# Patient Record
Sex: Male | Born: 1940
Health system: Southern US, Community
[De-identification: ages and names within clinical notes are randomized; demographics above are authoritative.]

## PROBLEM LIST (undated history)

## (undated) DIAGNOSIS — N529 Male erectile dysfunction, unspecified: Secondary | ICD-10-CM

## (undated) DIAGNOSIS — E119 Type 2 diabetes mellitus without complications: Secondary | ICD-10-CM

## (undated) DIAGNOSIS — E785 Hyperlipidemia, unspecified: Secondary | ICD-10-CM

## (undated) DIAGNOSIS — I10 Essential (primary) hypertension: Secondary | ICD-10-CM

## (undated) DIAGNOSIS — H409 Unspecified glaucoma: Secondary | ICD-10-CM

## (undated) HISTORY — DX: Male erectile dysfunction, unspecified: N52.9

## (undated) HISTORY — DX: Unspecified glaucoma: H40.9

## (undated) HISTORY — DX: Hyperlipidemia, unspecified: E78.5

## (undated) HISTORY — DX: Type 2 diabetes mellitus without complications: E11.9

## (undated) HISTORY — PX: LEG AMPUTATION ABOVE KNEE: SHX117

## (undated) HISTORY — DX: Essential (primary) hypertension: I10

---

## 1999-10-04 ENCOUNTER — Encounter (INDEPENDENT_AMBULATORY_CARE_PROVIDER_SITE_OTHER): Payer: Self-pay

## 1999-10-04 ENCOUNTER — Inpatient Hospital Stay (HOSPITAL_COMMUNITY): Admission: EM | Admit: 1999-10-04 | Discharge: 1999-10-22 | Payer: Self-pay | Admitting: Internal Medicine

## 1999-10-04 ENCOUNTER — Encounter: Payer: Self-pay | Admitting: Internal Medicine

## 1999-10-13 ENCOUNTER — Encounter: Payer: Self-pay | Admitting: Internal Medicine

## 1999-11-29 ENCOUNTER — Encounter
Admission: RE | Admit: 1999-11-29 | Discharge: 2000-02-22 | Payer: Self-pay | Admitting: Physical Medicine and Rehabilitation

## 2001-09-05 ENCOUNTER — Encounter (HOSPITAL_BASED_OUTPATIENT_CLINIC_OR_DEPARTMENT_OTHER): Admission: RE | Admit: 2001-09-05 | Discharge: 2001-12-04 | Payer: Self-pay | Admitting: Internal Medicine

## 2001-09-18 ENCOUNTER — Encounter: Payer: Self-pay | Admitting: *Deleted

## 2001-09-19 ENCOUNTER — Ambulatory Visit (HOSPITAL_COMMUNITY): Admission: RE | Admit: 2001-09-19 | Discharge: 2001-09-19 | Payer: Self-pay | Admitting: *Deleted

## 2001-09-27 ENCOUNTER — Encounter (INDEPENDENT_AMBULATORY_CARE_PROVIDER_SITE_OTHER): Payer: Self-pay | Admitting: Specialist

## 2001-09-27 ENCOUNTER — Encounter: Payer: Self-pay | Admitting: Vascular Surgery

## 2001-09-27 ENCOUNTER — Inpatient Hospital Stay (HOSPITAL_COMMUNITY): Admission: RE | Admit: 2001-09-27 | Discharge: 2001-10-06 | Payer: Self-pay | Admitting: Vascular Surgery

## 2001-10-02 ENCOUNTER — Encounter: Payer: Self-pay | Admitting: Vascular Surgery

## 2001-10-10 ENCOUNTER — Inpatient Hospital Stay (HOSPITAL_COMMUNITY): Admission: EM | Admit: 2001-10-10 | Discharge: 2001-10-18 | Payer: Self-pay | Admitting: Emergency Medicine

## 2001-10-10 ENCOUNTER — Encounter (INDEPENDENT_AMBULATORY_CARE_PROVIDER_SITE_OTHER): Payer: Self-pay | Admitting: *Deleted

## 2001-10-13 ENCOUNTER — Encounter: Payer: Self-pay | Admitting: Vascular Surgery

## 2002-04-08 ENCOUNTER — Encounter: Admission: RE | Admit: 2002-04-08 | Discharge: 2002-07-07 | Payer: Self-pay | Admitting: Vascular Surgery

## 2002-07-08 ENCOUNTER — Encounter: Admission: RE | Admit: 2002-07-08 | Discharge: 2002-09-03 | Payer: Self-pay | Admitting: Vascular Surgery

## 2009-01-19 ENCOUNTER — Ambulatory Visit: Payer: Self-pay | Admitting: Vascular Surgery

## 2010-10-04 NOTE — Consult Note (Signed)
NEW PATIENT CONSULTATION   Scott Reynolds, Scott Reynolds  DOB:  [birth year]                                       01/19/2009  ZOXWR#:[id number]   The patient is a 70 year old male patient well known to me having  previously undergone bilateral below knee amputations most recently on  the right side in 2003.  He was fit with bilateral prostheses which he  has worn over the years but now they are not fitted properly because of  a decreased size in his stumps and he needs adjustments made.  He denies  any neurologic symptoms as hemiparesis, aphasia, amaurosis fugax,  diplopia, blurred vision or syncope.  His diabetes mellitus has been  under good control by Dr. Lucianne Muss.  He also denies any cardiac symptoms  such as chest pain or dyspnea on exertion.   PHYSICAL EXAMINATION:  Blood pressure 166/74, heart rate 72,  respirations 14.  Carotid pulses are 3+.  No audible bruits.  Chest  clear to auscultation.  Cardiovascular exam regular rhythm.  No murmurs.  Lower extremity exam reveals 3+ femoral pulses but no popliteal or  distal pulses.  Both below knee amputation stumps are well-healed with  no evidence of flexion contracture and no skin breakdown.   He does need new below knee prostheses or adjustments and we will refer  him back to [facility] where he was fitted previously.  He will return to  see [place] on a p.r.n. basis.   Quita Skye Hart Rochester, M.D.  Electronically Signed   JDL/[id number]  D:  01/19/2009  T:  01/20/2009  Job:  [id number]

## 2010-10-07 NOTE — H&P (Signed)
[place]  Patient:    Scott Reynolds, Scott Reynolds Visit Number: [id number] MRN: [id number]          Service Type: MED Location: [phone] Attending Physician:  Caralee Ates Dictated by:   Tollie Pizza Collins, P.A.-C. Admit Date:  10/10/2001                           History and Physical  CHIEF COMPLAINT: Infected foot.  HISTORY OF PRESENT ILLNESS: The patient is a 70 year old white male, who is status post a right femoropopliteal bypass graft with a first toe amputation on Sep 27, 2001.  He did well postoperatively from the standpoint of his revascularization; however, his toe amputation site failed to heal and ultimately he required transmetatarsal amputation of the great toe, which was performed on Oct 02, 2001.  Following this procedure he recovered nicely and was discharged home on Oct 06, 2001.  He had done well since that time until yesterday, at which time he noted significant swelling and odor at the amputation site during his dressing change.  He states that his foot was quite painful and there was a moderate amount of foul smelling drainage coming from the site.  He denied any fever.  He was seen by the home health nurse and it was determined that he should be seen by a physician.  By the time this determination was made the CV/TS office was closed and therefore the patient presented to the ED for further evaluation and treatment.  PAST MEDICAL HISTORY:  1. Type 2 non-insulin dependent diabetes mellitus.  2. Hypertension.  3. Glaucoma.  4. Peripheral vascular disease.  5. Hyperlipidemia.  PAST SURGICAL HISTORY:  1. Right femoropopliteal blood pressure using nonreverse saphenous vein     on Sep 27, 2001 by Dr. Hart Rochester.  2. Right great toe amputation on Sep 27, 2001 by Dr. Hart Rochester.  3. Right transmetatarsal amputation of the great toe on Oct 02, 2001 by     Dr. Hart Rochester.  4. Left below-the-knee amputation in May 2001.  5. History of left eye  surgery of unknown type in the 1990s.  CURRENT MEDICATIONS:  1. Hydrochlorothiazide 25 mg q.d.  2. Amaryl 2 mg q.d.  3. Actos 45 mg q.d.  4. Lipitor 10 mg q.d.  5. Xalatan eye drops one drop in each eye q.h.s.  6. Cosopt one drop in each eye q.h.s.  7. Tylox one to two q.4h p.r.n. pain.  ALLERGIES: No known drug allergies.  FAMILY HISTORY: His mother died of coronary artery disease.  His father is also deceased and had Alzheimers disease.  He has one living brother who has diabetes mellitus.  SOCIAL HISTORY: He is married and has one son, who accompanies him to the ER today.  He denies any past or current history of alcohol or tobacco use.  REVIEW OF SYSTEMS: See History of Present Illness for pertinent positives. Also, he is diabetic and sugars stay fairly well controlled with oral hypoglycemic medications.  He is also status post a previous left below-the-knee amputation and ambulates with a prosthesis.  He denies weight loss, fever, chills, chest pain, shortness of breath, palpitations, dyspnea on exertion, cough, abdominal pain, nausea, vomiting, diarrhea, constipation, lower extremity edema other than of the foot which was noted yesterday, TIA symptoms, weakness, anorexia, anxiety, or depression.  PHYSICAL EXAMINATION:  VITAL SIGNS: Blood pressure 138/71, heart rate 100 (sinus tachycardia), temperature 98.1 degrees, respirations 18 and  unlabored.  GENERAL: This is a well-developed, well-nourished white male in no acute distress.  HEENT: Normocephalic, atraumatic.  PERRLA.  Slight irregularity of the left eye secondary to his prior surgery.  Examination of the external ears and nose revealed no abnormalities.  Oropharynx is clear with moist mucous membranes.  NECK: Supple without lymphadenopathy, thyromegaly, or carotid bruits.  HEART: Regular rate and rhythm without murmurs, rubs, or gallops.  LUNGS: Clear to auscultation.  ABDOMEN: Soft, nontender, nondistended,  with active bowel sounds in all quadrants.  No hepatosplenomegaly.  EXTREMITIES: He has a well-healed left below-the-knee amputation stump.  His right leg incisions are intact and dry, with staples present.  No erythema or drainage.  His right forefoot is warm to touch with mild erythema.  He has 1-2+ pedal edema.  His right transmetatarsal amputation site of the great toe is boggy to touch with soupy drainage.  There is a foul odor present.  The skin edges appear necrotic and are healing poorly.  The area is not tender and he does have intact sensation and motor function.  There is a strong dorsalis pedis Doppler signal as well as palpable femoral pulse.  LABORATORY DATA: CBC and basic metabolic panel are pending, although his WBC has returned and is 11,000.  ASSESSMENT/PLAN: This is a 70 year old white male, with a history of peripheral vascular disease and diabetes mellitus.  He is status post a recent right transmetatarsal amputation.  He now presents with a nonhealing infected amputation site.  He will be admitted for intravenous antibiotics and local wound care. Dictated by:   Tollie Pizza Collins, P.A.-C. Attending Physician:  Caralee Ates. DD:  10/10/01 TD:  10/11/01 Job: [id number]

## 2010-10-07 NOTE — Discharge Summary (Signed)
[place]. [facility]  Patient:    Scott Reynolds, Scott Reynolds Visit Number: [id number] MRN: [id number]          Service Type: SUR Location: 2000 2019 01 Attending Physician:  Colvin Caroli Dictated by:   Adair Patter, P.A. Admit Date:  09/27/2001 Discharge Date: 10/06/2001                             Discharge Summary  DATE OF BIRTH:  January 18, 1931  ADMITTING DIAGNOSIS:  Peripheral vascular disease.  SECONDARY DIAGNOSIS:  Non-insulin-dependent diabetes mellitus.  DISCHARGE DIAGNOSIS:  Peripheral vascular disease.  HOSPITAL COURSE:  Mr. Dorko was admitted to [facility] on Sep 27, 2001 secondary to severe peripheral vascular disease.  On this date, he underwent a right femoral-to-below-knee popliteal bypass graft and amputation of right great toe.  No complications were noted during procedure. Postoperatively, the patient had an ankle brachial index performed on his right side which revealed he had improved blood flow to his right lower extremity.  His amputation site remained clean and healing well.  He had a Penrose drain in place, which was removed on postoperative day #2.  His incisions were healing well.  He began ambulating with physical therapy and was subsequently deemed stable for discharge home on Oct 06, 2001.  MEDICATIONS AT TIME OF DISCHARGE: 1. Tylox one to two tablets every four to six hours as needed for pain. 2. Hydrochlorothiazide 25 mg one daily. 3. Amaryl 2 mg one daily. 4. Xalatan eye drops. 5. Cosopt eye drops. 6. Actos 45 mg one daily. 7. Lipitor 10 mg one daily.  ACTIVITY:  The patient was to avoid driving, strenuous activity or lifting heavy objects.  He is told to walk daily and continue breathing exercises.  DIET DIET:  An 1800-calorie ADA diet.  WOUND CARE:  The patient was told he could shower and clean his incision with soap and water.  He will have a visiting nurse come to his home to help with dressing  changes.  DISPOSITION:  Home.  FOLLOWUP:  The patient will see Dr. Quita Skye. Hart Rochester in about two weeks; he will also call and verify the time of this appointment. Dictated by:   Adair Patter, P.A. Attending Physician:  Colvin Caroli DD:  10/05/01 TD:  10/08/01 Job: [id number]

## 2010-10-07 NOTE — H&P (Signed)
[place]. [facility]  Patient:    Scott Reynolds, Scott Reynolds Visit Number: [id number] MRN: [id number]          Service Type: FTC Location: FOOT Attending Physician:  Sharren Bridge Dictated by:   Dominica Severin, P.A. Admit Date:  09/05/2001   CC:         CVTS Office  Robert E. Cheryll Cockayne, M.D.   History and Physical  DATE OF BIRTH:  29-Feb-1941.  CHIEF COMPLAINT: Right peripheral vascular occlusive disease and gangrene of the right first toe.  HISTORY OF PRESENT ILLNESS:  This is a 70 year old African-American male with non-insulin dependent diabetes mellitus with a history of peripheral vascular occlusive disease status post left below knee amputation in May 2001.  In December 2002, he had developed right lower extremity sores on the toes which have not healed.  It has been treated with Accuzyme. There is no evidence of infection but it has not been healing properly and angiogram was done and confirms the diagnosis for peripheral vascular occlusive disease.  He is scheduled for a right femoral popliteal bypass graft on Sep 27, 2001.  The patient denies any buttock, hip, thigh or calf pain.  He does describe some foot pain.  He denies any rest pain although he does have some night pain occasionally.  He does have some slow healing ulcers.  He has a gangrenous right great toe and some ischemic changes.  He does have some occasional peripheral edema and denies any decrease in temperature in the foot.  He denies any shortness of breath or dyspnea with exertion after walking.  He denies any chest pain, palpitations or coronary artery disease.  PAST MEDICAL HISTORY: 1. Peripheral vascular occlusive disease. 2. Hypertension. 3. Glaucoma. 4. Type 2 diabetes mellitus.  PAST SURGICAL HISTORY: 1. A left below the knee amputation May 2001. 2. Left eye surgery in 1990s.  MEDICATIONS: 1. Amaryl 2 mg p.o. q. daily. 2. Glucophage 500 mg p.o. q. daily. 3.  Lipitor 40 mg p.o. q. daily. 4. Hydrochlorothiazide 25 mg p.o. q. day. 5. Xalatan eye drops 1 drop both eyes q.h.s. 6. Cosopt 1 drop both eyes q.h.s. 7. Actos 45 mg p.o. q. daily.  NOTE:  These medication list needs to be confirmed at the time of admission secondary to patient did not bring his accurate list with him to the office today.  ALLERGIES:  No known drug allergies.  REVIEW OF SYSTEMS:  Please see HPI for significant positives, otherwise patient denies any history of chronic obstructive pulmonary disease, DVT, pulmonary embolism, thrombophlebitis, diabetes, kidney disease, asthma.  He denies any history of amaurosis fugax, cancer, coronary artery disease, congestive heart failure, arrhythmias, stroke or TIAs.  FAMILY HISTORY:  The patients mother is deceased age 24 and father is deceased in his 39s from Alzheimers.  Mother is deceased from coronary artery disease.  He has a brother who has diabetes mellitus.  SOCIAL HISTORY: The patient is married for 41 years.  Has 1 son, lives in [place].  He is retired.  He used to work in trucking and loading.  Denies any alcohol or tobacco use ever.  PHYSICAL EXAMINATION:  VITAL SIGNS: Blood pressure 136/70, pulse 72, respirations 16.  GENERAL:  He is a 70 year old African-American male who is in no acute distress.  He is alert, oriented x 3.  HEENT:  Head is normocephalic and atraumatic.  Eyes:  Pupils, equal, round, reactive to light and accommodation, EOMI without cataracts, glaucoma or macular degeneration.  NECK:  Supple without JVD, bruits or lymphadenopathy.  CHEST:  Symmetrical on inspiration.  LUNGS:  Without wheezes, rhonchi, rales or lymphadenopathy.  HEART:  Regular rate and rhythm without murmurs, gallops, or rubs.  ABDOMEN:  Soft, nontender, with positive bowel sounds x 4 quadrants without masses or bruits.  GENITOURINARY AND RECTAL: Deferred.  EXTREMITIES:  Without clubbing, cyanosis, or edema.  He has a  left lower extremity with a healed stump and a functional prosthesis.  There is no cellulitis.  He does have ulcerations on his right lower extremity on the lateral aspect which is eschar measuring 5 x 2 cm and dry gangrene of his right first toe.  Temperature is decreased slightly.  PULSES:  Carotids are 3+ bilaterally, femoral 3+ bilaterally.  There is no popliteal or pedal pulses bilaterally palpated at this time.  NEUROLOGIC:  There is no focal deficits.  He has a somewhat unsteady gait secondary to his prosthesis.  Deep tendon reflexes and muscle strength are 2+ bilaterally.  ASSESSMENT AND PLAN:  Right peripheral vascular occlusive disease for a right fem-pop bypass graft with saphenous vein on Sep 27, 2001, as well as a possible right great toe amputation to be determined on that morning by Dr. Hart Rochester. Dr. Hart Rochester has seen and evaluated this patient prior to this admission and has explained the risks and benefits of the procedure and the patient has agreed to continue. Dictated by:   Dominica Severin, P.A. Attending Physician:  Sharren Bridge DD:  09/25/01 TD:  09/26/01 Job: [id number]

## 2010-10-07 NOTE — Procedures (Signed)
[place]. [facility]  Patient:    Scott Reynolds, Scott Reynolds Visit Number: [id number] MRN: [id number]          Service Type: DSU Location: [facility] 2899 [id number] Attending Physician:  Caralee Ates Dictated by:   Caralee Ates, M.D. Proc. Date: 09/19/01 Admit Date:  09/19/2001 Discharge Date: 09/19/2001   CC:         CVTS Office  [facility] Lab - 6th Floor   Procedure Report  PREOPERATIVE DIAGNOSIS: Right lower extremity ischemia.  PROCEDURE: Right lower extremity angiogram.  SURGEON: Caralee Ates, M.D.  ACCESS: Right common femoral artery (#5 French sheath).  TOTAL CONTRAST: 130 cc of Isopaque.  TOTAL FLUOROSCOPY TIME: 5 minutes and 18 seconds.  COMPLICATIONS: None.  DESCRIPTION OF PROCEDURE: The patient was brought to the cath lab and placed in the cath lab in supine position. Following adequate IV sedation the groins were prepped and draped in sterile fashion. The right femoral head was located under fluoroscopy and the overlying skin was anesthetized with 1% Lidocaine. A small stab incision was made. A tract was developed down to the level of the common femoral artery bluntly. The right common femoral artery was then percutaneously punctured and a #5 French sheath placed. Retrograde injection of contrast to the sheath was used to opacify the iliac system and then serial images of the right lower extremity were obtained from the groin to the foot. Once satisfactory images had been obtained the procedure was terminated and the sheath was removed and direct pressure held until hemostasis was achieved. The patient was then transferred to the recovery area in stable condition. The patient tolerated the procedure well. There were no complications.  FINDINGS: 1. The common internal and external iliac arteries are widely patent with no    evidence of atherosclerotic narrowing. 2. The SFA and profunda are widely patent at their origins. However, the SFA    is occluded at  [facility] canal. 3. Popliteal artery reconstitutes just above the level of the knee and is    patent throughout the remainder of its course. 4. There is single vessel runoff to the foot via the peroneal artery. 5. The posterior tibial artery reconstitutes at the level of the ankle through    collaterals from the peroneal.  RECOMMENDATIONS: 1. Routine post cath care. 2. Will discuss the findings of this study with Dr. Hart Rochester, who is tentatively    planning for a femoral popliteal bypass next week for limb salvage. Dictated by:   Caralee Ates, M.D. Attending Physician:  Caralee Ates. DD:  09/19/01 TD:  09/22/01 Job: [id number]

## 2010-10-07 NOTE — Op Note (Signed)
[place]. [facility]  Patient:    Scott Reynolds, Scott Reynolds Visit Number: [id number] MRN: [id number]          Service Type: FTC Location: FOOT Attending Physician:  Sharren Bridge Dictated by:   Quita Skye Hart Rochester, M.D. Proc. Date: 09/26/01 Admit Date:  09/05/2001                             Operative Report  PREOPERATIVE DIAGNOSIS: Right femoropopliteal and tibial occlusive disease with gangrene of first toe, right foot.  POSTOPERATIVE DIAGNOSIS: Right femoropopliteal and tibial occlusive disease with gangrene of first toe, right foot.  OPERATION/PROCEDURE:  1. Right proximal superficial femoral to below-the-knee popliteal bypass     using a nonreverse translocated saphenous vein graft from right leg with     intraoperative arteriogram.  2. Right first toe amputation.  SURGEON: Quita Skye. Hart Rochester, M.D.  FIRST ASSISTANT: Di Kindle. Edilia Bo, M.D.  SECOND ASSISTANT: Loura Pardon, P.A.  ANESTHESIA: General endotracheal.  PROCEDURE: The patient was taken to the operating room and placed in the supine position, at which time satisfactory general endotracheal anesthesia was administered.  The right leg was prepped with Betadine scrubbing solution and draped in routine sterile manner, isolating the foot in a sterile isolation bag.  The saphenous vein was exposed at the saphenofemoral junction and removed down to the mid calf through multiple incisions along the medial aspect of the right leg.  Its branches were ligated with 4-0 and 5-0 silk ties and divided.  It was removed, gently dilated with heparinized saline, and marked for orientation purposes.  It was an adequate vein, being about 2.5-3 mm in size in its smallest areas.  The popliteal artery was exposed in a below-the-knee position where it was fairly soft vessel with some mild disease distally and no palpable pulse.  The superficial femoral artery was then exposed and through the proximal vein  harvesting incision about 8 cm distal to its origin, where it was widely patent and had an excellent pulse.  There was some mild disease.  A subfascial anatomic tunnel was created and the patient was heparinized.  The superficial femoral artery was then occluded proximally and distally, opened with a 15 blade, extended with Potts scissors.  The proximal end of the vein was spatulated and anastomosed end-to-side using continuous 6-0 Prolene.  The clamps were then released and there was an excellent pulse down to the first set of competent valves.  Using a retrograde valvulotome the valves were rendered incompetent with resultant excellent flow out of the distal end of the vein graft.  The vein was carefully delivered through the tunnel, the popliteal artery opened in a below-the-knee position with a 15 blade, extended with the Potts scissors, had good backbleeding. The vein was carefully measured and spatulated and anastomosed end-to-side using continuous 6-0 Prolene.  The vessel loops were then released and there was an excellent pulse in the vein graft and good Doppler flow. Intraoperative arteriogram revealed a widely patent distal anastomosis with runoff through the peroneal artery to the ankle.  The anterior tibial artery was occluded in the mid calf and the posterior tibial artery was totally occluded.  Adequate hemostasis was achieved.  After giving Protamine the wounds were closed with Vicryl in a subcuticular fashion with clips.  A sterile dressing was applied.  Attention was turned to the right foot, which was then exposed.  There was gangrene at the end of the right  first toe involving the entire nail bed.  A circumferential incision was made over the middle phalanx.  The distal interphalangeal joint was entered and disarticulated, and the specimen removed from the tab, the phalanx shortened with the rongeur.  There was fair bleeding coming from the skin edges and no evidence of  purulence.  The wound was closed with a few interrupted 3-0 Vicryl sutures and 3-0 nylon vertical mattress sutures for the skin.  A sterile dressing was applied and the patient taken to the recovery room in satisfactory condition. Dictated by:   Quita Skye Hart Rochester, M.D. Attending Physician:  Sharren Bridge DD:  09/27/01 TD:  09/29/01 Job: [id number]

## 2010-10-07 NOTE — Op Note (Signed)
[place]. [facility]  Patient:    BADER, STUBBLEFIELD Visit Number: [id number] MRN: [id number]          Service Type: MED Location: [phone] Attending Physician:  Caralee Ates Dictated by:   Di Kindle Edilia Bo, M.D. Proc. Date: 10/13/01 Admit Date:  10/10/2001                             Operative Report  PREOPERATIVE DIAGNOSIS:  Nonhealing right foot wound.  POSTOPERATIVE DIAGNOSIS:  Nonhealing right foot wound.  OPERATION PERFORMED:  Right below-knee amputation.  SURGEON:  Di Kindle. Edilia Bo, M.D.  ASSISTANT:  Nurse.  ANESTHESIA:  General.  DESCRIPTION OF PROCEDURE:  The patient was taken to the operating room and received a general anesthetic.  The right lower extremity was prepped and draped in the usual sterile fashion.  The circumference of the limb was measured 10 cm distal to the tibial tuberosity and two thirds of this distance was used to mark an anterior skin flap.  A long posterior flap of equal length was then marked.  Of note, this patient had a functioning bypass graft.  I therefore fully heparinized the patient with 10,000 units of IV heparin. After adequate circulation time I exsanguinated the leg with an Esmarch bandage, inflated the tourniquet to 250 mHg.  Under tourniquet control, the incision was carried down through the skin and subcutaneous tissue, muscle and fascia to the tibia and fibula which were dissected free circumferentially. The tibia was divided proximal to the level of skin division and the anterior aspect of the bone was beveled.  The fibula was also divided proximally.  The specimen was removed.  The arteries and veins were individually suture ligated with 2-0 silk ties and then the tourniquet was released.  The tourniquet time was 10 minutes.  Next, the heparin was reversed with 100 mg of protamine. Hemostasis was obtained using additional 2-0 silk ligatures and electrocautery.  The wound was irrigated  with copious amounts of antibiotic solution.  The fascial layer was then closed with interrupted 2-0 Vicryl sutures.  The skin was closed with staples.  A sterile dressing was applied. The patient tolerated the procedure well and was transferred to the recovery room in satisfactory condition.  All needle and sponge counts were correct. Dictated by:   Di Kindle Edilia Bo, M.D. Attending Physician:  Caralee Ates. DD:  10/13/01 TD:  10/15/01 Job: [id number]

## 2010-10-07 NOTE — Discharge Summary (Signed)
[place]  Patient:    BRANNON, LEVENE Visit Number: [id number] MRN: [id number]          Service Type: MED Location: [phone] Attending Physician:  Caralee Ates Dictated by:   Lissa Merlin, P.A. Admit Date:  10/10/2001 Discharge Date: 10/18/2001   CC:         CVTS Office   Discharge Summary  DATE OF BIRTH:  28-Apr-1941  ADMISSION DIAGNOSIS:  Nonhealing infected right foot transmetatarsal amputation.  FINAL DIAGNOSIS:  Nonhealing infected right foot transmetatarsal amputation.  PAST MEDICAL HISTORY: 1. Right transmetatarsal amputation first toe on Oct 02, 2001 by Dr. Hart Rochester. 2. Right femoral popliteal bypass graft with nonreversed saphenous vein    on Sep 27, 2001 by Dr. Hart Rochester. 3. Recent right great toe amputation by Dr. Hart Rochester. 4. Type 2 non-insulin-dependent diabetes. 5. Hypertension. 6. Peripheral vascular disease. 7. Hyperlipidemia. 8. Glaucoma. 9. Eye surgery.  PROCEDURES:  Right below the knee amputation on Oct 13, 2001 by Dr. Waverly Ferrari.  BRIEF HISTORY:  The patient is a 70 year old black male with recent surgeries by Dr. Hart Rochester beginning with right femoral popliteal bypass graft and great toe amputation on Sep 27, 2001.  After the procedure, the toe amputation site was noted to be healing poorly. It ultimately required a transmetatarsal amputation on Oct 02, 2001.  He was discharged home on Oct 06, 2001 and did well until he was readmitted on Oct 10, 2001 with a nonhealing foul smelling transmetatarsal amputation site.  The Home Health nurse noticed the wound looking poorly and he was brought into the emergency room.  HOSPITAL COURSE:  He was evaluated by Dr. Elyn Peers and treated with local wound care, pulse lavage and antibiotics.  After talking with Dr. Edilia Bo, Mr. Wrightson decided he would agree with a right below the knee amputation since this was the best option for him.  He underwent the procedure on Oct 13, 2001 by Dr. Edilia Bo.  There were no complications.  He was taken to the recovery room in stable condition.  Postoperative he did well.  He felt much better. His wound looked very well.  He began working with rehab making good progress. He had mild anemia for which he was put on iron supplements.  On Oct 18, 2001 he fell very well.  His wound was healing well.  He was discharged home.  DISCHARGE MEDICATIONS:  He was told to resume his home medicines.  In addition he was given Tylox one to two every four to six hours p.r.n. for pain and Niferex 150 p.o. q.d.  SPECIAL INSTRUCTIONS:  He was told to avoid strenuous activity and to be very careful with his incision and to clean it daily with soap and water and call the office if he noticed any problems with it.  CONDITION ON DISCHARGE:  Stable.  DISPOSITION:  Home.  FOLLOW-UP:  Dr. Hart Rochester on Tuesday, October 29, 2001 at 9:40 a.m. Dictated by:   Lissa Merlin, P.A. Attending Physician:  Caralee Ates. DD:  10/31/01 TD:  11/02/01 Job: [id number]

## 2010-10-07 NOTE — Discharge Summary (Signed)
[facility]  Patient:    Scott Reynolds, Scott Reynolds                       MRN: [id number] Adm. Date:  [id number] Disc. Date: [id number] Attending:  Meredith Leeds CC:         Anselm Pancoast. Zachery Dakins, M.D.             Zigmund Daniel, M.D.                           Discharge Summary  DATE OF BIRTH:  2040-11-24  DISCHARGE DIAGNOSES: 1. 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 2. Newly diagnosed type 2 diabetes. 3. New onset hypertension. 4. Glaucoma.  PROCEDURES: 1. Amputation of left fourth and fifth digits on Oct 05, 1999. 2. Left below-knee amputation on Oct 15, 1999. 3. Lower arterial Dopplers which indicated biphasic wave form on the left in    the perineal, dorsalis pedis, and posterior tibial.  CONSULTS: 1. Anselm Pancoast. Zachery Dakins, M.D., general surgery. 2. Zigmund Daniel, M.D., general surgery.  HISTORY OF PRESENT ILLNESS:  The patient is a 70 year old male who was seen for the first time on Oct 04, 1999, at [facility], coming in with foul, black left fourth and fifth digits.  He was admitted to [facility] for further evaluation.  HOSPITAL COURSE:  #1 - Upon further evaluation, the patient was noted to be diabetic with a hemoglobin A1C of 11.8.  He was also noted to be hypertensive. A consult was gained with Anselm Pancoast. Zachery Dakins, M.D., who proceed to take the patient to surgery on Oct 05, 1999, for a left amputation of the fourth and fifth digits.  Cultures of the wound and bone indicated a methacillin-resistant Staphylococcus aureus osteomyelitis.  The patient was placed on vancomycin for a total of a 10-day course.  The patients progress was slow with poor granulation tissue despite whirlpool attempts.  There was further necrosis of the amputation site area.  Arterial Dopplers indicated that the patient had biphasic flow in the left lower extremity.  It  was decided that light of the poor healing and likely further deterioration, the patient would require a left BKA.  Zigmund Daniel, M.D., was called in a performed procedure on Oct 15, 1999.  The patient subsequently went on to show good healing in the stump site without further complication.  The patient did spike a temperature during the course of his hospital stay.  All blood cultures and urine cultures to date have been negative.  Chest x-ray showed plate-like atelectasis.  ______ was started.  Despite negative cultures, he continued to spike temperature.  It was felt this might be drug related. After a 10-day course of vancomycin, all antibiotics were discontinued and the patient defervesced and remained afebrile at discharge.  #2 - NEW ONSET DIABETES:  The patient was started on Glucophage, Amaryl, and Actos.  His sugars remained elevated.  He was started on NPH Insulin at nighttime.  After the amputation of his foot and then his legs, his sugars began to become under control with his sugars running in the mid 100s at the time of discharge.  His discharge regimen will include Actos 45 mg, Glucophage 1000 mg b.i.d., and Amaryl 4 mg.  #3 - HYPERTENSION:  The patient was started on hydrochlorothiazide and Altace 2.5 mg.  The blood  pressure was under control and stable and at goal at discharge.  #4 - GLAUCOMA:  The patient has a history of glaucoma.  His home medications were started and he will continue this at home.  FOLLOW-UP:  The patient is to follow up with Kern Reap, M.D., in 10-14 days for management of hypertension and diabetes.  He is to follow up with Zigmund Daniel, M.D., in 10-14 days to assess the surgical site and be fitted and outfitted for a prosthetic device.  ACTIVITY:  PT and OT have seen the patient.  He will continue his physical therapy as an outpatient.  Durable medical goods have been called for and he will be able to go home with a three-in-one  seat, wheelchair, and a walker.  DISCHARGE MEDICATIONS: 1. Actos 45 mg p.o. q.d. 2. Amaryl 4 mg p.o. q.d. 3. Glucophage 1000 mg b.i.d. 4. Altace 2.5 mg p.o. q.d. 5. Hydrochlorothiazide 25 mg q.d. 6. Xalantan ophthalmic one drop to each eye q.d. 7. Timolol one drop to each eye b.i.d. DD:  10/22/99 TD:  10/26/99 Job: [id number]

## 2010-10-07 NOTE — Op Note (Signed)
[place]. [facility]  Patient:    Scott Reynolds, Scott Reynolds Visit Number: [id number] MRN: [id number]          Service Type: SUR Location: 2000 2019 01 Attending Physician:  Colvin Caroli Dictated by:   Quita Skye Hart Rochester, M.D. Proc. Date: 10/02/01 Admit Date:  09/27/2001 Discharge Date: 10/06/2001                             Operative Report  PREOPERATIVE DIAGNOSIS:  Nonhealing right first toe amputation.  POSTOPERATIVE DIAGNOSIS:  Nonhealing right first toe amputation secondary to tibial occlusive disease and diabetes mellitus.  PROCEDURE:  Transmetatarsal right first toe amputation.  SURGEON:  Quita Skye. Hart Rochester, M.D.  ANESTHESIA:  General endotracheal.  DRAINS:  One Penrose.  DESCRIPTION OF PROCEDURE:  Patient was taken to the operating room and placed in a supine position, at which time satisfactory general anesthesia was administered.  The right foot was prepped with Betadine scrubbing solution and draped in routine sterile manner.  A simple amputation of the right first toe had been performed several days earlier and there was gangrene circumferentially in the skin but the base of the toe looked relatively healthy.  A circumferential tennis racket-type incision was made around the base of the right first toe and extended down the first metatarsal bone for a distance of about 6 cm.  Incision was carried down to the bone and the bone was cleaned proximally with a periosteally elevator, divided with the bone cutter and the specimen removed from the table.  The bone was shortened with a rongeur.  There was some marginal-looking tissue posteriorly around one of the tendon sheaths and this was debrided as much as possible and any nonviable skin was trimmed away.  After thorough irrigation, a Penrose drain was brought out through the inferior aspect of the wound and the wound was closed loosely with interrupted 3-0 Vicryl and interrupted 3-0 nylon sutures,  sterile dressing applied consisting of Adaptic, 4 x 4s, Kerlix and an Ace wrap and the patient was taken to the recovery room in satisfactory condition.Dictated by:   Quita Skye Hart Rochester, M.D. Attending Physician:  Colvin Caroli DD:  10/02/01 TD:  10/03/01 Job: [id number]

## 2010-10-07 NOTE — Op Note (Signed)
[place]  Patient:    Scott Reynolds, Scott Reynolds                       MRN: [id number] Proc. Date: 10/05/99 Adm. Date:  [id number] Attending:  Olene Craven CC:         Anselm Pancoast. Zachery Dakins, M.D.             Kern Reap, M.D.                           Operative Report  PREOPERATIVE DIAGNOSIS:  Gangrene, left fifth toe with cellulitis metatarsal; status post bunionectomy.  POSTOPERATIVE DIAGNOSIS:  Gangrene, left fifth metatarsal and infection of the fourth MP joint.  OPERATION:  Debridement amputation fourth transmetatarsal, fifth metatarsal amputation.  Wound was left open.  SURGEON:  Anselm Pancoast. Zachery Dakins, M.D.  ANESTHESIA:  General.  ASSISTANT:  Nurse.  HISTORY:  Scott Reynolds is a 70 year old black insulin-dependent diabetic, who about six weeks ago had a bunionectomy by podiatrist.  The patient had problems since then, and was admitted yesterday by his medical doctor with elevated glucose; frank, foul-smelling, purulent drainage from the wound; and cellulitis located around the MP joint.  The wound itself was open, with drainage; no x-rays had been obtained.  I was asked to see the patient; seeing him at approximately 11 p.m. last night.  On examination, he has a good dorsalis pedis pulse.  He obviously has an active infection and had eaten supper.  I sent him down for an x-ray that showed infection of the MP joint area, no other obvious infection on the fourth MP joint area.  We scheduled him for amputation debridement at this early a.m.  The patient stated to do what was necessary, hopefully to be able to control the infection inside the foot.  DESCRIPTION OF PROCEDURE:  The patient was taken to the operating room.  Dr. Almeta Monas, anesthesiologist, placed endotracheal tube position.  The patient had received Zosyn shortly before going to the operating room.  I made an incision between the fourth and fifth toes, down through the  inner metatarsal head area.  Then, elevated the mid toe, grasping the head of the fifth metatarsal.  The infection really went right on up to within about 1 cm of the actual proximal metatarsal area.  I elected to go ahead and completely excise the bone and the articulatement, using a chisel in the proximal fibrous attachments.  Unless you had done this you were not going to be out of the necrotic areas in the subcutaneous tissue.  The infection did, however, extend into the fourth joint space; the skin was viable but the underlying tissue was necrotic and still foul smelling.  I elected to do a fourth transmetatarsal amputation.  Transected the fourth metatarsal, in the mid-shaft area with a side cut-in.  Several vessels were clamped and ligated with Vicryl; all of the necrotic tissue was removed.  The skin flaps appear viable; we trimmed them some so they will come together easily.  Because of the active infection, I made no attempt to close the area at this time; packed it with saline, weak Betadine solution, and then a double Kerlix. Will keep the foot elevated.  Change the dressing this evening.  Hopefully we will be able to curtail the infection and not have to progress to a below-the-knee amputation.  The patients insulin is on sliding scale.  It was  about 220 this morning. DD:  10/05/99 TD:  10/08/99 Job: [id number]

## 2010-10-07 NOTE — Consult Note (Signed)
[facility]  Patient:    Scott Reynolds, Scott Reynolds Visit Number: [id number] MRN: [id number]          Service Type: FTC Location: FOOT Attending Physician:  Sharren Bridge Dictated by:   Jonelle Sports Cheryll Cockayne, M.D. Proc. Date: 09/05/01 Admit Date:  09/05/2001   CC:         Kern Reap, M.D.  CVTS   Consultation Report  HISTORY OF PRESENT ILLNESS:  This 70 year old white male comes referred by [employer] for evaluation of a lesion on the tip of the right hallux and another on the lateral aspect of the right lower extremity.  The patient has had diabetes first diagnosed two years ago but obviously more long-standing than that, with the diagnosis having been made at the time that he developed gangrene secondary to infection and required BKA amputation on the left.  He says that his diabetes currently is in pretty good control, but he has had a great deal of difficulty now since February with the right lower extremity.  This consists of a nonhealing lesion, which has become progressively more discolored on the tip of the right hallux which he says was initiated by dropping a piece of firewood on that foot.  In addition, along about the same time there spontaneously appeared two small dumbbell-type scabbed-over lesions distally on the lateral aspect of the right lower extremity.  He was treated with these just with Neosporin ointment, and they have gotten progressively larger.  He has similarly been using Neosporin on the lesion at the end of the toe, but again with no improvement.  He comes here now for our evaluation and advice regarding these problems.  PAST MEDICAL HISTORY:  Noteworthy also for hypertension and hyperlipidemia.  ALLERGIES:  He has no known medicinal allergies.  MEDICATIONS:  His regular medications include Amaryl 2 mg per day, Actos 45 mg per day, Glucophage 1000 mg b.i.d., Lipitor 40 mg daily, hydrochlorothiazide 25 mg daily, and  glaucoma drops.  PHYSICAL EXAMINATION:  Examination today is limited to the right lower extremity.  The left lower extremity ends in a BKA with corrective prosthesis in place and with no apparent problems with that extremity at this time.  The right lower extremity is softly edematous from the knee downward with no palpable pulses and with no significant signal obtained by Doppler examination.  Monofilament testing shows that he does have sensation present. Temperatures in that foot are 83 degrees on the plantar aspect, 84 on the dorsum of the foot.  At the tip of the right hallux is a black eschar measuring 1-3/4 x 1 inch, I do not know why this was not measured in millimeters, and there is no associated drainage.  The nail of that toe is separated from the nail bed, is nonviable, and the nail bed itself is a bit soupy and without deep infection.  On the lateral aspect of the right lower extremity approximately 10 cm proximal to the lateral malleolus is a dumbbell-shaped lesion with two eschars measuring 1-3/4 x 7/8 inches, again not measured, unfortunately, in millimeters, with no evidence of deep infection and no drainage.  DISPOSITION: 1. The patient and his wife are given instruction regarding foot care and    diabetes by video with nurse and physician reinforcement. 2. It was emphasized to the patient the importance of keeping his diabetes    in good control. 3. An effort is made by tugging to remove the nail of the right hallux, but  this is considerably uncomfortable to the patient and so the nail is    simply clipped off near its base.  The wound bed is then cleansed and    appears reasonably healthy and not deeply infected.  The wound on the tip    of the right hallux is debrided partial-thickness through removal of some    adjacent callus and by cross-hatching of the eschar. 4. The wounds on the lateral aspect of the right lower extremity are    debrided by trimming away  some adjacent crust and by cross-hatching both    areas. 5. The patient is given Accuzyme ointment to apply to both areas, to be    covered by an absorptive dressing. 6. Referral is made for the patient to be seen by the vascular surgeons for    the physician evaluation as well as appropriate vascular studies. 7. Further follow-up in this clinic will be dependent on the therapy provided    by the vascular surgeons. Dictated by:   Jonelle Sports Cheryll Cockayne, M.D. Attending Physician:  Sharren Bridge DD:  09/05/01 TD:  09/06/01 Job: [phone]

## 2010-10-07 NOTE — Op Note (Signed)
[facility]  Patient:    Scott Reynolds, Scott Reynolds                       MRN: [id number] Proc. Date: 10/15/99 Adm. Date:  [id number] Attending:  Meredith Leeds                           Operative Report  PREOPERATIVE DIAGNOSIS:  Gangrene and infection, left foot.  POSTOPERATIVE DIAGNOSIS:  Gangrene and infection, left foot.  OPERATION PERFORMED:  Left below-the-knee amputation.  SURGEON:  Zigmund Daniel, M.D.  ANESTHESIA:  General.  DESCRIPTION OF PROCEDURE:  After adequate general anesthesia and routine preparation and draping of the left lower extremity excluding the foot and an impervious stockinette. I made a transverse skin incision about 3/4 of the way around the leg, a full hand breadth below the lower extent of the patella. I deepened the dissection through the subcutaneous tissues and right down to the tibia and through the muscle fascia at the level of the skin cut all the way around. I ligated and divided the long saphenous vein and as I continued my dissection I got hemostasis on the smaller vessels with cautery and on large vessels with 2-0 Vicryl ligatures. I divided the anterior compartment muscles exposing the tibia and fibula and some of the medial compartment muscles so that I could get good access to the posterior aspect of the tibia and divided the interosseous membrane. I freed up the periosteum of the tibia anteriorly for about 2 cm and then divided it at the level of the skin incision transversely with the giggly saw angling the cut cephalad in the anterior portion. I smoothed that with the rongeur. I freed up the periosteum of the fibula for about 4 cm proximal to the cut on the tibia and I divided it with a large rib shear. The cut was smooth. I then divided further posterior muscles and vessels individually ligating all of the large trifurcation arteries and the deep calf veins. I individually ligated and divided the tibial  nerve pulling it down and ligating it quite high. I then completed the amputation by creating a thin muscular subcutaneous skin flap posteriorly adequate length to cover the surgical defect. I thoroughly irrigated the area and got good hemostasis. I brought the flap forward and shortened it to necessary length and sutured it anterior to posterior with a combination of interrupted and running 2-0 Vicryl suture. I closed with skin with staples. I applied a bulky compressive bandage. He was stable through the operation. DD:  10/15/99 TD:  10/19/99 Job: [id number]

## 2012-12-12 ENCOUNTER — Other Ambulatory Visit: Payer: Self-pay

## 2012-12-15 ENCOUNTER — Other Ambulatory Visit: Payer: Self-pay | Admitting: Endocrinology

## 2012-12-15 DIAGNOSIS — E119 Type 2 diabetes mellitus without complications: Secondary | ICD-10-CM | POA: Insufficient documentation

## 2012-12-15 DIAGNOSIS — N182 Chronic kidney disease, stage 2 (mild): Secondary | ICD-10-CM

## 2012-12-15 DIAGNOSIS — E7800 Pure hypercholesterolemia, unspecified: Secondary | ICD-10-CM

## 2012-12-15 DIAGNOSIS — E1351 Other specified diabetes mellitus with diabetic peripheral angiopathy without gangrene: Secondary | ICD-10-CM | POA: Insufficient documentation

## 2012-12-16 ENCOUNTER — Other Ambulatory Visit: Payer: Self-pay

## 2012-12-16 ENCOUNTER — Ambulatory Visit: Payer: Self-pay | Admitting: Endocrinology

## 2012-12-17 ENCOUNTER — Ambulatory Visit (INDEPENDENT_AMBULATORY_CARE_PROVIDER_SITE_OTHER): Payer: MEDICARE | Admitting: Endocrinology

## 2012-12-17 ENCOUNTER — Encounter: Payer: Self-pay | Admitting: Endocrinology

## 2012-12-17 VITALS — BP 124/64 | HR 55 | Temp 98.1°F | Resp 12

## 2012-12-17 DIAGNOSIS — IMO0001 Reserved for inherently not codable concepts without codable children: Secondary | ICD-10-CM

## 2012-12-17 DIAGNOSIS — N182 Chronic kidney disease, stage 2 (mild): Secondary | ICD-10-CM

## 2012-12-17 LAB — HEMOGLOBIN A1C: Hgb A1c MFr Bld: 5.5 % (ref 4.6–6.5)

## 2012-12-17 LAB — LIPID PANEL
LDL Cholesterol: 65 mg/dL (ref 0–99)
Total CHOL/HDL Ratio: 3
VLDL: 10.2 mg/dL (ref 0.0–40.0)

## 2012-12-17 LAB — CBC WITH DIFFERENTIAL/PLATELET
Basophils Relative: 0.4 % (ref 0.0–3.0)
Eosinophils Relative: 1.9 % (ref 0.0–5.0)
Lymphocytes Relative: 19.1 % (ref 12.0–46.0)
Monocytes Relative: 7 % (ref 3.0–12.0)
Neutrophils Relative %: 71.6 % (ref 43.0–77.0)
RBC: 4.55 Mil/uL (ref 4.22–5.81)
WBC: 5.5 10*3/uL (ref 4.5–10.5)

## 2012-12-17 LAB — COMPREHENSIVE METABOLIC PANEL
ALT: 11 U/L (ref 0–53)
AST: 15 U/L (ref 0–37)
Alkaline Phosphatase: 63 U/L (ref 39–117)
CO2: 27 mEq/L (ref 19–32)
GFR: 91.06 mL/min (ref >60.00)
Sodium: 137 mEq/L (ref 135–145)
Total Bilirubin: 1 mg/dL (ref 0.3–1.2)
Total Protein: 7.7 g/dL (ref 6.0–8.3)

## 2012-12-17 LAB — MICROALBUMIN / CREATININE URINE RATIO: Microalb, Ur: 0.5 mg/dL (ref 0.0–1.9)

## 2012-12-17 NOTE — Progress Notes (Signed)
Patient ID: Scott Reynolds, male   DOB: [birth year], 72 y.o.   MRN: [id number]  Reason for Appointment: Diabetes follow-up   History of Present Illness   He has had diabetes since 2001 which is usually well controlled.  Previously was taking glipizide and Actoplusmet but for some time has been able to get control with metformin only.   Overall had been very compliant  with his diet and he thinks he is losing weight.   Difficulties with medication adherence: financial limitations and inconsistent followup previously.  Monitors blood glucose: Less than once a day. Recently unable to get the strips because of insurance issues.   Glucometer: ?, Previously using Accu-Chek.  Blood Glucose readings: Does not check blood glucose levels.  Food preferences: low fat. Less eating out  Physical activity: exercise: some walking  The last HbgA1c was 5.8% in 3/14  Hypertension:    BP is Now controlled with losartan only. Not monitoring at home.     HYPERLIPIDEMIA:         The lipid abnormality consists of elevated LDL  he has been compliant with simvastatin   Office Visit on 12/17/2012  Component Date Value Range Status  . Microalb, Ur 12/17/2012 0.5  0.0 - 1.9 mg/dL Final  . Creatinine,U 91/47/8295 72.4   Final  . Microalb Creat Ratio 12/17/2012 0.7  0.0 - 30.0 mg/g Final  . Hemoglobin A1C 12/17/2012 5.5  4.6 - 6.5 % Final   Glycemic Control Guidelines for People with Diabetes:Non Diabetic:  <6%Goal of Therapy: <7%Additional Action Suggested:  >8%   . Sodium 12/17/2012 137  135 - 145 mEq/L Final  . Potassium 12/17/2012 4.1  3.5 - 5.1 mEq/L Final  . Chloride 12/17/2012 102  96 - 112 mEq/L Final  . CO2 12/17/2012 27  19 - 32 mEq/L Final  . Glucose, Bld 12/17/2012 104* 70 - 99 mg/dL Final  . BUN 62/13/0865 18  6 - 23 mg/dL Final  . Creatinine, Ser 12/17/2012 1.0  0.4 - 1.5 mg/dL Final  . Total Bilirubin 12/17/2012 1.0  0.3 - 1.2 mg/dL Final  . Alkaline Phosphatase 12/17/2012 63  39 - 117 U/L  Final  . AST 12/17/2012 15  0 - 37 U/L Final  . ALT 12/17/2012 11  0 - 53 U/L Final  . Total Protein 12/17/2012 7.7  6.0 - 8.3 g/dL Final  . Albumin 78/46/9629 4.3  3.5 - 5.2 g/dL Final  . Calcium 52/84/1324 9.6  8.4 - 10.5 mg/dL Final  . GFR 40/02/2724 91.06  >60.00 mL/min Final  . Cholesterol 12/17/2012 118  0 - 200 mg/dL Final   ATP III Classification       Desirable:  < 200 mg/dL               Borderline High:  200 - 239 mg/dL          High:  > = 366 mg/dL  . Triglycerides 12/17/2012 51.0  0.0 - 149.0 mg/dL Final   Normal:  <440 mg/dLBorderline High:  150 - 199 mg/dL  . HDL 12/17/2012 43.30  >39.00 mg/dL Final  . VLDL 34/74/2595 10.2  0.0 - 40.0 mg/dL Final  . LDL Cholesterol 12/17/2012 65  0 - 99 mg/dL Final  . Total CHOL/HDL Ratio 12/17/2012 3   Final                  Men          Women1/2 Average Risk     3.4  3.3Average Risk          5.0          4.42X Average Risk          9.6          7.13X Average Risk          15.0          11.0                      . WBC 12/17/2012 5.5  4.5 - 10.5 K/uL Final  . RBC 12/17/2012 4.55  4.22 - 5.81 Mil/uL Final  . Hemoglobin 12/17/2012 13.7  13.0 - 17.0 g/dL Final  . HCT 16/02/9603 41.0  39.0 - 52.0 % Final  . MCV 12/17/2012 90.2  78.0 - 100.0 fl Final  . MCHC 12/17/2012 33.5  30.0 - 36.0 g/dL Final  . RDW 54/01/8118 13.0  11.5 - 14.6 % Final  . Platelets 12/17/2012 162.0  150.0 - 400.0 K/uL Final  . Neutrophils Relative % 12/17/2012 71.6  43.0 - 77.0 % Final  . Lymphocytes Relative 12/17/2012 19.1  12.0 - 46.0 % Final  . Monocytes Relative 12/17/2012 7.0  3.0 - 12.0 % Final  . Eosinophils Relative 12/17/2012 1.9  0.0 - 5.0 % Final  . Basophils Relative 12/17/2012 0.4  0.0 - 3.0 % Final  . Neutro Abs 12/17/2012 3.9  1.4 - 7.7 K/uL Final  . Lymphs Abs 12/17/2012 1.0  0.7 - 4.0 K/uL Final  . Monocytes Absolute 12/17/2012 0.4  0.1 - 1.0 K/uL Final  . Eosinophils Absolute 12/17/2012 0.1  0.0 - 0.7 K/uL Final  . Basophils Absolute  12/17/2012 0.0  0.0 - 0.1 K/uL Final      Medication List       This list is accurate as of: 12/17/12 11:59 PM.  Always use your most recent med list.               bimatoprost 0.01 % Soln  Commonly known as:  LUMIGAN  1 drop at bedtime.     COMBIGAN 0.2-0.5 % ophthalmic solution  Generic drug:  brimonidine-timolol     losartan 50 MG tablet  Commonly known as:  COZAAR  50 mg.     metFORMIN 1000 MG tablet  Commonly known as:  GLUCOPHAGE  Take 1,000 mg by mouth 2 (two) times daily. 1 tablet twice daily     simvastatin 20 MG tablet  Commonly known as:  ZOCOR  20 mg.        Allergies: No Known Allergies  Past Medical History  Diagnosis Date  . Anemia   . Erectile dysfunction     Past Surgical History  Procedure Laterality Date  . Leg amputation above knee Bilateral     2001 left leg and 2003 right leg    Family History  Problem Relation Age of Onset  . Diabetes Brother     Social History:  reports that he has never smoked. He does not have any smokeless tobacco history on file. His alcohol and drug histories are not on file.  Review of Systems -   No history of shortness of breath Eye exam in 6/14   Examination:   BP 124/64  Pulse 55  Temp(Src) 98.1 F (36.7 C)  Resp 12  SpO2 97%  There is no height or weight on file to calculate BMI.   Assesment:   Diabetes type 2  The patient's diabetes control needs to be  assessed since he has not checked his blood sugars at home and A1c not available at the time of visit He is only on metformin and tolerating this well at 2000 mg a day Overall control has been much better with very healthy diet, he is trying to be as active as possible He will call for a glucose meter based on his insurance  HYPERTENSION: Blood pressure is excellent with only low-dose losartan  LIPIDS: To be checked   Phila Shoaf 12/18/2012, 1:33 PM

## 2012-12-17 NOTE — Patient Instructions (Addendum)
Please check blood sugars at least half the time about 2 hours after any meal and as directed on waking up. Please bring blood sugar monitor to each visit  Call if sugar > 150  Check BP weekly

## 2012-12-18 ENCOUNTER — Encounter: Payer: Self-pay | Admitting: Endocrinology

## 2012-12-30 ENCOUNTER — Other Ambulatory Visit: Payer: Self-pay | Admitting: *Deleted

## 2012-12-30 MED ORDER — METFORMIN HCL 1000 MG PO TABS: 1000.0000 mg | ORAL_TABLET | Freq: Two times a day (BID) | ORAL | Status: DC

## 2012-12-30 MED ORDER — LOSARTAN POTASSIUM 50 MG PO TABS: 50.0000 mg | ORAL_TABLET | Freq: Every day | ORAL | Status: DC

## 2012-12-30 MED ORDER — SIMVASTATIN 20 MG PO TABS: 20.0000 mg | ORAL_TABLET | Freq: Every day | ORAL | Status: DC

## 2013-04-22 ENCOUNTER — Encounter: Payer: Self-pay | Admitting: Endocrinology

## 2013-04-22 ENCOUNTER — Ambulatory Visit (INDEPENDENT_AMBULATORY_CARE_PROVIDER_SITE_OTHER): Payer: MEDICARE | Admitting: Endocrinology

## 2013-04-22 ENCOUNTER — Other Ambulatory Visit (INDEPENDENT_AMBULATORY_CARE_PROVIDER_SITE_OTHER): Payer: MEDICARE

## 2013-04-22 ENCOUNTER — Other Ambulatory Visit: Payer: Self-pay | Admitting: *Deleted

## 2013-04-22 VITALS — BP 124/70 | HR 67 | Temp 98.5°F | Resp 12 | Ht 68.0 in | Wt 219.7 lb

## 2013-04-22 DIAGNOSIS — S78119A Complete traumatic amputation at level between unspecified hip and knee, initial encounter: Secondary | ICD-10-CM

## 2013-04-22 DIAGNOSIS — Z23 Encounter for immunization: Secondary | ICD-10-CM

## 2013-04-22 DIAGNOSIS — Z89619 Acquired absence of unspecified leg above knee: Secondary | ICD-10-CM | POA: Insufficient documentation

## 2013-04-22 DIAGNOSIS — E7800 Pure hypercholesterolemia, unspecified: Secondary | ICD-10-CM

## 2013-04-22 DIAGNOSIS — IMO0001 Reserved for inherently not codable concepts without codable children: Secondary | ICD-10-CM

## 2013-04-22 DIAGNOSIS — E119 Type 2 diabetes mellitus without complications: Secondary | ICD-10-CM

## 2013-04-22 LAB — COMPREHENSIVE METABOLIC PANEL
ALT: 13 U/L (ref 0–53)
AST: 17 U/L (ref 0–37)
Albumin: 4.1 g/dL (ref 3.5–5.2)
Alkaline Phosphatase: 54 U/L (ref 39–117)
BUN: 17 mg/dL (ref 6–23)
Potassium: 4.3 mEq/L (ref 3.5–5.1)
Sodium: 137 mEq/L (ref 135–145)
Total Bilirubin: 1 mg/dL (ref 0.3–1.2)
Total Protein: 7.3 g/dL (ref 6.0–8.3)

## 2013-04-22 NOTE — Progress Notes (Signed)
Quick Note:  Please let patient know that the A1c was 5.3, better and glucose 110, kidneys okay ______

## 2013-04-22 NOTE — Patient Instructions (Signed)
Please check blood sugars at least half the time about 2 hours after any meal and as directed on waking up. Please bring blood sugar monitor to each visit  No change in meds

## 2013-04-22 NOTE — Progress Notes (Signed)
Patient ID: Scott Reynolds, male   DOB: 1940/12/01, 72 y.o.   MRN: [id number]  Reason for Appointment: Diabetes follow-up   History of Present Illness   He has had diabetes since 2001 which is usually well controlled.  Previously was taking glipizide and Actoplusmet but for the last year or so has been able to get control with metformin only.   Overall had been very compliant with his diet and he believes he has lost another 6-7 pounds since her last visit   Difficulties with medication adherence: financial limitations and inconsistent followup previously.  Monitors blood glucose: None recently, he did not pursue getting a blood sugar monitor as discussed previously  Glucometer: ?, Previously using Accu-Chek.  Blood Glucose readings: Does not check blood glucose levels.  Food preferences: low fat. Less carbs Physical activity: exercise: Yard work and cutting wood   Complications: bilateral leg amputation  Lab Results  Component Value Date   HGBA1C 5.5 12/17/2012   Lab Results  Component Value Date   MICROALBUR 0.5 12/17/2012   LDLCALC 65 12/17/2012   CREATININE 1.0 12/17/2012    HYPERLIPIDEMIA:         The lipid abnormality consists of elevated LDL; he has been compliant with simvastatin with good control   No visits with results within 1 Week(s) from this visit. Latest known visit with results is:  Office Visit on 12/17/2012  Component Date Value Range Status  . Microalb, Ur 12/17/2012 0.5  0.0 - 1.9 mg/dL Final  . Creatinine,U 40/98/1191 72.4   Final  . Microalb Creat Ratio 12/17/2012 0.7  0.0 - 30.0 mg/g Final  . Hemoglobin A1C 12/17/2012 5.5  4.6 - 6.5 % Final   Glycemic Control Guidelines for People with Diabetes:Non Diabetic:  <6%Goal of Therapy: <7%Additional Action Suggested:  >8%   . Sodium 12/17/2012 137  135 - 145 mEq/L Final  . Potassium 12/17/2012 4.1  3.5 - 5.1 mEq/L Final  . Chloride 12/17/2012 102  96 - 112 mEq/L Final  . CO2 12/17/2012 27  19 - 32 mEq/L Final   . Glucose, Bld 12/17/2012 104* 70 - 99 mg/dL Final  . BUN 47/82/9562 18  6 - 23 mg/dL Final  . Creatinine, Ser 12/17/2012 1.0  0.4 - 1.5 mg/dL Final  . Total Bilirubin 12/17/2012 1.0  0.3 - 1.2 mg/dL Final  . Alkaline Phosphatase 12/17/2012 63  39 - 117 U/L Final  . AST 12/17/2012 15  0 - 37 U/L Final  . ALT 12/17/2012 11  0 - 53 U/L Final  . Total Protein 12/17/2012 7.7  6.0 - 8.3 g/dL Final  . Albumin 13/12/6576 4.3  3.5 - 5.2 g/dL Final  . Calcium 46/96/2952 9.6  8.4 - 10.5 mg/dL Final  . GFR 84/13/2440 91.06  >60.00 mL/min Final  . Cholesterol 12/17/2012 118  0 - 200 mg/dL Final   ATP III Classification       Desirable:  < 200 mg/dL               Borderline High:  200 - 239 mg/dL          High:  > = 102 mg/dL  . Triglycerides 12/17/2012 51.0  0.0 - 149.0 mg/dL Final   Normal:  <725 mg/dLBorderline High:  150 - 199 mg/dL  . HDL 12/17/2012 43.30  >39.00 mg/dL Final  . VLDL 36/64/4034 10.2  0.0 - 40.0 mg/dL Final  . LDL Cholesterol 12/17/2012 65  0 - 99 mg/dL Final  . Total CHOL/HDL  Ratio 12/17/2012 3   Final                  Men          Women1/2 Average Risk     3.4          3.3Average Risk          5.0          4.42X Average Risk          9.6          7.13X Average Risk          15.0          11.0                      . WBC 12/17/2012 5.5  4.5 - 10.5 K/uL Final  . RBC 12/17/2012 4.55  4.22 - 5.81 Mil/uL Final  . Hemoglobin 12/17/2012 13.7  13.0 - 17.0 g/dL Final  . HCT 16/02/9603 41.0  39.0 - 52.0 % Final  . MCV 12/17/2012 90.2  78.0 - 100.0 fl Final  . MCHC 12/17/2012 33.5  30.0 - 36.0 g/dL Final  . RDW 54/01/8118 13.0  11.5 - 14.6 % Final  . Platelets 12/17/2012 162.0  150.0 - 400.0 K/uL Final  . Neutrophils Relative % 12/17/2012 71.6  43.0 - 77.0 % Final  . Lymphocytes Relative 12/17/2012 19.1  12.0 - 46.0 % Final  . Monocytes Relative 12/17/2012 7.0  3.0 - 12.0 % Final  . Eosinophils Relative 12/17/2012 1.9  0.0 - 5.0 % Final  . Basophils Relative 12/17/2012 0.4  0.0 - 3.0 %  Final  . Neutro Abs 12/17/2012 3.9  1.4 - 7.7 K/uL Final  . Lymphs Abs 12/17/2012 1.0  0.7 - 4.0 K/uL Final  . Monocytes Absolute 12/17/2012 0.4  0.1 - 1.0 K/uL Final  . Eosinophils Absolute 12/17/2012 0.1  0.0 - 0.7 K/uL Final  . Basophils Absolute 12/17/2012 0.0  0.0 - 0.1 K/uL Final      Medication List       This list is accurate as of: 04/22/13 10:49 AM.  Always use your most recent med list.               bimatoprost 0.01 % Soln  Commonly known as:  LUMIGAN  1 drop at bedtime.     COMBIGAN 0.2-0.5 % ophthalmic solution  Generic drug:  brimonidine-timolol     dorzolamide 2 % ophthalmic solution  Commonly known as:  TRUSOPT     losartan 50 MG tablet  Commonly known as:  COZAAR  Take 1 tablet (50 mg total) by mouth daily.     metFORMIN 1000 MG tablet  Commonly known as:  GLUCOPHAGE  Take 1 tablet (1,000 mg total) by mouth 2 (two) times daily. 1 tablet twice daily     simvastatin 20 MG tablet  Commonly known as:  ZOCOR  Take 1 tablet (20 mg total) by mouth at bedtime.        Allergies: No Known Allergies  Past Medical History  Diagnosis Date  . Anemia   . Erectile dysfunction     Past Surgical History  Procedure Laterality Date  . Leg amputation above knee Bilateral     2001 left leg and 2003 right leg    Family History  Problem Relation Age of Onset  . Diabetes Brother     Social History:  reports that he has never smoked. He does not  have any smokeless tobacco history on file. His alcohol and drug histories are not on file.  Review of Systems    Hypertension:    BP is Now controlled with losartan only. Not monitoring at home. No history of shortness of breath Eye exam in 7/14   Examination:   BP 124/70  Pulse 67  Temp(Src) 98.5 F (36.9 C)  Resp 12  Ht 5\' 8"  (1.727 m)  Wt 219 lb 11.2 oz (99.655 kg)  BMI 33.41 kg/m2  SpO2 97%  Body mass index is 33.41 kg/(m^2).   Assesment:   Diabetes type 2  The patient's diabetes control  needs to be assessed with A1c since he has not checked his blood sugars at home  He is only on metformin and tolerating this well at 2000 mg a day Overall control has been much better with very healthy diet, he is continuing to lose weight Has been fairly active despite his amputations He will start using a One Touch ultra monitor which was given today  HYPERTENSION: Blood pressure is excellent with only low-dose losartan, will continue and also recheck her renal function  LIPIDS controlled with simvastatin    Emmah Bratcher 04/22/2013, 10:49 AM

## 2013-04-23 ENCOUNTER — Encounter: Payer: Self-pay | Admitting: *Deleted

## 2013-07-18 ENCOUNTER — Other Ambulatory Visit: Payer: Self-pay | Admitting: *Deleted

## 2013-07-18 MED ORDER — METFORMIN HCL 1000 MG PO TABS: 1000.0000 mg | ORAL_TABLET | Freq: Two times a day (BID) | ORAL | Status: DC

## 2013-07-18 MED ORDER — SIMVASTATIN 20 MG PO TABS: 20.0000 mg | ORAL_TABLET | Freq: Every day | ORAL | Status: DC

## 2013-07-18 MED ORDER — LOSARTAN POTASSIUM 50 MG PO TABS: 50.0000 mg | ORAL_TABLET | Freq: Every day | ORAL | Status: DC

## 2013-08-25 ENCOUNTER — Ambulatory Visit (INDEPENDENT_AMBULATORY_CARE_PROVIDER_SITE_OTHER): Payer: MEDICARE | Admitting: Endocrinology

## 2013-08-25 ENCOUNTER — Encounter: Payer: Self-pay | Admitting: Endocrinology

## 2013-08-25 VITALS — BP 138/78 | HR 60 | Temp 97.9°F | Resp 16 | Ht 70.0 in | Wt 209.0 lb

## 2013-08-25 DIAGNOSIS — E7800 Pure hypercholesterolemia, unspecified: Secondary | ICD-10-CM

## 2013-08-25 DIAGNOSIS — E119 Type 2 diabetes mellitus without complications: Secondary | ICD-10-CM

## 2013-08-25 LAB — MICROALBUMIN / CREATININE URINE RATIO
Creatinine,U: 146.7 mg/dL
MICROALB/CREAT RATIO: 1.2 mg/g (ref 0.0–30.0)
Microalb, Ur: 1.8 mg/dL (ref 0.0–1.9)

## 2013-08-25 LAB — GLUCOSE, POCT (MANUAL RESULT ENTRY): POC GLUCOSE: 123 mg/dL — AB (ref 70–99)

## 2013-08-25 NOTE — Progress Notes (Signed)
Patient ID: Scott Reynolds, male   DOB: 1941-02-14, 73 y.o.   MRN: [id number]   Reason for Appointment: Diabetes follow-up   History of Present Illness   He has had diabetes since 2001 which is usually well controlled.  Previously was taking glipizide and Actoplusmet but since about 2013  has been able to get control with metformin only.  Overall had been very compliant with his diet and he has lost more weight  Glucose slightly higher in the lab today with eating ice cream last night  Difficulties with medication adherence: financial limitations and inconsistent followup previously.  Monitors blood glucose: None recently, he states that he lost the meter given on the last visit until recently and does not have any strips  Glucometer: ?, Previously using Accu-Chek.  Blood Glucose readings: Does not check blood glucose levels.  Food preferences: low fat. Small amounts of carbs Physical activity: exercise: Yard work and cutting wood   Complications: bilateral leg amputation  Wt Readings from Last 3 Encounters:  08/25/13 209 lb (94.802 kg)  04/22/13 219 lb 11.2 oz (99.655 kg)   Lab Results  Component Value Date   HGBA1C 5.3 04/22/2013   HGBA1C 5.5 12/17/2012   Lab Results  Component Value Date   MICROALBUR 0.5 12/17/2012   LDLCALC 65 12/17/2012   CREATININE 1.0 04/22/2013   Eye exam: 12/14 HYPERLIPIDEMIA:         The lipid abnormality consists of elevated LDL; he has been compliant with simvastatin with good control   Office Visit on 08/25/2013  Component Date Value Ref Range Status  . POC Glucose 08/25/2013 123* 70 - 99 mg/dl Final      Medication List       This list is accurate as of: 08/25/13 10:37 AM.  Always use your most recent med list.               aspirin 81 MG tablet  Take 81 mg by mouth daily.     bimatoprost 0.01 % Soln  Commonly known as:  LUMIGAN  1 drop at bedtime.     COMBIGAN 0.2-0.5 % ophthalmic solution  Generic drug:  brimonidine-timolol     dorzolamide 2 % ophthalmic solution  Commonly known as:  TRUSOPT     losartan 50 MG tablet  Commonly known as:  COZAAR  Take 1 tablet (50 mg total) by mouth daily.     metFORMIN 1000 MG tablet  Commonly known as:  GLUCOPHAGE  Take 1 tablet (1,000 mg total) by mouth 2 (two) times daily. 1 tablet twice daily     simvastatin 20 MG tablet  Commonly known as:  ZOCOR  Take 1 tablet (20 mg total) by mouth at bedtime.        Allergies: No Known Allergies  Past Medical History  Diagnosis Date  . Anemia   . Erectile dysfunction     Past Surgical History  Procedure Laterality Date  . Leg amputation above knee Bilateral     2001 left leg and 2003 right leg    Family History  Problem Relation Age of Onset  . Diabetes Brother     Social History:  reports that he has never smoked. He does not have any smokeless tobacco history on file. His alcohol and drug histories are not on file.  Review of Systems    Hypertension:   His blood pressure has been controlled with losartan only. Not monitoring at home.  No history of shortness of breath  He  has not had any screening for colon cancer  He has been compliant with his simvastatin for hyperlipidemia and cardiovascular protection  Lab Results  Component Value Date   CHOL 118 12/17/2012   HDL 43.30 12/17/2012   LDLCALC 65 12/17/2012   TRIG 51.0 12/17/2012   CHOLHDL 3 12/17/2012       Examination:   BP 138/78  Pulse 60  Temp(Src) 97.9 F (36.6 C)  Resp 16  Ht 5\' 10"  (1.778 m)  Wt 209 lb (94.802 kg)  BMI 29.99 kg/m2  SpO2 96%  Body mass index is 29.99 kg/(m^2).   Assesment:   Diabetes type 2  The patient's diabetes control needs to be assessed with A1c since again he has not checked his blood sugars at home  He is only on metformin and tolerating this well at 2000 mg a day Overall control has been much better with very healthy diet, he is continuing to lose weight  Has been trying to be active usually He will start  using the One Touch ultra monitor, prescription for strips has been sent  HYPERTENSION: Blood pressure is controlled with only low-dose losartan, will continue and also recheck renal function and microalbumin  LIPIDS controlled with simvastatin    Scott Reynolds 08/25/2013, 10:37 AM

## 2013-08-27 ENCOUNTER — Other Ambulatory Visit: Payer: Self-pay | Admitting: *Deleted

## 2013-08-27 DIAGNOSIS — E119 Type 2 diabetes mellitus without complications: Secondary | ICD-10-CM

## 2013-09-01 ENCOUNTER — Other Ambulatory Visit: Payer: Self-pay | Admitting: *Deleted

## 2013-09-01 ENCOUNTER — Telehealth: Payer: Self-pay | Admitting: *Deleted

## 2013-09-01 MED ORDER — GLUCOSE BLOOD VI STRP: ORAL_STRIP | Status: DC

## 2013-09-09 ENCOUNTER — Other Ambulatory Visit (INDEPENDENT_AMBULATORY_CARE_PROVIDER_SITE_OTHER): Payer: MEDICARE

## 2013-09-09 DIAGNOSIS — E119 Type 2 diabetes mellitus without complications: Secondary | ICD-10-CM

## 2013-09-09 LAB — BASIC METABOLIC PANEL
BUN: 24 mg/dL — ABNORMAL HIGH (ref 6–23)
CALCIUM: 9.6 mg/dL (ref 8.4–10.5)
CO2: 28 mEq/L (ref 19–32)
Chloride: 101 mEq/L (ref 96–112)
Creatinine, Ser: 1.5 mg/dL (ref 0.4–1.5)
GFR: 59.81 mL/min — ABNORMAL LOW (ref >60.00)
GLUCOSE: 92 mg/dL (ref 70–99)
Potassium: 5 mEq/L (ref 3.5–5.1)
Sodium: 140 mEq/L (ref 135–145)

## 2013-09-09 LAB — HEMOGLOBIN A1C: Hgb A1c MFr Bld: 5 % (ref 4.6–6.5)

## 2013-09-09 LAB — LIPID PANEL
CHOL/HDL RATIO: 2
Cholesterol: 131 mg/dL (ref 0–200)
HDL: 54.4 mg/dL (ref >39.00)
LDL Cholesterol: 60 mg/dL (ref 0–99)
TRIGLYCERIDES: 83 mg/dL (ref 0.0–149.0)
VLDL: 16.6 mg/dL (ref 0.0–40.0)

## 2013-09-10 ENCOUNTER — Other Ambulatory Visit: Payer: Self-pay | Admitting: *Deleted

## 2013-09-10 ENCOUNTER — Other Ambulatory Visit (INDEPENDENT_AMBULATORY_CARE_PROVIDER_SITE_OTHER): Payer: MEDICARE

## 2013-09-10 DIAGNOSIS — Z1211 Encounter for screening for malignant neoplasm of colon: Secondary | ICD-10-CM

## 2013-09-10 LAB — FECAL OCCULT BLOOD, IMMUNOCHEMICAL: FECAL OCCULT BLD: NEGATIVE

## 2013-09-13 NOTE — Progress Notes (Signed)
Quick Note:  Please let patient know that the lab result is normal and no further action needed ______ 

## 2013-12-15 ENCOUNTER — Other Ambulatory Visit: Payer: Self-pay | Admitting: *Deleted

## 2013-12-15 MED ORDER — METFORMIN HCL 1000 MG PO TABS: 1000.0000 mg | ORAL_TABLET | Freq: Two times a day (BID) | ORAL | Status: DC

## 2013-12-18 ENCOUNTER — Other Ambulatory Visit: Payer: Self-pay | Admitting: *Deleted

## 2013-12-18 MED ORDER — LOSARTAN POTASSIUM 50 MG PO TABS: 50.0000 mg | ORAL_TABLET | Freq: Every day | ORAL | Status: DC

## 2014-01-15 ENCOUNTER — Telehealth: Payer: Self-pay | Admitting: Endocrinology

## 2014-01-15 ENCOUNTER — Telehealth: Payer: Self-pay | Admitting: *Deleted

## 2014-01-15 NOTE — Telephone Encounter (Signed)
Patient needs a script for prosthetic supplies.  It just needs to say please dispense needed supplies for prosthetics.  Please advise

## 2014-01-15 NOTE — Telephone Encounter (Signed)
Patient need a prescription for prosthetic supplies. Scott Reynolds # [phone]  Fax # [phone]

## 2014-01-15 NOTE — Telephone Encounter (Signed)
Prescription will be done, he needs to have followup appointment. Also would recommend that he get a PCP

## 2014-01-28 ENCOUNTER — Other Ambulatory Visit: Payer: Self-pay | Admitting: *Deleted

## 2014-01-28 MED ORDER — SIMVASTATIN 20 MG PO TABS: 20.0000 mg | ORAL_TABLET | Freq: Every day | ORAL | Status: DC

## 2014-03-04 ENCOUNTER — Encounter: Payer: Self-pay | Admitting: Endocrinology

## 2014-03-04 ENCOUNTER — Ambulatory Visit (INDEPENDENT_AMBULATORY_CARE_PROVIDER_SITE_OTHER): Payer: MEDICARE | Admitting: Endocrinology

## 2014-03-04 VITALS — BP 126/70 | HR 76 | Temp 98.0°F | Resp 14 | Ht 70.0 in | Wt 202.0 lb

## 2014-03-04 DIAGNOSIS — E1159 Type 2 diabetes mellitus with other circulatory complications: Secondary | ICD-10-CM

## 2014-03-04 DIAGNOSIS — Z89619 Acquired absence of unspecified leg above knee: Secondary | ICD-10-CM

## 2014-03-04 DIAGNOSIS — N182 Chronic kidney disease, stage 2 (mild): Secondary | ICD-10-CM

## 2014-03-04 DIAGNOSIS — Z23 Encounter for immunization: Secondary | ICD-10-CM

## 2014-03-04 LAB — MICROALBUMIN / CREATININE URINE RATIO
CREATININE, U: 183.5 mg/dL
MICROALB UR: 1.7 mg/dL (ref 0.0–1.9)
MICROALB/CREAT RATIO: 0.9 mg/g (ref 0.0–30.0)

## 2014-03-04 LAB — COMPREHENSIVE METABOLIC PANEL
ALT: 11 U/L (ref 0–53)
AST: 17 U/L (ref 0–37)
Albumin: 4.1 g/dL (ref 3.5–5.2)
Alkaline Phosphatase: 61 U/L (ref 39–117)
BILIRUBIN TOTAL: 1.3 mg/dL — AB (ref 0.2–1.2)
BUN: 20 mg/dL (ref 6–23)
CO2: 25 meq/L (ref 19–32)
CREATININE: 1.1 mg/dL (ref 0.4–1.5)
Calcium: 9.7 mg/dL (ref 8.4–10.5)
Chloride: 101 mEq/L (ref 96–112)
GFR: 84.13 mL/min (ref >60.00)
GLUCOSE: 92 mg/dL (ref 70–99)
Potassium: 4.2 mEq/L (ref 3.5–5.1)
Sodium: 136 mEq/L (ref 135–145)
Total Protein: 8.7 g/dL — ABNORMAL HIGH (ref 6.0–8.3)

## 2014-03-04 LAB — HEMOGLOBIN A1C: Hgb A1c MFr Bld: 5 % (ref 4.6–6.5)

## 2014-03-04 NOTE — Patient Instructions (Signed)
Please check blood sugars at least half the time about 2 hours after any meal and as directed on waking up.   Please bring blood sugar monitor to each visit  

## 2014-03-04 NOTE — Progress Notes (Signed)
Quick Note:  Please let patient know that the lab result is normal and no further action needed ______ 

## 2014-03-04 NOTE — Progress Notes (Signed)
Patient ID: Scott Reynolds, male   DOB: 02-26-1941, 73 y.o.   MRN: [id number]   Reason for Appointment: Diabetes follow-up   History of Present Illness   He has had diabetes since 2001 which is usually well controlled.  Previously was taking glipizide and Actoplusmet but since about 2013  has been able to get control with metformin only.  More recently has been very compliant with his diet with making healthy choices and reduced carbohydrates Since his last visit 6 months ago he has lost more weight  Glucose control difficult assess as he is not monitoring his readings and no labs are available  Difficulties with medication adherence: financial limitations and inconsistent followup .  Monitors blood glucose: None recently   Glucometer: ?, Previously using Accu-Chek.  Blood Glucose readings: Does not check blood glucose levels.  Food preferences: low fat. Small amounts of carbs Physical activity: exercise: Yard work    Complications: bilateral leg amputation  Wt Readings from Last 3 Encounters:  03/04/14 202 lb (91.627 kg)  08/25/13 209 lb (94.802 kg)  04/22/13 219 lb 11.2 oz (99.655 kg)   Lab Results  Component Value Date   HGBA1C 5.0 09/09/2013   HGBA1C 5.3 04/22/2013   HGBA1C 5.5 12/17/2012   Lab Results  Component Value Date   MICROALBUR 1.8 08/25/2013   LDLCALC 60 09/09/2013   CREATININE 1.5 09/09/2013   Eye exam: 9/15    HYPERLIPIDEMIA:         The lipid abnormality consists of elevated LDL; he has been compliant with simvastatin with good control  Lab Results  Component Value Date   CHOL 131 09/09/2013   HDL 54.40 09/09/2013   LDLCALC 60 09/09/2013   TRIG 83.0 09/09/2013   CHOLHDL 2 09/09/2013        Medication List       This list is accurate as of: 03/04/14 11:43 AM.  Always use your most recent med list.               aspirin 81 MG tablet  Take 81 mg by mouth daily.     bimatoprost 0.01 % Soln  Commonly known as:  LUMIGAN  1 drop at bedtime.      COMBIGAN 0.2-0.5 % ophthalmic solution  Generic drug:  brimonidine-timolol     dorzolamide 2 % ophthalmic solution  Commonly known as:  TRUSOPT     glucose blood test strip  Commonly known as:  ONE TOUCH ULTRA TEST  Use as instructed to check blood sugar 3 times per week dx code 250.00     losartan 50 MG tablet  Commonly known as:  COZAAR  Take 1 tablet (50 mg total) by mouth daily.     metFORMIN 1000 MG tablet  Commonly known as:  GLUCOPHAGE  Take 1 tablet (1,000 mg total) by mouth 2 (two) times daily. 1 tablet twice daily     prednisoLONE acetate 1 % ophthalmic suspension  Commonly known as:  PRED FORTE     simvastatin 20 MG tablet  Commonly known as:  ZOCOR  Take 1 tablet (20 mg total) by mouth at bedtime.        Allergies: No Known Allergies  Past Medical History  Diagnosis Date  . Anemia   . Erectile dysfunction     Past Surgical History  Procedure Laterality Date  . Leg amputation above knee Bilateral     2001 left leg and 2003 right leg    Family History  Problem Relation Age  of Onset  . Diabetes Brother     Social History:  reports that he has never smoked. He does not have any smokeless tobacco history on file. His alcohol and drug histories are not on file.  Review of Systems    Hypertension:   His blood pressure has been controlled with losartan only. Not monitoring at home.  He has  had Hemoccult screening for colon cancer in 2015  He has been compliant with his simvastatin for hyperlipidemia and cardiovascular protection  Lab Results  Component Value Date   CHOL 131 09/09/2013   HDL 54.40 09/09/2013   LDLCALC 60 09/09/2013   TRIG 83.0 09/09/2013   CHOLHDL 2 09/09/2013       Examination:   BP 126/70  Pulse 76  Temp(Src) 98 F (36.7 C)  Resp 14  Ht 5\' 10"  (1.778 m)  Wt 202 lb (91.627 kg)  BMI 28.98 kg/m2  SpO2 98%  Body mass index is 28.98 kg/(m^2).   Heart sounds normal No carotid bruit heard  Assesment:   Diabetes type  2  He has lost more weight and is doing very well with his diet  The patient's diabetes control needs to be assessed with A1c since again he has not checked his blood sugars at home despite giving him a new meter on the last visit  He is only on metformin and tolerating this well at 2000 mg a day  Has been trying to be active as much as possible He will start using the One Touch ultra monitor and check some readings after meals also  HYPERTENSION: Blood pressure is controlled with only low-dose losartan, will continue and also recheck renal function and microalbumin  LIPIDS controlled with simvastatin, to check on next visit again    [facility] LLCKUMAR,Derrick Tiegs 03/04/2014, 11:43 AM

## 2014-03-05 ENCOUNTER — Ambulatory Visit: Payer: MEDICARE | Admitting: Endocrinology

## 2014-03-06 ENCOUNTER — Other Ambulatory Visit: Payer: Self-pay | Admitting: *Deleted

## 2014-03-06 MED ORDER — GLUCOSE BLOOD VI STRP: ORAL_STRIP | Status: DC

## 2014-03-12 NOTE — Telephone Encounter (Signed)
error 

## 2014-04-22 ENCOUNTER — Other Ambulatory Visit: Payer: Self-pay | Admitting: Endocrinology

## 2014-06-15 ENCOUNTER — Other Ambulatory Visit: Payer: Self-pay | Admitting: Endocrinology

## 2014-07-06 ENCOUNTER — Telehealth: Payer: Self-pay | Admitting: Endocrinology

## 2014-07-06 ENCOUNTER — Ambulatory Visit: Payer: MEDICARE | Admitting: Endocrinology

## 2014-07-06 NOTE — Telephone Encounter (Signed)
Patient no showed today's appt. Please advise on how to follow up. °A. No follow up necessary. °B. Follow up urgent. Contact patient immediately. °C. Follow up necessary. Contact patient and schedule visit in ___ days. °D. Follow up advised. Contact patient and schedule visit in ____weeks. ° °

## 2014-07-12 NOTE — Telephone Encounter (Signed)
Follow up necessary. Contact patient and schedule visit asap 

## 2014-07-28 ENCOUNTER — Encounter: Payer: Self-pay | Admitting: Endocrinology

## 2014-07-28 NOTE — Telephone Encounter (Signed)
Letter mailed - pt does not allow incoming calls on his phone

## 2014-08-21 ENCOUNTER — Ambulatory Visit: Payer: Self-pay | Admitting: Endocrinology

## 2014-08-25 ENCOUNTER — Other Ambulatory Visit: Payer: Self-pay | Admitting: Endocrinology

## 2014-09-15 ENCOUNTER — Other Ambulatory Visit: Payer: Self-pay | Admitting: Endocrinology

## 2014-09-15 LAB — HM DIABETES EYE EXAM

## 2014-09-16 ENCOUNTER — Encounter: Payer: Self-pay | Admitting: *Deleted

## 2014-09-23 ENCOUNTER — Ambulatory Visit (INDEPENDENT_AMBULATORY_CARE_PROVIDER_SITE_OTHER): Payer: Medicare Other | Admitting: Endocrinology

## 2014-09-23 ENCOUNTER — Other Ambulatory Visit: Payer: Self-pay | Admitting: *Deleted

## 2014-09-23 ENCOUNTER — Encounter: Payer: Self-pay | Admitting: Endocrinology

## 2014-09-23 VITALS — BP 126/82 | HR 68 | Temp 98.0°F | Resp 14 | Ht 70.0 in | Wt 195.0 lb

## 2014-09-23 DIAGNOSIS — E7800 Pure hypercholesterolemia, unspecified: Secondary | ICD-10-CM

## 2014-09-23 DIAGNOSIS — Z23 Encounter for immunization: Secondary | ICD-10-CM

## 2014-09-23 DIAGNOSIS — E119 Type 2 diabetes mellitus without complications: Secondary | ICD-10-CM

## 2014-09-23 DIAGNOSIS — E1159 Type 2 diabetes mellitus with other circulatory complications: Secondary | ICD-10-CM

## 2014-09-23 DIAGNOSIS — I10 Essential (primary) hypertension: Secondary | ICD-10-CM

## 2014-09-23 DIAGNOSIS — Z89619 Acquired absence of unspecified leg above knee: Secondary | ICD-10-CM

## 2014-09-23 DIAGNOSIS — N182 Chronic kidney disease, stage 2 (mild): Secondary | ICD-10-CM | POA: Diagnosis not present

## 2014-09-23 LAB — CBC WITH DIFFERENTIAL/PLATELET
Basophils Absolute: 0 10*3/uL (ref 0.0–0.1)
Basophils Relative: 0.5 % (ref 0.0–3.0)
EOS ABS: 0 10*3/uL (ref 0.0–0.7)
Eosinophils Relative: 1 % (ref 0.0–5.0)
HEMATOCRIT: 39.1 % (ref 39.0–52.0)
Hemoglobin: 13.3 g/dL (ref 13.0–17.0)
Lymphocytes Relative: 25.8 % (ref 12.0–46.0)
Lymphs Abs: 1.3 10*3/uL (ref 0.7–4.0)
MCHC: 33.9 g/dL (ref 30.0–36.0)
MCV: 89.3 fl (ref 78.0–100.0)
MONO ABS: 0.3 10*3/uL (ref 0.1–1.0)
Monocytes Relative: 5.9 % (ref 3.0–12.0)
NEUTROS PCT: 66.8 % (ref 43.0–77.0)
Neutro Abs: 3.4 10*3/uL (ref 1.4–7.7)
PLATELETS: 167 10*3/uL (ref 150.0–400.0)
RBC: 4.39 Mil/uL (ref 4.22–5.81)
RDW: 13.2 % (ref 11.5–15.5)
WBC: 5.1 10*3/uL (ref 4.0–10.5)

## 2014-09-23 LAB — BASIC METABOLIC PANEL
BUN: 21 mg/dL (ref 6–23)
CHLORIDE: 101 meq/L (ref 96–112)
CO2: 30 mEq/L (ref 19–32)
CREATININE: 1.08 mg/dL (ref 0.40–1.50)
Calcium: 9.7 mg/dL (ref 8.4–10.5)
GFR: 85.8 mL/min (ref >60.00)
Glucose, Bld: 108 mg/dL — ABNORMAL HIGH (ref 70–99)
POTASSIUM: 4 meq/L (ref 3.5–5.1)
Sodium: 136 mEq/L (ref 135–145)

## 2014-09-23 LAB — LIPID PANEL
CHOLESTEROL: 153 mg/dL (ref 0–200)
HDL: 44.9 mg/dL (ref >39.00)
LDL Cholesterol: 98 mg/dL (ref 0–99)
NonHDL: 108.1
TRIGLYCERIDES: 52 mg/dL (ref 0.0–149.0)
Total CHOL/HDL Ratio: 3
VLDL: 10.4 mg/dL (ref 0.0–40.0)

## 2014-09-23 LAB — HEMOGLOBIN A1C: Hgb A1c MFr Bld: 5 % (ref 4.6–6.5)

## 2014-09-23 LAB — POCT GLUCOSE (DEVICE FOR HOME USE): Glucose Fasting, POC: 86 mg/dL (ref 70–99)

## 2014-09-23 MED ORDER — LOSARTAN POTASSIUM 50 MG PO TABS: ORAL_TABLET | ORAL | Status: DC

## 2014-09-23 MED ORDER — SIMVASTATIN 20 MG PO TABS: ORAL_TABLET | ORAL | Status: DC

## 2014-09-23 MED ORDER — METFORMIN HCL 1000 MG PO TABS: ORAL_TABLET | ORAL | Status: DC

## 2014-09-23 NOTE — Progress Notes (Signed)
Patient ID: Scott Reynolds, male   DOB: Jan 23, 1941, 74 y.o.   MRN: [id number]   Reason for Appointment: Diabetes follow-up   History of Present Illness   He has had diabetes since 2001 which is usually well controlled.  Previously was taking glipizide and Actoplusmet but since about 2013  has been able to get control with metformin only.   He again has been very compliant with his diet with making healthy choices and reduced carbohydrates Since his last visit 6 months ago he has lost more weight, previously had also lost 7 pounds  Glucose control difficult assess as he is still not monitoring his readings even though he was given a glucose monitor on his last visit  Have no labs available today but his A1c previously has been consistently in the normal range  Monitors blood glucose: None recently   Glucometer:  One Touch  Blood Glucose readings: Does not check blood glucose levels.   Food preferences: low fat. Small amounts of carbs Physical activity: exercise: Yard work like splitting wood   Complications: bilateral leg amputation  Wt Readings from Last 3 Encounters:  09/23/14 195 lb (88.451 kg)  03/04/14 202 lb (91.627 kg)  08/25/13 209 lb (94.802 kg)   Lab Results  Component Value Date   HGBA1C 5.0 03/04/2014   HGBA1C 5.0 09/09/2013   HGBA1C 5.3 04/22/2013   Lab Results  Component Value Date   MICROALBUR 1.7 03/04/2014   LDLCALC 60 09/09/2013   CREATININE 1.1 03/04/2014   Eye exam: 9/15    HYPERLIPIDEMIA:         The lipid abnormality consists of elevated LDL; he has been compliant with simvastatin with good control  Lab Results  Component Value Date   CHOL 131 09/09/2013   HDL 54.40 09/09/2013   LDLCALC 60 09/09/2013   TRIG 83.0 09/09/2013   CHOLHDL 2 09/09/2013        Medication List       This list is accurate as of: 09/23/14  9:35 AM.  Always use your most recent med list.               aspirin 81 MG tablet  Take 81 mg by mouth daily.     bimatoprost 0.01 % Soln  Commonly known as:  LUMIGAN  1 drop at bedtime.     brimonidine 0.15 % ophthalmic solution  Commonly known as:  ALPHAGAN     COMBIGAN 0.2-0.5 % ophthalmic solution  Generic drug:  brimonidine-timolol     dorzolamide 2 % ophthalmic solution  Commonly known as:  TRUSOPT     glucose blood test strip  Commonly known as:  ONE TOUCH ULTRA TEST  Use as instructed to check blood sugar 3 times per week dx code E11.9     latanoprost 0.005 % ophthalmic solution  Commonly known as:  XALATAN     losartan 50 MG tablet  Commonly known as:  COZAAR  TAKE 1 BY MOUTH DAILY     metFORMIN 1000 MG tablet  Commonly known as:  GLUCOPHAGE  TAKE 1 BY MOUTH TWICE DAILY     prednisoLONE acetate 1 % ophthalmic suspension  Commonly known as:  PRED FORTE     simvastatin 20 MG tablet  Commonly known as:  ZOCOR  TAKE 1 BY MOUTH AT BEDTIME     timolol 0.5 % ophthalmic solution  Commonly known as:  TIMOPTIC        Allergies: No Known Allergies  Past Medical History  Diagnosis Date  . Anemia   . Erectile dysfunction     Past Surgical History  Procedure Laterality Date  . Leg amputation above knee Bilateral     2001 left leg and 2003 right leg    Family History  Problem Relation Age of Onset  . Diabetes Brother     Social History:  reports that he has never smoked. He does not have any smokeless tobacco history on file. His alcohol and drug histories are not on file.  Review of Systems    Hypertension:   His blood pressure has been controlled with losartan only. Not monitoring at home.  He has  had Hemoccult screening for colon cancer in 2015  He has been compliant with his simvastatin for hyperlipidemia and cardiovascular protection  Lab Results  Component Value Date   CHOL 131 09/09/2013   HDL 54.40 09/09/2013   LDLCALC 60 09/09/2013   TRIG 83.0 09/09/2013   CHOLHDL 2 09/09/2013       Examination:   BP 126/82 mmHg  Pulse 68  Temp(Src) 98 F  (36.7 C)  Resp 14  Ht 5\' 10"  (1.778 m)  Wt 195 lb (88.451 kg)  BMI 27.98 kg/m2  SpO2 95%  Body mass index is 27.98 kg/(m^2).   Exam not indicated    Assesment:   Diabetes type 2  He has lost more weight and is doing very well with his diet and trying to stay active  Again his diabetes control needs to be assessed with A1c.  This has been very stable in the past and around 5% He has not checked his blood sugars at home despite giving him a new meter on each of his previous 2 visits He is only on metformin and tolerating this well at 2000 mg a day  He will start using the One Touch ultra monitor and check some readings periodically to make sure the sugar go up including after meals  HYPERTENSION: Blood pressure is controlled with only low-dose losartan, will continue  Need to also recheck renal function   LIPIDS controlled with simvastatin, to check levels  Preventive care: He is due for Prevnar and will do so today   Izzabella Besse 09/23/2014, 9:35 AM

## 2014-09-24 ENCOUNTER — Telehealth: Payer: Self-pay | Admitting: Endocrinology

## 2014-09-24 NOTE — Telephone Encounter (Signed)
Pt called and would like Rhonda to call him at home in concerns about his glucose  meter call @ [phone]

## 2014-09-25 ENCOUNTER — Other Ambulatory Visit: Payer: Self-pay | Admitting: *Deleted

## 2014-09-25 MED ORDER — GLUCOSE BLOOD VI STRP: ORAL_STRIP | Status: DC

## 2014-09-25 NOTE — Progress Notes (Signed)
Quick Note:  Please let patient know that the lab results are normal, A1c 5% and no further action needed ______

## 2014-09-25 NOTE — Telephone Encounter (Signed)
lmtcb

## 2014-10-20 ENCOUNTER — Other Ambulatory Visit: Payer: Self-pay | Admitting: Endocrinology

## 2015-02-22 ENCOUNTER — Other Ambulatory Visit: Payer: Self-pay | Admitting: Endocrinology

## 2015-03-26 ENCOUNTER — Ambulatory Visit (INDEPENDENT_AMBULATORY_CARE_PROVIDER_SITE_OTHER): Payer: Medicare Other | Admitting: Endocrinology

## 2015-03-26 ENCOUNTER — Encounter: Payer: Self-pay | Admitting: Endocrinology

## 2015-03-26 VITALS — BP 118/70 | HR 72 | Temp 98.7°F | Resp 14 | Ht 70.0 in | Wt 198.6 lb

## 2015-03-26 DIAGNOSIS — E7800 Pure hypercholesterolemia, unspecified: Secondary | ICD-10-CM

## 2015-03-26 DIAGNOSIS — Z0000 Encounter for general adult medical examination without abnormal findings: Secondary | ICD-10-CM | POA: Diagnosis not present

## 2015-03-26 DIAGNOSIS — E119 Type 2 diabetes mellitus without complications: Secondary | ICD-10-CM

## 2015-03-26 DIAGNOSIS — I10 Essential (primary) hypertension: Secondary | ICD-10-CM | POA: Diagnosis not present

## 2015-03-26 LAB — COMPREHENSIVE METABOLIC PANEL
ALBUMIN: 4.4 g/dL (ref 3.5–5.2)
ALT: 9 U/L (ref 0–53)
AST: 17 U/L (ref 0–37)
Alkaline Phosphatase: 56 U/L (ref 39–117)
BUN: 24 mg/dL — AB (ref 6–23)
CHLORIDE: 102 meq/L (ref 96–112)
CO2: 31 meq/L (ref 19–32)
Calcium: 10.2 mg/dL (ref 8.4–10.5)
Creatinine, Ser: 0.99 mg/dL (ref 0.40–1.50)
GFR: 94.73 mL/min (ref >60.00)
Glucose, Bld: 111 mg/dL — ABNORMAL HIGH (ref 70–99)
Potassium: 4.7 mEq/L (ref 3.5–5.1)
SODIUM: 139 meq/L (ref 135–145)
Total Bilirubin: 1.1 mg/dL (ref 0.2–1.2)
Total Protein: 7.7 g/dL (ref 6.0–8.3)

## 2015-03-26 LAB — LIPID PANEL
CHOLESTEROL: 145 mg/dL (ref 0–200)
HDL: 48.4 mg/dL (ref >39.00)
LDL Cholesterol: 89 mg/dL (ref 0–99)
NonHDL: 97.08
Total CHOL/HDL Ratio: 3
Triglycerides: 40 mg/dL (ref 0.0–149.0)
VLDL: 8 mg/dL (ref 0.0–40.0)

## 2015-03-26 LAB — POCT GLYCOSYLATED HEMOGLOBIN (HGB A1C): Hemoglobin A1C: 4.9

## 2015-03-26 NOTE — Progress Notes (Signed)
Patient ID: Scott Reynolds, male   DOB: [birth year], 74 y.o.   MRN: [id number]   Reason for Appointment: Complete physical exam and Diabetes follow-up   History of Present Illness   He has had diabetes since 2001 which is usually well controlled.  Previously was taking glipizide and Actoplusmet but since about 2013  has been able to get control with metformin only.   He again has been mostly compliant with his diet with making healthy choices and reduced carbohydrates Since his last visit 6 months ago his weight has leveled off, has gone back up 2 pounds however Glucose control difficult assess as he is still not monitoring his readings even though he was given a glucose monitor a couple of times and he feels that he is too busy taking care of his wife A1c is excellent again and probably lower than expected  Monitors blood glucose: None recently   Glucometer:  One Touch  Blood Glucose readings: Does not check blood glucose levels.   Food preferences: low fat. Small amounts of carbs Physical activity: exercise: Yard work periodically   Complications: bilateral leg amputation  Wt Readings from Last 3 Encounters:  03/26/15 198 lb 9.6 oz (90.084 kg)  09/23/14 195 lb (88.451 kg)  03/04/14 202 lb (91.627 kg)   Lab Results  Component Value Date   HGBA1C 4.9 03/26/2015   HGBA1C 5.0 09/23/2014   HGBA1C 5.0 03/04/2014   Lab Results  Component Value Date   MICROALBUR 1.7 03/04/2014   LDLCALC 89 03/26/2015   CREATININE 0.99 03/26/2015      HYPERLIPIDEMIA:         The lipid abnormality consists of elevated LDL; he has been compliant with simvastatin with good control  Lab Results  Component Value Date   CHOL 145 03/26/2015   HDL 48.40 03/26/2015   LDLCALC 89 03/26/2015   TRIG 40.0 03/26/2015   CHOLHDL 3 03/26/2015     PREVENTIVE CARE:  Diet: Low fat and usually small portions Exercise: Minimal currently, does some yard work History of falls: None   Annual  hemoccults:           08/2014   EYE exams:                             08/2014   Colonoscopy/sigmoidoscopy  none   PSA:  not indicated   Yearly flu vaccine:                      yes   Prevnar:   09/2014   Lipids:  LDL 98  Tetanus booster:  ?    Zostavax: ?   Pneumovax:  07/2004          Medication List       This list is accurate as of: 03/26/15 11:59 PM.  Always use your most recent med list.               aspirin 81 MG tablet  Take 81 mg by mouth daily.     bimatoprost 0.01 % Soln  Commonly known as:  LUMIGAN  1 drop at bedtime.     brimonidine 0.15 % ophthalmic solution  Commonly known as:  ALPHAGAN     COMBIGAN 0.2-0.5 % ophthalmic solution  Generic drug:  brimonidine-timolol     dorzolamide 2 % ophthalmic solution  Commonly known as:  TRUSOPT     glucose blood test strip  Commonly known as:  ONE TOUCH ULTRA TEST  Use as instructed to check blood sugar 3 times per week dx code E11.9     latanoprost 0.005 % ophthalmic solution  Commonly known as:  XALATAN     losartan 50 MG tablet  Commonly known as:  COZAAR  TAKE 1 BY MOUTH DAILY     metFORMIN 1000 MG tablet  Commonly known as:  GLUCOPHAGE  TAKE 1 BY MOUTH TWICE DAILY     prednisoLONE acetate 1 % ophthalmic suspension  Commonly known as:  PRED FORTE     simvastatin 20 MG tablet  Commonly known as:  ZOCOR  TAKE 1 BY MOUTH AT BEDTIME     timolol 0.5 % ophthalmic solution  Commonly known as:  TIMOPTIC        Allergies: No Known Allergies  Past Medical History  Diagnosis Date  . Erectile dysfunction   . Hypertension   . Glaucoma   . Diabetes mellitus without complication (HCC)   . Hyperlipidemia     Past Surgical History  Procedure Laterality Date  . Leg amputation above knee Bilateral     2001 left leg and 2003 right leg    Family History  Problem Relation Age of Onset  . Diabetes Brother     Social History:  reports that he has never smoked. He does not have any smokeless tobacco  history on file. His alcohol and drug histories are not on file.  Review of Systems   Review of Systems  Constitutional: Negative for malaise/fatigue.  Eyes: Negative for blurred vision.  Respiratory: Negative for shortness of breath.   Cardiovascular: Negative for chest pain and palpitations.  Gastrointestinal: Negative for abdominal pain.       No change in bowel habits  Genitourinary: Negative for frequency.       Nocturia 2x  Musculoskeletal: Negative for joint pain.  Skin: Negative for rash.  Neurological: Negative for tingling, tremors and headaches.  Endo/Heme/Allergies: Negative for polydipsia.  Psychiatric/Behavioral: The patient does not have insomnia.       Hypertension:   His blood pressure has been controlled with losartan only. Not monitoring at home.  He has been compliant with his simvastatin for hyperlipidemia and cardiovascular protection  Lab Results  Component Value Date   CHOL 145 03/26/2015   HDL 48.40 03/26/2015   LDLCALC 89 03/26/2015   TRIG 40.0 03/26/2015   CHOLHDL 3 03/26/2015       Examination:   BP 118/70 mmHg  Pulse 72  Temp(Src) 98.7 F (37.1 C)  Resp 14  Ht  (1.778 m)  Wt 198 lb 9.6 oz (90.084 kg)  BMI 28.50 kg/m2  SpO2 98%  Body mass index is 28.5 kg/(m^2).   Physical Exam  Constitutional: He appears well-developed and well-nourished.  HENT:  Mouth/Throat: Oropharynx is clear and moist.  Eyes:  Left fundus not visible due to lenticular opacity.  Right fundus exam appears normal  Neck: No JVD present. No thyromegaly present.  Cardiovascular: Normal rate, regular rhythm and normal heart sounds.  Exam reveals no gallop.   No murmur heard. Pulmonary/Chest: Breath sounds normal. He has no wheezes. He has no rales.  Abdominal: Soft. He exhibits no distension and no mass. There is no tenderness. No hernia.  Genitourinary: Rectum normal.  Prostate 2+ enlarged, firm, no distinct nodules felt  Musculoskeletal: He exhibits no  edema.  Lymphadenopathy:    He has no cervical adenopathy.  Neurological: He displays normal reflexes. No cranial nerve  deficit. He exhibits normal muscle tone.  Skin: Rash noted. No pallor.  Pigmented maculo papular rash on back, diffuse patches present, has tiny papules  Psychiatric: He has a normal mood and affect.  Vitals reviewed.     Assesment:   Diabetes type 2   Again his diabetes control appears excellent since he has been able to follow a healthy diet and maintain his weight Although he has a normal A1c his blood sugars are being maintained with continued metformin which will help his insulin resistance A1c has been very stable in the past and unchanged around 5% He has not checked his blood sugars at home despite giving him a new meter on each of his previous 2 visits and may not be necessary because of his very stable condition; he has refused to check his sugars Recommended that he start walking or other exercise program and he will do so when he is able to get better care for his wife for home he is the sole caregiver  HYPERTENSION: Blood pressure is controlled with  low-dose losartan, will continue same dose  Need to also recheck renal function   LIPIDS controlled with simvastatin, to check lipid levels today  He has benign prostatic hypertrophy with minimal symptoms  Glaucoma with loss of vision in the left eye: Continue follow-up with ophthalmologist  Preventive care: He is due for influenza vaccine which was given 81 mg aspirin to be continued Continue heart healthy diet Colorectal screening: Hemoccult given today PSA level not indicated due to his age Discussed advanced directives and he will go ahead and process these and give [place] a copy when available    [facility] 03/27/2015, 2:39 PM

## 2015-03-27 ENCOUNTER — Encounter: Payer: Self-pay | Admitting: Endocrinology

## 2015-04-08 ENCOUNTER — Other Ambulatory Visit: Payer: Self-pay | Admitting: *Deleted

## 2015-04-08 ENCOUNTER — Telehealth: Payer: Self-pay | Admitting: Endocrinology

## 2015-04-08 MED ORDER — METFORMIN HCL 1000 MG PO TABS: ORAL_TABLET | ORAL | Status: DC

## 2015-04-08 MED ORDER — LOSARTAN POTASSIUM 50 MG PO TABS: ORAL_TABLET | ORAL | Status: DC

## 2015-04-08 MED ORDER — SIMVASTATIN 20 MG PO TABS: ORAL_TABLET | ORAL | Status: DC

## 2015-04-08 NOTE — Telephone Encounter (Signed)
rx sent

## 2015-04-08 NOTE — Telephone Encounter (Signed)
Patient need refill of Simvastatin, Losartan, [facility]formin[facility]

## 2015-07-12 ENCOUNTER — Other Ambulatory Visit: Payer: Self-pay | Admitting: *Deleted

## 2015-07-12 ENCOUNTER — Telehealth: Payer: Self-pay | Admitting: Endocrinology

## 2015-07-12 MED ORDER — AZITHROMYCIN 250 MG PO TABS: ORAL_TABLET | ORAL | Status: DC

## 2015-07-12 NOTE — Telephone Encounter (Signed)
Z-Pak.  He can take OTC Imodium and cough medicine and Tylenol

## 2015-07-12 NOTE — Telephone Encounter (Signed)
Pt called and said he has a bad cold and he just got the flu shot and wants to talk to the nurse to tell her what is going on so Dr. Lucianne Muss can call him in something.

## 2015-07-12 NOTE — Telephone Encounter (Signed)
Scott Reynolds called, he's been having symptoms since Thursday.  No Fever Severe diarrhea  Had body aches but said he isn't having them today. He said his bones feel stiff and hurt He is coughing up a gold color mucus.  Please advise. Would like something called into [facility] on [address].

## 2015-07-12 NOTE — Telephone Encounter (Signed)
Tried calling Mr. Klinck back, but his phone said that he is not taking incoming calls?

## 2015-07-12 NOTE — Telephone Encounter (Signed)
Pt calling you back [phone]

## 2015-07-12 NOTE — Telephone Encounter (Signed)
Noted, rx sent, detailed message left on patients voicemail.

## 2015-09-23 ENCOUNTER — Ambulatory Visit: Payer: Medicare Other | Admitting: Endocrinology

## 2015-12-01 ENCOUNTER — Other Ambulatory Visit (INDEPENDENT_AMBULATORY_CARE_PROVIDER_SITE_OTHER): Payer: Medicare Other

## 2015-12-01 ENCOUNTER — Other Ambulatory Visit: Payer: Self-pay | Admitting: Endocrinology

## 2015-12-01 ENCOUNTER — Ambulatory Visit: Payer: Medicare Other | Admitting: Endocrinology

## 2015-12-01 DIAGNOSIS — E119 Type 2 diabetes mellitus without complications: Secondary | ICD-10-CM

## 2015-12-01 LAB — URINALYSIS, ROUTINE W REFLEX MICROSCOPIC
BILIRUBIN URINE: NEGATIVE
Hgb urine dipstick: NEGATIVE
Ketones, ur: NEGATIVE
Leukocytes, UA: NEGATIVE
Nitrite: NEGATIVE
PH: 6.5 (ref 5.0–8.0)
RBC / HPF: NONE SEEN (ref 0–?)
Total Protein, Urine: NEGATIVE
Urine Glucose: NEGATIVE
Urobilinogen, UA: 0.2 (ref 0.0–1.0)
WBC UA: NONE SEEN (ref 0–?)

## 2015-12-01 LAB — COMPREHENSIVE METABOLIC PANEL
ALT: 8 U/L (ref 0–53)
AST: 14 U/L (ref 0–37)
Albumin: 4.5 g/dL (ref 3.5–5.2)
Alkaline Phosphatase: 53 U/L (ref 39–117)
BUN: 19 mg/dL (ref 6–23)
CHLORIDE: 103 meq/L (ref 96–112)
CO2: 31 mEq/L (ref 19–32)
Calcium: 10 mg/dL (ref 8.4–10.5)
Creatinine, Ser: 0.95 mg/dL (ref 0.40–1.50)
GFR: 99.16 mL/min (ref >60.00)
GLUCOSE: 99 mg/dL (ref 70–99)
POTASSIUM: 4.2 meq/L (ref 3.5–5.1)
SODIUM: 139 meq/L (ref 135–145)
Total Bilirubin: 1.2 mg/dL (ref 0.2–1.2)
Total Protein: 7.7 g/dL (ref 6.0–8.3)

## 2015-12-01 LAB — LIPID PANEL
CHOL/HDL RATIO: 2
Cholesterol: 138 mg/dL (ref 0–200)
HDL: 55.8 mg/dL (ref >39.00)
LDL CALC: 72 mg/dL (ref 0–99)
NONHDL: 81.81
Triglycerides: 47 mg/dL (ref 0.0–149.0)
VLDL: 9.4 mg/dL (ref 0.0–40.0)

## 2015-12-01 LAB — MICROALBUMIN / CREATININE URINE RATIO
Creatinine,U: 60.6 mg/dL
Microalb Creat Ratio: 1.2 mg/g (ref 0.0–30.0)

## 2015-12-01 LAB — HEMOGLOBIN A1C: HEMOGLOBIN A1C: 5.2 % (ref 4.6–6.5)

## 2015-12-21 ENCOUNTER — Encounter: Payer: Self-pay | Admitting: Endocrinology

## 2015-12-21 ENCOUNTER — Ambulatory Visit (INDEPENDENT_AMBULATORY_CARE_PROVIDER_SITE_OTHER): Payer: Medicare Other | Admitting: Endocrinology

## 2015-12-21 VITALS — BP 132/78 | HR 77 | Ht 70.0 in | Wt 203.0 lb

## 2015-12-21 DIAGNOSIS — E119 Type 2 diabetes mellitus without complications: Secondary | ICD-10-CM | POA: Diagnosis not present

## 2015-12-21 NOTE — Progress Notes (Signed)
Patient ID: Scott Reynolds, male   DOB: Jul 29, 1940, 75 y.o.   MRN: [id number]   Reason for Appointment:  Diabetes follow-up   History of Present Illness   He has had diabetes since 2001 which is usually well controlled.  Previously was taking glipizide and Actoplusmet but since about 2013  has been able to get control with metformin 2000 mg only.   He again has been motivated to be compliant with his diet with making healthy choices and reduced portions Since his last visit 6 months ago his weight has gone up slightly again A1c is excellent at 5.2, not clear if this is completely representative of his blood sugars  He still does not monitor his blood sugar despite giving him new meters He is not doing any formal exercise but periodically walking or other activities outside  Monitors blood glucose: None    Glucometer:  ?  Does not have one  Blood Glucose readings: Does not check blood glucose levels.   Food preferences: low fat. Small amounts of carbs Physical activity: exercise: Yard work periodically    Complications: bilateral leg amputation  Wt Readings from Last 3 Encounters:  12/21/15 203 lb (92.1 kg)  03/26/15 198 lb 9.6 oz (90.1 kg)  09/23/14 195 lb (88.5 kg)   Lab Results  Component Value Date   HGBA1C 5.2 12/01/2015   HGBA1C 4.9 03/26/2015   HGBA1C 5.0 09/23/2014   Lab Results  Component Value Date   MICROALBUR <0.7 12/01/2015   LDLCALC 72 12/01/2015   CREATININE 0.95 12/01/2015      HYPERLIPIDEMIA:         The lipid abnormality consists of elevated LDL; he has been compliant with simvastatin 20 mg with good control  Lab Results  Component Value Date   CHOL 138 12/01/2015   HDL 55.80 12/01/2015   LDLCALC 72 12/01/2015   TRIG 47.0 12/01/2015   CHOLHDL 2 12/01/2015     PREVENTIVE CARE:  He did not send in his Hemoccult     Medication List       Accurate as of 12/21/15  1:25 PM. Always use your most recent med list.          aspirin  81 MG tablet Take 81 mg by mouth daily.   azithromycin 250 MG tablet Commonly known as:  ZITHROMAX Z-PAK Take 2 tablets the first day, the one tablet daily until finished.   bimatoprost 0.01 % Soln Commonly known as:  LUMIGAN 1 drop at bedtime.   brimonidine 0.15 % ophthalmic solution Commonly known as:  ALPHAGAN   COMBIGAN 0.2-0.5 % ophthalmic solution Generic drug:  brimonidine-timolol   dorzolamide 2 % ophthalmic solution Commonly known as:  TRUSOPT   glucose blood test strip Commonly known as:  ONE TOUCH ULTRA TEST Use as instructed to check blood sugar 3 times per week dx code E11.9   latanoprost 0.005 % ophthalmic solution Commonly known as:  XALATAN   losartan 50 MG tablet Commonly known as:  COZAAR TAKE 1 BY MOUTH DAILY   metFORMIN 1000 MG tablet Commonly known as:  GLUCOPHAGE TAKE 1 BY MOUTH TWICE DAILY   prednisoLONE acetate 1 % ophthalmic suspension Commonly known as:  PRED FORTE   simvastatin 20 MG tablet Commonly known as:  ZOCOR TAKE 1 BY MOUTH AT BEDTIME   timolol 0.5 % ophthalmic solution Commonly known as:  TIMOPTIC       Allergies: No Known Allergies  Past Medical History:  Diagnosis Date  .  Diabetes mellitus without complication (HCC)   . Erectile dysfunction   . Glaucoma   . Hyperlipidemia   . Hypertension     Past Surgical History:  Procedure Laterality Date  . LEG AMPUTATION ABOVE KNEE Bilateral    2001 left leg and 2003 right leg    Family History  Problem Relation Age of Onset  . Diabetes Brother     Social History:  reports that he has never smoked. He does not have any smokeless tobacco history on file. His alcohol and drug histories are not on file.   ROS  Lab Results  Component Value Date   OCCULTBLD Negative 09/10/2013   He has regular eye exams, recent report not available   Hypertension:    His blood pressure has been controlled with losartan 50 mg only. Not monitoring at home.    Examination:   BP  132/78 (BP Location: Left Arm, Patient Position: Sitting, Cuff Size: Normal)   Pulse 77   Ht  (1.778 m)   Wt 203 lb (92.1 kg)   SpO2 97%   BMI 29.13 kg/m   Body mass index is 29.13 kg/m.   Physical Exam      Assesment:   Diabetes type 2 with mild obesity Currently on metformin 1 g twice a day  Again his diabetes control appears excellent since he has been able to follow a healthy diet He can be more active but he is having difficulty dining the time because of taking care of his wife  A1c is in the nondiabetic range again Since blood sugar control is excellent and consistent will not have him monitor at home, he is generally noncompliant with this anyway  HYPERTENSION: Blood pressure is controlled with  low-dose losartan, will continue same dose   LIPIDS controlled with simvastatin  Will have him get reports of his eye exam from the last visit  He will be given another Hemoccult today  Eddie Koc 12/21/2015, 1:25 PM

## 2016-02-09 ENCOUNTER — Other Ambulatory Visit: Payer: Self-pay | Admitting: *Deleted

## 2016-02-09 MED ORDER — SIMVASTATIN 20 MG PO TABS: ORAL_TABLET | ORAL | 1 refills | Status: DC

## 2016-02-09 MED ORDER — LOSARTAN POTASSIUM 50 MG PO TABS: ORAL_TABLET | ORAL | 1 refills | Status: DC

## 2016-02-09 MED ORDER — METFORMIN HCL 1000 MG PO TABS: ORAL_TABLET | ORAL | 1 refills | Status: DC

## 2016-06-26 ENCOUNTER — Ambulatory Visit: Payer: Medicare Other | Admitting: Endocrinology

## 2016-07-05 ENCOUNTER — Encounter: Payer: Self-pay | Admitting: Endocrinology

## 2016-07-05 ENCOUNTER — Ambulatory Visit (INDEPENDENT_AMBULATORY_CARE_PROVIDER_SITE_OTHER): Payer: Medicare Other | Admitting: Endocrinology

## 2016-07-05 VITALS — BP 144/80 | HR 80 | Ht 70.0 in | Wt 208.0 lb

## 2016-07-05 DIAGNOSIS — Z23 Encounter for immunization: Secondary | ICD-10-CM

## 2016-07-05 DIAGNOSIS — Z89612 Acquired absence of left leg above knee: Secondary | ICD-10-CM

## 2016-07-05 DIAGNOSIS — E119 Type 2 diabetes mellitus without complications: Secondary | ICD-10-CM | POA: Diagnosis not present

## 2016-07-05 DIAGNOSIS — Z89611 Acquired absence of right leg above knee: Secondary | ICD-10-CM | POA: Diagnosis not present

## 2016-07-05 DIAGNOSIS — Z89511 Acquired absence of right leg below knee: Secondary | ICD-10-CM | POA: Insufficient documentation

## 2016-07-05 LAB — LIPID PANEL
CHOL/HDL RATIO: 3
Cholesterol: 134 mg/dL (ref 0–200)
HDL: 50.7 mg/dL (ref >39.00)
LDL Cholesterol: 73 mg/dL (ref 0–99)
NONHDL: 83.32
TRIGLYCERIDES: 52 mg/dL (ref 0.0–149.0)
VLDL: 10.4 mg/dL (ref 0.0–40.0)

## 2016-07-05 NOTE — Progress Notes (Signed)
Patient ID: Scott Reynolds, male   DOB: [birth year], 76 y.o.   MRN: [id number]   Reason for Appointment:  Diabetes follow-up   History of Present Illness   He has had diabetes since 2001 which is usually well controlled.  Previously was taking glipizide and Actoplusmet but since about 2013  has been able to get control with metformin 2000 mg only.   Recent history, management and blood sugars:  A1c is pending.  He has gained a significant amount of weight since his last visit and he thinks this is mostly from not exercising which he otherwise does.  He still thinks he is watching his portions and getting one meal from [employer].  He is not motivated to monitor his blood sugar at home    Glucometer:    Does not have one  Blood Glucose readings: Does not check blood glucose levels.   Food preferences: low fat. Small amounts of portions Physical activity: exercise: Yard work periodically, none recently    Complications: bilateral leg amputation  Wt Readings from Last 3 Encounters:  07/05/16 208 lb (94.3 kg)  12/21/15 203 lb (92.1 kg)  03/26/15 198 lb 9.6 oz (90.1 kg)   Lab Results  Component Value Date   HGBA1C 5.2 12/01/2015   HGBA1C 4.9 03/26/2015   HGBA1C 5.0 09/23/2014   Lab Results  Component Value Date   MICROALBUR <0.7 12/01/2015   LDLCALC 72 12/01/2015   CREATININE 0.95 12/01/2015      HYPERLIPIDEMIA:         The lipid abnormality consists of elevated LDL; he has been compliant with simvastatin 20 mg with good control  Lab Results  Component Value Date   CHOL 138 12/01/2015   HDL 55.80 12/01/2015   LDLCALC 72 12/01/2015   TRIG 47.0 12/01/2015   CHOLHDL 2 12/01/2015      Allergies as of 07/05/2016   No Known Allergies     Medication List       Accurate as of 07/05/16  2:01 PM. Always use your most recent med list.          aspirin 81 MG tablet Take 81 mg by mouth daily.   azithromycin 250 MG tablet Commonly known as:   ZITHROMAX Z-PAK Take 2 tablets the first day, the one tablet daily until finished.   bimatoprost 0.01 % Soln Commonly known as:  LUMIGAN 1 drop at bedtime.   brimonidine 0.15 % ophthalmic solution Commonly known as:  ALPHAGAN   COMBIGAN 0.2-0.5 % ophthalmic solution Generic drug:  brimonidine-timolol   dorzolamide 2 % ophthalmic solution Commonly known as:  TRUSOPT   glucose blood test strip Commonly known as:  ONE TOUCH ULTRA TEST Use as instructed to check blood sugar 3 times per week dx code E11.9   latanoprost 0.005 % ophthalmic solution Commonly known as:  XALATAN   losartan 50 MG tablet Commonly known as:  COZAAR TAKE 1 BY MOUTH DAILY   metFORMIN 1000 MG tablet Commonly known as:  GLUCOPHAGE TAKE 1 BY MOUTH TWICE DAILY   prednisoLONE acetate 1 % ophthalmic suspension Commonly known as:  PRED FORTE   simvastatin 20 MG tablet Commonly known as:  ZOCOR TAKE 1 BY MOUTH AT BEDTIME   timolol 0.5 % ophthalmic solution Commonly known as:  TIMOPTIC       Allergies: No Known Allergies  Past Medical History:  Diagnosis Date  . Diabetes mellitus without complication (HCC)   . Erectile dysfunction   . Glaucoma   .  Hyperlipidemia   . Hypertension     Past Surgical History:  Procedure Laterality Date  . LEG AMPUTATION ABOVE KNEE Bilateral    2001 left leg and 2003 right leg    Family History  Problem Relation Age of Onset  . Diabetes Brother     Social History:  reports that he has never smoked. He has never used smokeless tobacco. His alcohol and drug histories are not on file.   ROS  He has regular eye exams And is followed at least twice a year for his glaucoma   Hypertension:    His blood pressure has been controlled with losartan 50 mg on the blood pressure is higher today He thinks this is from getting his blood pressure medication last night.  Not monitoring at home.  He has not had a PCP established   Examination:   BP (!) 144/80    Pulse 80   Ht 5\' 10"  (1.778 m)   Wt 208 lb (94.3 kg)   SpO2 97%   BMI 29.84 kg/m   Body mass index is 29.84 kg/m.   Physical Exam    Assesment:   Diabetes type 2 with mild obesity Currently on metformin 1 g twice a day A1c needs to be checked today Currently not monitoring at home  Discussed his weight gain and he thinks he can get his weight back down by starting exercise like yardwork and chopping wood. He will also try to watch his meals and snacks for calorie restriction  HYPERTENSION: Blood pressure is usually controlled with  low-dose losartan, blood pressure higher today because of his not taking his losartan last night. For now will continue same dose   LIPIDS controlled with simvastatin, needs follow-up  Will have him get reports of his eye exam from the last visit  Given him a contact number for PCP to be established for general care  [facility] 07/05/2016, 2:01 PM   Office Visit on 07/05/2016  Component Date Value Ref Range Status  . Cholesterol 07/05/2016 134  0 - 200 mg/dL Final  . Triglycerides 07/05/2016 52.0  0.0 - 149.0 mg/dL Final  . HDL 40/98/1191 50.70  >39.00 mg/dL Final  . VLDL 47/82/9562 10.4  0.0 - 40.0 mg/dL Final  . LDL Cholesterol 07/05/2016 73  0 - 99 mg/dL Final  . Total CHOL/HDL Ratio 07/05/2016 3   Final  . NonHDL 07/05/2016 83.32   Final

## 2016-07-06 ENCOUNTER — Other Ambulatory Visit (INDEPENDENT_AMBULATORY_CARE_PROVIDER_SITE_OTHER): Payer: Medicare Other

## 2016-07-06 DIAGNOSIS — E119 Type 2 diabetes mellitus without complications: Secondary | ICD-10-CM | POA: Diagnosis not present

## 2016-07-06 DIAGNOSIS — Z23 Encounter for immunization: Secondary | ICD-10-CM | POA: Diagnosis not present

## 2016-07-06 LAB — COMPREHENSIVE METABOLIC PANEL
ALBUMIN: 4.7 g/dL (ref 3.5–5.2)
ALT: 9 U/L (ref 0–53)
AST: 16 U/L (ref 0–37)
Alkaline Phosphatase: 64 U/L (ref 39–117)
BUN: 18 mg/dL (ref 6–23)
CHLORIDE: 102 meq/L (ref 96–112)
CO2: 28 mEq/L (ref 19–32)
Calcium: 9.9 mg/dL (ref 8.4–10.5)
Creatinine, Ser: 0.98 mg/dL (ref 0.40–1.50)
GFR: 95.52 mL/min (ref >60.00)
Glucose, Bld: 88 mg/dL (ref 70–99)
POTASSIUM: 4.2 meq/L (ref 3.5–5.1)
Sodium: 140 mEq/L (ref 135–145)
Total Bilirubin: 1.2 mg/dL (ref 0.2–1.2)
Total Protein: 8.1 g/dL (ref 6.0–8.3)

## 2016-07-06 NOTE — Addendum Note (Signed)
Addended by: Bobbye RiggsWALKER, LISA L on: 07/06/2016 08:34 AM   Modules accepted: Orders

## 2016-09-19 ENCOUNTER — Other Ambulatory Visit: Payer: Self-pay

## 2016-09-19 MED ORDER — METFORMIN HCL 1000 MG PO TABS: ORAL_TABLET | ORAL | 1 refills | Status: DC

## 2016-09-19 MED ORDER — LOSARTAN POTASSIUM 50 MG PO TABS: ORAL_TABLET | ORAL | 1 refills | Status: DC

## 2016-09-19 MED ORDER — SIMVASTATIN 20 MG PO TABS: ORAL_TABLET | ORAL | 1 refills | Status: DC

## 2017-01-01 ENCOUNTER — Ambulatory Visit: Payer: Medicare Other | Admitting: Endocrinology

## 2017-01-02 ENCOUNTER — Ambulatory Visit: Payer: Medicare Other | Admitting: Endocrinology

## 2017-01-04 ENCOUNTER — Ambulatory Visit: Payer: Medicare Other | Admitting: Endocrinology

## 2017-01-23 ENCOUNTER — Encounter: Payer: Self-pay | Admitting: Endocrinology

## 2017-01-23 ENCOUNTER — Ambulatory Visit (INDEPENDENT_AMBULATORY_CARE_PROVIDER_SITE_OTHER): Payer: Medicare Other | Admitting: Endocrinology

## 2017-01-23 VITALS — BP 146/86 | HR 85 | Ht 70.0 in | Wt 202.4 lb

## 2017-01-23 DIAGNOSIS — E119 Type 2 diabetes mellitus without complications: Secondary | ICD-10-CM | POA: Diagnosis not present

## 2017-01-23 DIAGNOSIS — Z23 Encounter for immunization: Secondary | ICD-10-CM

## 2017-01-23 LAB — URINALYSIS, ROUTINE W REFLEX MICROSCOPIC
BILIRUBIN URINE: NEGATIVE
Hgb urine dipstick: NEGATIVE
KETONES UR: NEGATIVE
LEUKOCYTES UA: NEGATIVE
Nitrite: NEGATIVE
PH: 7 (ref 5.0–8.0)
RBC / HPF: NONE SEEN (ref 0–?)
Specific Gravity, Urine: 1.015 (ref 1.000–1.030)
Total Protein, Urine: NEGATIVE
UROBILINOGEN UA: 2 — AB (ref 0.0–1.0)
Urine Glucose: NEGATIVE

## 2017-01-23 LAB — LIPID PANEL
CHOLESTEROL: 117 mg/dL (ref 0–200)
HDL: 44.1 mg/dL (ref >39.00)
LDL Cholesterol: 63 mg/dL (ref 0–99)
NonHDL: 73.26
TRIGLYCERIDES: 49 mg/dL (ref 0.0–149.0)
Total CHOL/HDL Ratio: 3
VLDL: 9.8 mg/dL (ref 0.0–40.0)

## 2017-01-23 LAB — COMPREHENSIVE METABOLIC PANEL
ALBUMIN: 4.5 g/dL (ref 3.5–5.2)
ALT: 7 U/L (ref 0–53)
AST: 13 U/L (ref 0–37)
Alkaline Phosphatase: 56 U/L (ref 39–117)
BILIRUBIN TOTAL: 0.9 mg/dL (ref 0.2–1.2)
BUN: 22 mg/dL (ref 6–23)
CALCIUM: 9.9 mg/dL (ref 8.4–10.5)
CHLORIDE: 103 meq/L (ref 96–112)
CO2: 30 mEq/L (ref 19–32)
CREATININE: 0.99 mg/dL (ref 0.40–1.50)
GFR: 94.27 mL/min (ref >60.00)
Glucose, Bld: 106 mg/dL — ABNORMAL HIGH (ref 70–99)
Potassium: 4.6 mEq/L (ref 3.5–5.1)
SODIUM: 140 meq/L (ref 135–145)
Total Protein: 7.4 g/dL (ref 6.0–8.3)

## 2017-01-23 LAB — POCT GLYCOSYLATED HEMOGLOBIN (HGB A1C): HEMOGLOBIN A1C: 4.9

## 2017-01-23 LAB — MICROALBUMIN / CREATININE URINE RATIO
CREATININE, U: 139.8 mg/dL
MICROALB UR: 0.9 mg/dL (ref 0.0–1.9)
Microalb Creat Ratio: 0.6 mg/g (ref 0.0–30.0)

## 2017-01-23 NOTE — Progress Notes (Signed)
Patient ID: Scott Reynolds, male   DOB: April 24, 1941, 76 y.o.   MRN: [id number]   Reason for Appointment:  Diabetes follow-up   History of Present Illness   He has had diabetes since 2001 which is usually well controlled.  Previously was taking glipizide and Actoplusmet but since about 2013  has been able to get control with metformin 2000 mg only.   Recent history, management and blood sugars:  A1c is pending.  He has not been as active this summer although he is still trying to eat very healthy and watching portions and high-fat intake.  He still has been getting one meal from [employer].  He does not like to check his blood sugar at home  Glucometer:    Does not have one  Blood Glucose readings: Does not check blood glucose levels.   Food preferences: low fat. Small portions Physical activity: exercise: Yard work periodically,  Trying to stay active   Complications: bilateral leg amputation  Wt Readings from Last 3 Encounters:  01/23/17 202 lb 6.4 oz (91.8 kg)  07/05/16 208 lb (94.3 kg)  12/21/15 203 lb (92.1 kg)   Lab Results  Component Value Date   HGBA1C 5.2 12/01/2015   HGBA1C 4.9 03/26/2015   HGBA1C 5.0 09/23/2014   Lab Results  Component Value Date   MICROALBUR <0.7 12/01/2015   LDLCALC 73 07/05/2016   CREATININE 0.98 07/06/2016       HYPERLIPIDEMIA:         The lipid abnormality consists of elevated LDL; he has good control with Zocor 20 mg daily   Lab Results  Component Value Date   CHOL 134 07/05/2016   HDL 50.70 07/05/2016   LDLCALC 73 07/05/2016   TRIG 52.0 07/05/2016   CHOLHDL 3 07/05/2016      Allergies as of 01/23/2017   No Known Allergies     Medication List       Accurate as of 01/23/17 11:13 AM. Always use your most recent med list.          aspirin 81 MG tablet Take 81 mg by mouth daily.   bimatoprost 0.01 % Soln Commonly known as:  LUMIGAN 1 drop at bedtime.   brimonidine 0.15 % ophthalmic  solution Commonly known as:  ALPHAGAN   COMBIGAN 0.2-0.5 % ophthalmic solution Generic drug:  brimonidine-timolol   dorzolamide 2 % ophthalmic solution Commonly known as:  TRUSOPT   glucose blood test strip Commonly known as:  ONE TOUCH ULTRA TEST Use as instructed to check blood sugar 3 times per week dx code E11.9   latanoprost 0.005 % ophthalmic solution Commonly known as:  XALATAN   losartan 50 MG tablet Commonly known as:  COZAAR TAKE 1 BY MOUTH DAILY   metFORMIN 1000 MG tablet Commonly known as:  GLUCOPHAGE TAKE 1 BY MOUTH TWICE DAILY   prednisoLONE acetate 1 % ophthalmic suspension Commonly known as:  PRED FORTE   simvastatin 20 MG tablet Commonly known as:  ZOCOR TAKE 1 BY MOUTH AT BEDTIME   timolol 0.5 % ophthalmic solution Commonly known as:  TIMOPTIC       Allergies: No Known Allergies  Past Medical History:  Diagnosis Date  . Diabetes mellitus without complication (HCC)   . Erectile dysfunction   . Glaucoma   . Hyperlipidemia   . Hypertension     Past Surgical History:  Procedure Laterality Date  . LEG AMPUTATION ABOVE KNEE Bilateral    2001 left leg and 2003  right leg    Family History  Problem Relation Age of Onset  . Diabetes Brother     Social History:  reports that he has never smoked. He has never used smokeless tobacco. His alcohol and drug histories are not on file.   ROS  He has regular eye exams And is followed at least twice a year for his glaucoma   Hypertension:    His blood pressure has been controlled with losartan 50 mg     He has not had a PCP set up, was advised to do so on his last visit   Examination:   BP (!) 146/86   Pulse 85   Ht 5\' 10"  (1.778 m)   Wt 202 lb 6.4 oz (91.8 kg)   SpO2 98%   BMI 29.04 kg/m   Body mass index is 29.04 kg/m.   Physical Exam    Assesment:   Diabetes type 2 with mild obesity He has been consistently on metformin 1 g twice a day A1c needs to be checked today Currently  not monitoring at home and not clear if his blood sugars are controlled He is doing well with the diet at home and does need to start getting more active with some form of exercise  HYPERTENSION: Blood pressure is usually controlled with 50 mg losartan He will be monitored every 6 months  LIPIDS controlled with simvastatin, needs follow-up  Given him a contact number for PCP to be established for general care  Influenza vaccine given  [facility] 01/23/2017, 11:13 AM    ADDENDUM: All labs looking excellent  Office Visit on 01/23/2017  Component Date Value Ref Range Status  . Hemoglobin A1C 01/23/2017 4.9   Final  . Sodium 01/23/2017 140  135 - 145 mEq/L Final  . Potassium 01/23/2017 4.6  3.5 - 5.1 mEq/L Final  . Chloride 01/23/2017 103  96 - 112 mEq/L Final  . CO2 01/23/2017 30  19 - 32 mEq/L Final  . Glucose, Bld 01/23/2017 106* 70 - 99 mg/dL Final  . BUN 40/98/1191 22  6 - 23 mg/dL Final  . Creatinine, Ser 01/23/2017 0.99  0.40 - 1.50 mg/dL Final  . Total Bilirubin 01/23/2017 0.9  0.2 - 1.2 mg/dL Final  . Alkaline Phosphatase 01/23/2017 56  39 - 117 U/L Final  . AST 01/23/2017 13  0 - 37 U/L Final  . ALT 01/23/2017 7  0 - 53 U/L Final  . Total Protein 01/23/2017 7.4  6.0 - 8.3 g/dL Final  . Albumin 47/82/9562 4.5  3.5 - 5.2 g/dL Final  . Calcium 13/12/6576 9.9  8.4 - 10.5 mg/dL Final  . GFR 46/96/2952 94.27  >60.00 mL/min Final  . Microalb, Ur 01/23/2017 0.9  0.0 - 1.9 mg/dL Final  . Creatinine,U 84/13/2440 139.8  mg/dL Final  . Microalb Creat Ratio 01/23/2017 0.6  0.0 - 30.0 mg/g Final  . Color, Urine 01/23/2017 YELLOW  Yellow;Lt. Yellow Final  . APPearance 01/23/2017 CLEAR  Clear Final  . Specific Gravity, Urine 01/23/2017 1.015  1.000 - 1.030 Final  . pH 01/23/2017 7.0  5.0 - 8.0 Final  . Total Protein, Urine 01/23/2017 NEGATIVE  Negative Final  . Urine Glucose 01/23/2017 NEGATIVE  Negative Final  . Ketones, ur 01/23/2017 NEGATIVE  Negative Final  . Bilirubin Urine  01/23/2017 NEGATIVE  Negative Final  . Hgb urine dipstick 01/23/2017 NEGATIVE  Negative Final  . Urobilinogen, UA 01/23/2017 2.0* 0.0 - 1.0 Final  . Leukocytes, UA 01/23/2017 NEGATIVE  Negative  Final  . Nitrite 01/23/2017 NEGATIVE  Negative Final  . WBC, UA 01/23/2017 0-2/hpf  0-2/hpf Final  . RBC / HPF 01/23/2017 none seen  0-2/hpf Final  . Squamous Epithelial / LPF 01/23/2017 Rare(0-4/hpf)  Rare(0-4/hpf) Final  . Cholesterol 01/23/2017 117  0 - 200 mg/dL Final   ATP III Classification       Desirable:  < 200 mg/dL               Borderline High:  200 - 239 mg/dL          High:  > = 409240 mg/dL  . Triglycerides 01/23/2017 49.0  0.0 - 149.0 mg/dL Final   Normal:  <811<150 mg/dLBorderline High:  150 - 199 mg/dL  . HDL 01/23/2017 44.10  >39.00 mg/dL Final  . VLDL 91/47/829509/08/2016 9.8  0.0 - 62.140.0 mg/dL Final  . LDL Cholesterol 01/23/2017 63  0 - 99 mg/dL Final  . Total CHOL/HDL Ratio 01/23/2017 3   Final                  Men          Women1/2 Average Risk     3.4          3.3Average Risk          5.0          4.42X Average Risk          9.6          7.13X Average Risk          15.0          11.0                      . NonHDL 01/23/2017 73.26   Final   NOTE:  Non-HDL goal should be 30 mg/dL higher than patient's LDL goal (i.e. LDL goal of < 70 mg/dL, would have non-HDL goal of < 100 mg/dL)

## 2017-03-21 ENCOUNTER — Other Ambulatory Visit: Payer: Self-pay

## 2017-03-21 MED ORDER — METFORMIN HCL 1000 MG PO TABS: ORAL_TABLET | ORAL | 1 refills | Status: DC

## 2017-03-21 MED ORDER — SIMVASTATIN 20 MG PO TABS: ORAL_TABLET | ORAL | 1 refills | Status: DC

## 2017-03-21 MED ORDER — LOSARTAN POTASSIUM 50 MG PO TABS: ORAL_TABLET | ORAL | 1 refills | Status: DC

## 2017-06-27 DIAGNOSIS — E119 Type 2 diabetes mellitus without complications: Secondary | ICD-10-CM | POA: Diagnosis not present

## 2017-06-27 DIAGNOSIS — H35371 Puckering of macula, right eye: Secondary | ICD-10-CM | POA: Diagnosis not present

## 2017-06-27 DIAGNOSIS — H35031 Hypertensive retinopathy, right eye: Secondary | ICD-10-CM | POA: Diagnosis not present

## 2017-06-27 DIAGNOSIS — H401113 Primary open-angle glaucoma, right eye, severe stage: Secondary | ICD-10-CM | POA: Diagnosis not present

## 2017-07-02 ENCOUNTER — Telehealth: Payer: Self-pay | Admitting: Endocrinology

## 2017-07-02 MED ORDER — SIMVASTATIN 20 MG PO TABS: ORAL_TABLET | ORAL | 1 refills | Status: DC

## 2017-07-02 MED ORDER — LOSARTAN POTASSIUM 50 MG PO TABS: ORAL_TABLET | ORAL | 1 refills | Status: DC

## 2017-07-02 MED ORDER — METFORMIN HCL 1000 MG PO TABS: ORAL_TABLET | ORAL | 1 refills | Status: DC

## 2017-07-02 NOTE — Telephone Encounter (Signed)
Pt needs script sent to pharmacy for the following, Thanks!   metFORMIN (GLUCOPHAGE) 1000 MG tablet  simvastatin (ZOCOR) 20 MG tablet    losartan (COZAAR) 50 MG tablet    [id number] - [place], [place] - [address]

## 2017-07-02 NOTE — Telephone Encounter (Signed)
Sent!

## 2017-07-23 ENCOUNTER — Ambulatory Visit: Payer: Medicare Other | Admitting: Endocrinology

## 2017-08-02 ENCOUNTER — Encounter: Payer: Self-pay | Admitting: Endocrinology

## 2017-08-02 ENCOUNTER — Ambulatory Visit: Payer: PPO | Admitting: Endocrinology

## 2017-08-02 VITALS — BP 140/78 | HR 92 | Wt 215.0 lb

## 2017-08-02 DIAGNOSIS — E119 Type 2 diabetes mellitus without complications: Secondary | ICD-10-CM | POA: Diagnosis not present

## 2017-08-02 DIAGNOSIS — Z125 Encounter for screening for malignant neoplasm of prostate: Secondary | ICD-10-CM | POA: Diagnosis not present

## 2017-08-02 DIAGNOSIS — E7800 Pure hypercholesterolemia, unspecified: Secondary | ICD-10-CM | POA: Diagnosis not present

## 2017-08-02 DIAGNOSIS — Z89611 Acquired absence of right leg above knee: Secondary | ICD-10-CM

## 2017-08-02 DIAGNOSIS — Z89612 Acquired absence of left leg above knee: Secondary | ICD-10-CM

## 2017-08-02 LAB — COMPREHENSIVE METABOLIC PANEL
ALT: 10 U/L (ref 0–53)
AST: 11 U/L (ref 0–37)
Albumin: 4.6 g/dL (ref 3.5–5.2)
Alkaline Phosphatase: 57 U/L (ref 39–117)
BUN: 16 mg/dL (ref 6–23)
CALCIUM: 10 mg/dL (ref 8.4–10.5)
CHLORIDE: 102 meq/L (ref 96–112)
CO2: 35 meq/L — AB (ref 19–32)
Creatinine, Ser: 0.96 mg/dL (ref 0.40–1.50)
GFR: 97.54 mL/min (ref >60.00)
Glucose, Bld: 99 mg/dL (ref 70–99)
Potassium: 4.5 mEq/L (ref 3.5–5.1)
Sodium: 141 mEq/L (ref 135–145)
Total Bilirubin: 0.9 mg/dL (ref 0.2–1.2)
Total Protein: 7.4 g/dL (ref 6.0–8.3)

## 2017-08-02 LAB — CBC
HCT: 40 % (ref 39.0–52.0)
HEMOGLOBIN: 13.3 g/dL (ref 13.0–17.0)
MCHC: 33.2 g/dL (ref 30.0–36.0)
MCV: 91.4 fl (ref 78.0–100.0)
PLATELETS: 178 10*3/uL (ref 150.0–400.0)
RBC: 4.37 Mil/uL (ref 4.22–5.81)
RDW: 13.1 % (ref 11.5–15.5)
WBC: 6.3 10*3/uL (ref 4.0–10.5)

## 2017-08-02 LAB — LIPID PANEL
CHOL/HDL RATIO: 2
Cholesterol: 107 mg/dL (ref 0–200)
HDL: 46.3 mg/dL (ref >39.00)
LDL Cholesterol: 52 mg/dL (ref 0–99)
NONHDL: 60.81
Triglycerides: 46 mg/dL (ref 0.0–149.0)
VLDL: 9.2 mg/dL (ref 0.0–40.0)

## 2017-08-02 LAB — POCT GLYCOSYLATED HEMOGLOBIN (HGB A1C): HEMOGLOBIN A1C: 5.3

## 2017-08-02 LAB — PSA, MEDICARE: PSA: 2.52 ng/ml (ref 0.10–4.00)

## 2017-08-02 MED ORDER — GLUCOSE BLOOD VI STRP: ORAL_STRIP | 5 refills | Status: DC

## 2017-08-02 NOTE — Patient Instructions (Signed)
Losartan 2 pills in am

## 2017-08-02 NOTE — Progress Notes (Signed)
Please call to let patient know that the lab results are normal and no further action needed

## 2017-08-02 NOTE — Progress Notes (Signed)
Patient ID: Scott Reynolds, male   DOB: [birth year], 77 y.o.   MRN: [id number]   Reason for Appointment:  Diabetes follow-up   History of Present Illness   He has had diabetes since 2001 which is usually well controlled.  Previously was taking glipizide and Actoplusmet but since about 2013  has been able to get control with metformin 2000 mg only.   Recent history, management and blood sugars:  A1c is pending.  He has not been as active this summer although he is still trying to eat very healthy and watching portions and high-fat intake.  He still has been getting one meal from [employer].  He does not like to check his blood sugar at home   Blood Glucose readings: Does not check blood glucose levels even though he has a meter, not clear if this is a One Touch Ultra.   Food preferences: low fat. Small portions  Physical activity: exercise: Minimal recently   Complications: bilateral leg amputation  Wt Readings from Last 3 Encounters:  08/02/17 215 lb (97.5 kg)  01/23/17 202 lb 6.4 oz (91.8 kg)  07/05/16 208 lb (94.3 kg)   Lab Results  Component Value Date   HGBA1C 5.3 08/02/2017   HGBA1C 4.9 01/23/2017   HGBA1C 5.2 12/01/2015   Lab Results  Component Value Date   MICROALBUR 0.9 01/23/2017   LDLCALC 63 01/23/2017   CREATININE 0.99 01/23/2017       HYPERLIPIDEMIA:         The lipid abnormality consists of elevated LDL; he has good control with Zocor 20 mg daily   Lab Results  Component Value Date   CHOL 117 01/23/2017   HDL 44.10 01/23/2017   LDLCALC 63 01/23/2017   TRIG 49.0 01/23/2017   CHOLHDL 3 01/23/2017   HYPERTENSION: See review of system     Allergies as of 08/02/2017   No Known Allergies     Medication List        Accurate as of 08/02/17 11:01 AM. Always use your most recent med list.          aspirin 81 MG tablet Take 81 mg by mouth daily.   bimatoprost 0.01 % Soln Commonly known as:  LUMIGAN 1 drop at bedtime.     brimonidine 0.15 % ophthalmic solution Commonly known as:  ALPHAGAN   COMBIGAN 0.2-0.5 % ophthalmic solution Generic drug:  brimonidine-timolol   dorzolamide 2 % ophthalmic solution Commonly known as:  TRUSOPT   glucose blood test strip Commonly known as:  ONE TOUCH ULTRA TEST Use as instructed to check blood sugar 3 times per week dx code E11.9   latanoprost 0.005 % ophthalmic solution Commonly known as:  XALATAN   losartan 50 MG tablet Commonly known as:  COZAAR TAKE 1 BY MOUTH DAILY   metFORMIN 1000 MG tablet Commonly known as:  GLUCOPHAGE TAKE 1 BY MOUTH TWICE DAILY   prednisoLONE acetate 1 % ophthalmic suspension Commonly known as:  PRED FORTE   simvastatin 20 MG tablet Commonly known as:  ZOCOR TAKE 1 BY MOUTH AT BEDTIME   timolol 0.5 % ophthalmic solution Commonly known as:  TIMOPTIC       Allergies: No Known Allergies  Past Medical History:  Diagnosis Date  . Diabetes mellitus without complication (HCC)   . Erectile dysfunction   . Glaucoma   . Hyperlipidemia   . Hypertension     Past Surgical History:  Procedure Laterality Date  . LEG AMPUTATION ABOVE  KNEE Bilateral    2001 left leg and 2003 right leg    Family History  Problem Relation Age of Onset  . Diabetes Brother   . Cancer Neg Hx     Social History:  reports that  has never smoked. he has never used smokeless tobacco. His alcohol and drug histories are not on file.   ROS  He has regular eye exams And is followed at least twice a year for his glaucoma   Hypertension:    His blood pressure has been controlled with losartan 50 mg Blood pressure was higher on first measurement  BP Readings from Last 3 Encounters:  08/02/17 140/78  01/23/17 (!) 146/86  07/05/16 (!) 144/80    He has not had a PCP established at the end, was advised to do so on his last visits   Examination:   BP 140/78 (Cuff Size: Normal)   Pulse 92   Wt 215 lb (97.5 kg)   SpO2 98%   BMI 30.85 kg/m    Body mass index is 30.85 kg/m.   Physical Exam    Assesment:   Diabetes type 2 with mild obesity  He has been consistently well controlled on metformin 1 g twice a day   A1c is relatively low usually considering his Diagnosis of diabetes and blood sugars mildly increased on the lab  Currently not monitoring at home; encouraged him to start monitoring periodically since he is being seen only every 6 months To make sure his blood sugar do not go up in the interim  Since his last visit he has gained significant amount of weight and he admits that this is from getting larger portions and not exercising He thinks he can try and get back into his better lifestyle again  HYPERTENSION: Blood pressure is controlled with 50 mg losartan He will be monitored every 6 months  LIPIDS controlled with simvastatin, needs follow-up  Given him a contact number for PCP to be established for general care, emphasized need for general preventive care  PSA to be checked today because of family history of prostate cancer  Reather LittlerAjay Mirta Mally 08/02/2017, 11:01 AM

## 2017-10-03 DIAGNOSIS — H401113 Primary open-angle glaucoma, right eye, severe stage: Secondary | ICD-10-CM | POA: Diagnosis not present

## 2017-10-03 DIAGNOSIS — H44512 Absolute glaucoma, left eye: Secondary | ICD-10-CM | POA: Diagnosis not present

## 2017-10-03 DIAGNOSIS — H35031 Hypertensive retinopathy, right eye: Secondary | ICD-10-CM | POA: Diagnosis not present

## 2017-10-03 DIAGNOSIS — H35371 Puckering of macula, right eye: Secondary | ICD-10-CM | POA: Diagnosis not present

## 2017-12-25 ENCOUNTER — Telehealth: Payer: Self-pay | Admitting: Endocrinology

## 2017-12-25 ENCOUNTER — Other Ambulatory Visit: Payer: Self-pay | Admitting: Endocrinology

## 2017-12-25 NOTE — Telephone Encounter (Signed)
Sent!

## 2017-12-25 NOTE — Telephone Encounter (Signed)
metFORMIN (GLUCOPHAGE) 1000 MG tablet  And his blood pressure medication   losartan (COZAAR) 50 MG tablet   [id number] - [place], [place] - [address]

## 2018-02-01 ENCOUNTER — Ambulatory Visit: Payer: PPO | Admitting: Endocrinology

## 2018-02-06 DIAGNOSIS — H401113 Primary open-angle glaucoma, right eye, severe stage: Secondary | ICD-10-CM | POA: Diagnosis not present

## 2018-02-06 DIAGNOSIS — E119 Type 2 diabetes mellitus without complications: Secondary | ICD-10-CM | POA: Diagnosis not present

## 2018-02-06 DIAGNOSIS — H35371 Puckering of macula, right eye: Secondary | ICD-10-CM | POA: Diagnosis not present

## 2018-02-06 DIAGNOSIS — H35031 Hypertensive retinopathy, right eye: Secondary | ICD-10-CM | POA: Diagnosis not present

## 2018-04-05 ENCOUNTER — Other Ambulatory Visit: Payer: Self-pay | Admitting: Endocrinology

## 2018-04-22 ENCOUNTER — Ambulatory Visit: Payer: PPO | Admitting: Endocrinology

## 2018-04-22 ENCOUNTER — Encounter: Payer: Self-pay | Admitting: Endocrinology

## 2018-04-22 VITALS — BP 130/68 | HR 87 | Ht 70.0 in | Wt 210.4 lb

## 2018-04-22 DIAGNOSIS — E119 Type 2 diabetes mellitus without complications: Secondary | ICD-10-CM

## 2018-04-22 DIAGNOSIS — Z23 Encounter for immunization: Secondary | ICD-10-CM | POA: Diagnosis not present

## 2018-04-22 LAB — COMPREHENSIVE METABOLIC PANEL
ALT: 10 U/L (ref 0–53)
AST: 14 U/L (ref 0–37)
Albumin: 4.5 g/dL (ref 3.5–5.2)
Alkaline Phosphatase: 68 U/L (ref 39–117)
BILIRUBIN TOTAL: 0.8 mg/dL (ref 0.2–1.2)
BUN: 16 mg/dL (ref 6–23)
CO2: 30 mEq/L (ref 19–32)
Calcium: 9.8 mg/dL (ref 8.4–10.5)
Chloride: 101 mEq/L (ref 96–112)
Creatinine, Ser: 0.91 mg/dL (ref 0.40–1.50)
GFR: 103.56 mL/min (ref >60.00)
Glucose, Bld: 118 mg/dL — ABNORMAL HIGH (ref 70–99)
Potassium: 4.1 mEq/L (ref 3.5–5.1)
Sodium: 139 mEq/L (ref 135–145)
TOTAL PROTEIN: 7.7 g/dL (ref 6.0–8.3)

## 2018-04-22 LAB — POCT GLYCOSYLATED HEMOGLOBIN (HGB A1C): Hemoglobin A1C: 5.1 % (ref 4.0–5.6)

## 2018-04-22 LAB — LIPID PANEL
Cholesterol: 134 mg/dL (ref 0–200)
HDL: 53.8 mg/dL (ref >39.00)
LDL Cholesterol: 70 mg/dL (ref 0–99)
NonHDL: 79.96
Total CHOL/HDL Ratio: 2
Triglycerides: 52 mg/dL (ref 0.0–149.0)
VLDL: 10.4 mg/dL (ref 0.0–40.0)

## 2018-04-22 NOTE — Progress Notes (Signed)
Patient ID: Scott Reynolds, male   DOB: 1940-12-26, 77 y.o.   MRN: [id number]   Reason for Appointment:   follow-up   History of Present Illness   He has had diabetes since 2001 which is usually well controlled.  Previously was taking glipizide and Actoplusmet but since about 2013    With the last few years he has been able to get control with metformin 2000 mg only. He is taking this regularly  Recent history, management and blood sugars:  A1c is again in the normal range and probably lower than his actual blood sugars.  On his last visit he had been taking weight but he thinks that he has been cutting back on portions and weight is improved  He still has been getting one meal from [employer].  He is otherwise having a small breakfast and evening meal is mostly a snack  He does not like to check his blood sugar at home despite reminders and again has has no readings to report   Food preferences: low fat. Small portions  Physical activity: exercise: Minimal yard work at times   Complications: bilateral leg amputation  Wt Readings from Last 3 Encounters:  04/22/18 210 lb 6.4 oz (95.4 kg)  08/02/17 215 lb (97.5 kg)  01/23/17 202 lb 6.4 oz (91.8 kg)   Lab Results  Component Value Date   HGBA1C 5.1 04/22/2018   HGBA1C 5.3 08/02/2017   HGBA1C 4.9 01/23/2017   Lab Results  Component Value Date   MICROALBUR 0.9 01/23/2017   LDLCALC 52 08/02/2017   CREATININE 0.96 08/02/2017     HYPERLIPIDEMIA:    Has a history of increased LDL; he has good control with Zocor 20 mg daily   Lab Results  Component Value Date   CHOL 107 08/02/2017   HDL 46.30 08/02/2017   LDLCALC 52 08/02/2017   TRIG 46.0 08/02/2017   CHOLHDL 2 08/02/2017   HYPERTENSION: See review of systems     Allergies as of 04/22/2018   No Known Allergies     Medication List        Accurate as of 04/22/18  1:10 PM. Always use your most recent med list.          aspirin 81 MG  tablet Take 81 mg by mouth daily.   bimatoprost 0.01 % Soln Commonly known as:  LUMIGAN 1 drop at bedtime.   brimonidine 0.15 % ophthalmic solution Commonly known as:  ALPHAGAN   COMBIGAN 0.2-0.5 % ophthalmic solution Generic drug:  brimonidine-timolol   dorzolamide 2 % ophthalmic solution Commonly known as:  TRUSOPT   glucose blood test strip Use as instructed to check blood sugar 3 times per week dx code E11.9   latanoprost 0.005 % ophthalmic solution Commonly known as:  XALATAN   losartan 25 MG tablet Commonly known as:  COZAAR TAKE 1 TABLET BY MOUTH TWICE A DAY   metFORMIN 1000 MG tablet Commonly known as:  GLUCOPHAGE TAKE 1 TABLET BY MOUTH TWICE A DAY   prednisoLONE acetate 1 % ophthalmic suspension Commonly known as:  PRED FORTE   simvastatin 20 MG tablet Commonly known as:  ZOCOR TAKE 1 TABLET BY MOUTH EVERYDAY AT BEDTIME   timolol 0.5 % ophthalmic solution Commonly known as:  TIMOPTIC       Allergies: No Known Allergies  Past Medical History:  Diagnosis Date  . Diabetes mellitus without complication (HCC)   . Erectile dysfunction   . Glaucoma   . Hyperlipidemia   .  Hypertension     Past Surgical History:  Procedure Laterality Date  . LEG AMPUTATION ABOVE KNEE Bilateral    2001 left leg and 2003 right leg    Family History  Problem Relation Age of Onset  . Diabetes Brother   . Cancer Neg Hx     Social History:  reports that he has never smoked. He has never used smokeless tobacco. His alcohol and drug histories are not on file.   ROS  He has regular eye exams ; is followed at least twice a year for his glaucoma No recent reports available   Hypertension:    His blood pressure has been controlled with losartan 50 mg Blood pressure was higher on first 2 measurements patient says he was rushing to get here  BP Readings from Last 3 Encounters:  04/22/18 130/68  08/02/17 140/78  01/23/17 (!) 146/86    He has not had a PCP  established , was advised to do so on his last several visits     Examination:   BP 130/68 (BP Location: Left Arm, Patient Position: Sitting, Cuff Size: Normal)   Pulse 87   Ht 5\' 10"  (1.778 m)   Wt 210 lb 6.4 oz (95.4 kg)   SpO2 97%   BMI 30.19 kg/m   Body mass index is 30.19 kg/m.   Physical Exam    Assesment:   Diabetes type 2 with mild obesity  His diabetes has been consistently well controlled on metformin 1 g twice a day time  A1c is again in the normal range He has not monitored at home and discussed that since he is not seen regularly he needs to monitor some at home So far has not had any progression of his diabetes  He has lost some of the weight that he had gained before  For now we will continue on metformin alone Need to recheck labs including renal function today  HYPERTENSION: Blood pressure is controlled with 50 mg losartan He tends to have mild whitecoat syndrome also  LIPIDS controlled with simvastatin, needs follow-up labs today  Given him a list of contact numbers for PCP to be established for general care and preventive exams which he has not had  Also need to have a report of his eye exam High-dose influenza vaccine given    Reather LittlerAjay Craige Patel 04/22/2018, 1:10 PM

## 2018-04-22 NOTE — Addendum Note (Signed)
Addended by: Adline MangoSTONE-ELMORE, Avi Kerschner I on: 04/22/2018 05:07 PM   Modules accepted: Orders

## 2018-04-22 NOTE — Addendum Note (Signed)
Addended by: Adline MangoSTONE-ELMORE, Abia Monaco I on: 04/22/2018 05:06 PM   Modules accepted: Orders

## 2018-05-09 DIAGNOSIS — H401113 Primary open-angle glaucoma, right eye, severe stage: Secondary | ICD-10-CM | POA: Diagnosis not present

## 2018-05-09 DIAGNOSIS — H35371 Puckering of macula, right eye: Secondary | ICD-10-CM | POA: Diagnosis not present

## 2018-05-09 DIAGNOSIS — H35031 Hypertensive retinopathy, right eye: Secondary | ICD-10-CM | POA: Diagnosis not present

## 2018-05-09 DIAGNOSIS — E119 Type 2 diabetes mellitus without complications: Secondary | ICD-10-CM | POA: Diagnosis not present

## 2018-05-09 LAB — HM DIABETES EYE EXAM

## 2018-06-28 ENCOUNTER — Other Ambulatory Visit: Payer: Self-pay | Admitting: Endocrinology

## 2018-08-07 ENCOUNTER — Other Ambulatory Visit: Payer: Self-pay

## 2018-08-07 MED ORDER — LOSARTAN POTASSIUM 25 MG PO TABS: 25.0000 mg | ORAL_TABLET | Freq: Two times a day (BID) | ORAL | 0 refills | Status: DC

## 2018-08-13 ENCOUNTER — Other Ambulatory Visit: Payer: Self-pay

## 2018-08-13 MED ORDER — METFORMIN HCL 1000 MG PO TABS: 1000.0000 mg | ORAL_TABLET | Freq: Two times a day (BID) | ORAL | 1 refills | Status: DC

## 2018-08-15 ENCOUNTER — Other Ambulatory Visit: Payer: Self-pay

## 2018-08-15 MED ORDER — METFORMIN HCL 1000 MG PO TABS: 1000.0000 mg | ORAL_TABLET | Freq: Two times a day (BID) | ORAL | 1 refills | Status: DC

## 2018-08-15 MED ORDER — SIMVASTATIN 20 MG PO TABS: ORAL_TABLET | ORAL | 1 refills | Status: DC

## 2018-08-23 ENCOUNTER — Encounter: Payer: PPO | Admitting: Endocrinology

## 2018-08-23 ENCOUNTER — Encounter: Payer: Self-pay | Admitting: Endocrinology

## 2018-08-24 NOTE — Progress Notes (Signed)
This encounter was created in error - please disregard.

## 2018-09-22 ENCOUNTER — Other Ambulatory Visit: Payer: Self-pay | Admitting: Endocrinology

## 2018-10-25 ENCOUNTER — Ambulatory Visit: Payer: Medicare Other | Admitting: Endocrinology

## 2018-11-26 ENCOUNTER — Ambulatory Visit: Payer: Medicare Other | Admitting: Endocrinology

## 2018-12-23 ENCOUNTER — Other Ambulatory Visit: Payer: Self-pay

## 2018-12-23 ENCOUNTER — Ambulatory Visit: Payer: Medicare Other | Admitting: Endocrinology

## 2018-12-23 MED ORDER — LOSARTAN POTASSIUM 25 MG PO TABS: 25.0000 mg | ORAL_TABLET | Freq: Two times a day (BID) | ORAL | 0 refills | Status: DC

## 2018-12-25 ENCOUNTER — Other Ambulatory Visit: Payer: Self-pay | Admitting: Endocrinology

## 2018-12-28 ENCOUNTER — Other Ambulatory Visit: Payer: Self-pay | Admitting: Endocrinology

## 2019-01-20 ENCOUNTER — Encounter: Payer: Self-pay | Admitting: Endocrinology

## 2019-01-20 ENCOUNTER — Other Ambulatory Visit: Payer: Self-pay

## 2019-01-20 ENCOUNTER — Ambulatory Visit: Payer: Medicare Other | Admitting: Endocrinology

## 2019-01-20 VITALS — BP 142/74 | HR 93 | Ht 70.0 in | Wt 210.0 lb

## 2019-01-20 DIAGNOSIS — I10 Essential (primary) hypertension: Secondary | ICD-10-CM

## 2019-01-20 DIAGNOSIS — D649 Anemia, unspecified: Secondary | ICD-10-CM

## 2019-01-20 DIAGNOSIS — E7800 Pure hypercholesterolemia, unspecified: Secondary | ICD-10-CM | POA: Diagnosis not present

## 2019-01-20 DIAGNOSIS — E119 Type 2 diabetes mellitus without complications: Secondary | ICD-10-CM | POA: Diagnosis not present

## 2019-01-20 DIAGNOSIS — Z23 Encounter for immunization: Secondary | ICD-10-CM

## 2019-01-20 DIAGNOSIS — Z89611 Acquired absence of right leg above knee: Secondary | ICD-10-CM

## 2019-01-20 DIAGNOSIS — Z89612 Acquired absence of left leg above knee: Secondary | ICD-10-CM

## 2019-01-20 LAB — URINALYSIS, ROUTINE W REFLEX MICROSCOPIC
Bilirubin Urine: NEGATIVE
Hgb urine dipstick: NEGATIVE
Ketones, ur: NEGATIVE
Leukocytes,Ua: NEGATIVE
Nitrite: NEGATIVE
Specific Gravity, Urine: 1.02 (ref 1.000–1.030)
Total Protein, Urine: NEGATIVE
Urine Glucose: NEGATIVE
Urobilinogen, UA: 4 — AB (ref 0.0–1.0)
pH: 6.5 (ref 5.0–8.0)

## 2019-01-20 LAB — COMPREHENSIVE METABOLIC PANEL
ALT: 7 U/L (ref 0–53)
AST: 13 U/L (ref 0–37)
Albumin: 4.3 g/dL (ref 3.5–5.2)
Alkaline Phosphatase: 57 U/L (ref 39–117)
BUN: 16 mg/dL (ref 6–23)
CO2: 31 mEq/L (ref 19–32)
Calcium: 9.5 mg/dL (ref 8.4–10.5)
Chloride: 106 mEq/L (ref 96–112)
Creatinine, Ser: 0.94 mg/dL (ref 0.40–1.50)
GFR: 93.67 mL/min (ref >60.00)
Glucose, Bld: 122 mg/dL — ABNORMAL HIGH (ref 70–99)
Potassium: 4.2 mEq/L (ref 3.5–5.1)
Sodium: 143 mEq/L (ref 135–145)
Total Bilirubin: 0.8 mg/dL (ref 0.2–1.2)
Total Protein: 7.3 g/dL (ref 6.0–8.3)

## 2019-01-20 LAB — LIPID PANEL
Cholesterol: 104 mg/dL (ref 0–200)
HDL: 45.8 mg/dL (ref >39.00)
LDL Cholesterol: 51 mg/dL (ref 0–99)
NonHDL: 57.72
Total CHOL/HDL Ratio: 2
Triglycerides: 33 mg/dL (ref 0.0–149.0)
VLDL: 6.6 mg/dL (ref 0.0–40.0)

## 2019-01-20 LAB — CBC WITH DIFFERENTIAL/PLATELET
Basophils Absolute: 0 10*3/uL (ref 0.0–0.1)
Basophils Relative: 0.5 % (ref 0.0–3.0)
Eosinophils Absolute: 0.1 10*3/uL (ref 0.0–0.7)
Eosinophils Relative: 1.1 % (ref 0.0–5.0)
HCT: 38.1 % — ABNORMAL LOW (ref 39.0–52.0)
Hemoglobin: 12.5 g/dL — ABNORMAL LOW (ref 13.0–17.0)
Lymphocytes Relative: 19.7 % (ref 12.0–46.0)
Lymphs Abs: 1.1 10*3/uL (ref 0.7–4.0)
MCHC: 32.9 g/dL (ref 30.0–36.0)
MCV: 92.3 fl (ref 78.0–100.0)
Monocytes Absolute: 0.4 10*3/uL (ref 0.1–1.0)
Monocytes Relative: 6.3 % (ref 3.0–12.0)
Neutro Abs: 4.2 10*3/uL (ref 1.4–7.7)
Neutrophils Relative %: 72.4 % (ref 43.0–77.0)
Platelets: 183 10*3/uL (ref 150.0–400.0)
RBC: 4.12 Mil/uL — ABNORMAL LOW (ref 4.22–5.81)
RDW: 12.8 % (ref 11.5–15.5)
WBC: 5.8 10*3/uL (ref 4.0–10.5)

## 2019-01-20 LAB — MICROALBUMIN / CREATININE URINE RATIO
Creatinine,U: 178 mg/dL
Microalb Creat Ratio: 2.2 mg/g (ref 0.0–30.0)
Microalb, Ur: 4 mg/dL — ABNORMAL HIGH (ref 0.0–1.9)

## 2019-01-20 LAB — POCT GLYCOSYLATED HEMOGLOBIN (HGB A1C): Hemoglobin A1C: 5.3 % (ref 4.0–5.6)

## 2019-01-20 LAB — GLUCOSE, POCT (MANUAL RESULT ENTRY): POC Glucose: 150 mg/dl — AB (ref 70–99)

## 2019-01-20 MED ORDER — GLUCOSE BLOOD VI STRP: 1.0000 | ORAL_STRIP | Freq: Every day | 1 refills | Status: DC

## 2019-01-20 NOTE — Progress Notes (Signed)
Patient ID: Scott Reynolds, male   DOB: 11-04-1940, 78 y.o.   MRN: [id number]   Reason for Appointment:   follow-up   History of Present Illness   He has had diabetes since 2001 which is usually well controlled.  Previously was taking glipizide and Actoplusmet but since about 2013    In the last few years he has been able to get consistent control with metformin 2000 mg only.   Recent history, management and blood sugars:  A1c is again in the normal range at 5.3 and lower than expected  Today's blood sugar was 150 fasting in the office but he says he overate yesterday on his birthday  He is currently only active with housework and taking care of his wife but not doing any formal exercise  His weight has been consistent  He is usually trying to eat low-fat meals and not a lot of large portions, avoiding fast food  Occasionally in the morning at breakfast he will only have grits but otherwise may have eggs and toast   He does not have any meter at home to check his sugars currently  No side effects from metformin and he takes this regularly twice a day    Physical activity: exercise: yard work at times   Complications: bilateral leg amputation  Wt Readings from Last 3 Encounters:  01/20/19 210 lb (95.3 kg)  04/22/18 210 lb 6.4 oz (95.4 kg)  08/02/17 215 lb (97.5 kg)   Lab Results  Component Value Date   HGBA1C 5.3 01/20/2019   HGBA1C 5.1 04/22/2018   HGBA1C 5.3 08/02/2017   Lab Results  Component Value Date   MICROALBUR 0.9 01/23/2017   [place] 70 04/22/2018   CREATININE 0.91 04/22/2018     HYPERLIPIDEMIA:    Has a history of increased LDL; he has good control with Zocor 20 mg daily   Lab Results  Component Value Date   CHOL 134 04/22/2018   HDL 53.80 04/22/2018   LDLCALC 70 04/22/2018   TRIG 52.0 04/22/2018   CHOLHDL 2 04/22/2018   HYPERTENSION: See review of systems     Allergies as of 01/20/2019   No Known Allergies     Medication  List       Accurate as of January 20, 2019  2:54 PM. If you have any questions, ask your nurse or doctor.        aspirin 81 MG tablet Take 81 mg by mouth daily.   bimatoprost 0.01 % Soln Commonly known as: LUMIGAN 1 drop at bedtime.   brimonidine 0.15 % ophthalmic solution Commonly known as: ALPHAGAN   Combigan 0.2-0.5 % ophthalmic solution Generic drug: brimonidine-timolol   dorzolamide 2 % ophthalmic solution Commonly known as: TRUSOPT   glucose blood test strip 1 each by Other route daily. Use OneTouch Verio test strips as instructed to check blood sugar once daily. What changed: Another medication with the same name was removed. Continue taking this medication, and follow the directions you see here. Changed by: Jayme Cloud, LPN   latanoprost 2.229 % ophthalmic solution Commonly known as: XALATAN   losartan 25 MG tablet Commonly known as: COZAAR TAKE 1 TABLET BY MOUTH TWICE A DAY   metFORMIN 1000 MG tablet Commonly known as: GLUCOPHAGE TAKE 1 TABLET BY MOUTH TWICE A DAY   OneTouch Verio Flex System w/Device Kit 1 each by Does not apply route daily. Use OneTouch Verio Flex meter to check blood sugar once daily.   prednisoLONE acetate  1 % ophthalmic suspension Commonly known as: PRED FORTE   simvastatin 20 MG tablet Commonly known as: ZOCOR TAKE 1 TABLET BY MOUTH EVERYDAY AT BEDTIME   timolol 0.5 % ophthalmic solution Commonly known as: TIMOPTIC       Allergies: No Known Allergies  Past Medical History:  Diagnosis Date  . Diabetes mellitus without complication ([facility])   . Erectile dysfunction   . Glaucoma   . Hyperlipidemia   . Hypertension     Past Surgical History:  Procedure Laterality Date  . LEG AMPUTATION ABOVE KNEE Bilateral    2001 left leg and 2003 right leg    Family History  Problem Relation Age of Onset  . Diabetes Brother   . Cancer Neg Hx     Social History:  reports that he has never smoked. He has never used smokeless  tobacco. No history on file for alcohol and drug.   ROS  He has regular eye exams ; is followed at least twice a year for his glaucoma No recent reports available   Hypertension:    His blood pressure has been controlled with losartan 50 mg in divided doses Blood pressure was higher on first measurement again today likely from anxiety   BP Readings from Last 3 Encounters:  01/20/19 (!) 142/74  04/22/18 130/68  08/02/17 140/78   Lab Results  Component Value Date   CREATININE 0.91 04/22/2018   CREATININE 0.96 08/02/2017   CREATININE 0.99 01/23/2017    He still has not had a PCP established , was advised to do so on his last several visits and has been given a list of available PCPs     Examination:   BP (!) 142/74   Pulse 93   Ht _0  (1.778 m)   Wt 210 lb (95.3 kg)   SpO2 98%   BMI 30.13 kg/m   Body mass index is 30.13 kg/m.   Physical Exam    Assesment:   Diabetes type 2 with mild obesity  His diabetes has been consistently well controlled on metformin 1 g twice a day long-term  A1c is again in the normal range and likely falsely low  He has not monitored at home He is open to starting glucose monitoring and new One Touch meter given Although he has been generally trying to watch his diet his blood sugar is higher today likely from going off his diet yesterday He is not doing much formal exercise but weight is stable   HYPERTENSION: Blood pressure is controlled with 50 mg losartan He tends to have mild whitecoat syndrome likely  LIPIDS previously controlled with simvastatin, needs follow-up labs today  Again encouraged him to establish with a PCP  Will request report of his eye exam  High-dose influenza vaccine given    Elayne Snare 01/20/2019, 2:54 PM

## 2019-01-21 ENCOUNTER — Other Ambulatory Visit: Payer: Medicare Other

## 2019-01-21 ENCOUNTER — Other Ambulatory Visit: Payer: Self-pay | Admitting: Endocrinology

## 2019-01-21 DIAGNOSIS — D649 Anemia, unspecified: Secondary | ICD-10-CM

## 2019-01-21 LAB — FRUCTOSAMINE: Fructosamine: 306 umol/L — ABNORMAL HIGH (ref 0–285)

## 2019-01-21 LAB — IBC PANEL
Iron: 80 ug/dL (ref 42–165)
Saturation Ratios: 25.4 % (ref 20.0–50.0)
Transferrin: 225 mg/dL (ref 212.0–360.0)

## 2019-01-21 LAB — VITAMIN B12: Vitamin B-12: 142 pg/mL — ABNORMAL LOW (ref 211–911)

## 2019-01-21 NOTE — Addendum Note (Signed)
Addended by: Kaylyn Lim I on: 01/21/2019 01:42 PM   Modules accepted: Orders

## 2019-01-25 ENCOUNTER — Other Ambulatory Visit: Payer: Self-pay | Admitting: Endocrinology

## 2019-03-19 ENCOUNTER — Other Ambulatory Visit: Payer: Self-pay

## 2019-03-19 MED ORDER — SIMVASTATIN 20 MG PO TABS: ORAL_TABLET | ORAL | 1 refills | Status: DC

## 2019-03-26 ENCOUNTER — Other Ambulatory Visit: Payer: Self-pay

## 2019-03-26 MED ORDER — SIMVASTATIN 20 MG PO TABS: ORAL_TABLET | ORAL | 1 refills | Status: DC

## 2019-07-17 ENCOUNTER — Ambulatory Visit: Payer: Medicare Other | Admitting: Endocrinology

## 2019-07-24 ENCOUNTER — Ambulatory Visit: Payer: Medicare HMO

## 2019-07-28 ENCOUNTER — Ambulatory Visit: Payer: Medicare HMO | Attending: Internal Medicine

## 2019-07-28 DIAGNOSIS — Z23 Encounter for immunization: Secondary | ICD-10-CM

## 2019-07-28 NOTE — Progress Notes (Signed)
   Covid-19 Vaccination Clinic  Name:  Scott Reynolds    MRN: [id number] DOB: 20-Dec-1940  07/28/2019  [name] was observed post Covid-19 immunization for 15 minutes without incident. He was provided with Vaccine Information Sheet and instruction to access the V-Safe system.   [name] was instructed to call 911 with any severe reactions post vaccine: Marland Kitchen Difficulty breathing  . Swelling of face and throat  . A fast heartbeat  . A bad rash all over body  . Dizziness and weakness   Immunizations Administered    Name Date Dose VIS Date Route   Pfizer COVID-19 Vaccine 07/28/2019 11:08 AM 0.3 mL 05/02/2019 Intramuscular   Manufacturer: [employer], [employer]   Lot: [id number]   NDC: [address]-3

## 2019-08-12 ENCOUNTER — Other Ambulatory Visit: Payer: Self-pay

## 2019-08-12 MED ORDER — SIMVASTATIN 20 MG PO TABS: ORAL_TABLET | ORAL | 1 refills | Status: DC

## 2019-08-25 ENCOUNTER — Other Ambulatory Visit: Payer: Self-pay

## 2019-08-27 ENCOUNTER — Ambulatory Visit: Payer: Medicare HMO | Attending: Internal Medicine

## 2019-08-27 ENCOUNTER — Encounter: Payer: Self-pay | Admitting: Endocrinology

## 2019-08-27 ENCOUNTER — Other Ambulatory Visit: Payer: Self-pay

## 2019-08-27 ENCOUNTER — Ambulatory Visit: Payer: Medicare HMO | Admitting: Endocrinology

## 2019-08-27 VITALS — BP 144/72 | HR 98 | Ht 70.0 in | Wt 225.2 lb

## 2019-08-27 DIAGNOSIS — E119 Type 2 diabetes mellitus without complications: Secondary | ICD-10-CM | POA: Diagnosis not present

## 2019-08-27 DIAGNOSIS — E7800 Pure hypercholesterolemia, unspecified: Secondary | ICD-10-CM

## 2019-08-27 DIAGNOSIS — I10 Essential (primary) hypertension: Secondary | ICD-10-CM | POA: Diagnosis not present

## 2019-08-27 DIAGNOSIS — E538 Deficiency of other specified B group vitamins: Secondary | ICD-10-CM

## 2019-08-27 DIAGNOSIS — D649 Anemia, unspecified: Secondary | ICD-10-CM | POA: Diagnosis not present

## 2019-08-27 DIAGNOSIS — Z23 Encounter for immunization: Secondary | ICD-10-CM

## 2019-08-27 LAB — LIPID PANEL
Cholesterol: 115 mg/dL (ref 0–200)
HDL: 40.6 mg/dL (ref >39.00)
LDL Cholesterol: 59 mg/dL (ref 0–99)
NonHDL: 74.85
Total CHOL/HDL Ratio: 3
Triglycerides: 81 mg/dL (ref 0.0–149.0)
VLDL: 16.2 mg/dL (ref 0.0–40.0)

## 2019-08-27 LAB — COMPREHENSIVE METABOLIC PANEL
ALT: 9 U/L (ref 0–53)
AST: 12 U/L (ref 0–37)
Albumin: 4.4 g/dL (ref 3.5–5.2)
Alkaline Phosphatase: 74 U/L (ref 39–117)
BUN: 20 mg/dL (ref 6–23)
CO2: 31 mEq/L (ref 19–32)
Calcium: 9.8 mg/dL (ref 8.4–10.5)
Chloride: 104 mEq/L (ref 96–112)
Creatinine, Ser: 1.01 mg/dL (ref 0.40–1.50)
GFR: 86.09 mL/min (ref >60.00)
Glucose, Bld: 164 mg/dL — ABNORMAL HIGH (ref 70–99)
Potassium: 4.4 mEq/L (ref 3.5–5.1)
Sodium: 140 mEq/L (ref 135–145)
Total Bilirubin: 1.1 mg/dL (ref 0.2–1.2)
Total Protein: 7.4 g/dL (ref 6.0–8.3)

## 2019-08-27 LAB — CBC WITH DIFFERENTIAL/PLATELET
Basophils Absolute: 0 10*3/uL (ref 0.0–0.1)
Basophils Relative: 0.6 % (ref 0.0–3.0)
Eosinophils Absolute: 0.1 10*3/uL (ref 0.0–0.7)
Eosinophils Relative: 1 % (ref 0.0–5.0)
HCT: 37.1 % — ABNORMAL LOW (ref 39.0–52.0)
Hemoglobin: 12.4 g/dL — ABNORMAL LOW (ref 13.0–17.0)
Lymphocytes Relative: 18.3 % (ref 12.0–46.0)
Lymphs Abs: 1.2 10*3/uL (ref 0.7–4.0)
MCHC: 33.5 g/dL (ref 30.0–36.0)
MCV: 92.2 fl (ref 78.0–100.0)
Monocytes Absolute: 0.4 10*3/uL (ref 0.1–1.0)
Monocytes Relative: 6.6 % (ref 3.0–12.0)
Neutro Abs: 4.6 10*3/uL (ref 1.4–7.7)
Neutrophils Relative %: 73.5 % (ref 43.0–77.0)
Platelets: 178 10*3/uL (ref 150.0–400.0)
RBC: 4.02 Mil/uL — ABNORMAL LOW (ref 4.22–5.81)
RDW: 12.8 % (ref 11.5–15.5)
WBC: 6.3 10*3/uL (ref 4.0–10.5)

## 2019-08-27 LAB — VITAMIN B12: Vitamin B-12: 1224 pg/mL — ABNORMAL HIGH (ref 211–911)

## 2019-08-27 LAB — POCT GLYCOSYLATED HEMOGLOBIN (HGB A1C): Hemoglobin A1C: 7.6 % — AB (ref 4.0–5.6)

## 2019-08-27 LAB — GLUCOSE, POCT (MANUAL RESULT ENTRY): POC Glucose: 157 mg/dl — AB (ref 70–99)

## 2019-08-27 MED ORDER — GLIPIZIDE ER 2.5 MG PO TB24: 2.5000 mg | ORAL_TABLET | Freq: Every day | ORAL | 1 refills | Status: DC

## 2019-08-27 NOTE — Progress Notes (Signed)
   Covid-19 Vaccination Clinic  Name:  Juana Montini    MRN: [id number] DOB: 03-26-1941  08/27/2019  Mr. Sheahan was observed post Covid-19 immunization for 15 minutes without incident. He was provided with Vaccine Information Sheet and instruction to access the V-Safe system.   Mr. Lento was instructed to call 911 with any severe reactions post vaccine: Marland Kitchen Difficulty breathing  . Swelling of face and throat  . A fast heartbeat  . A bad rash all over body  . Dizziness and weakness   Immunizations Administered    Name Date Dose VIS Date Route   Pfizer COVID-19 Vaccine 08/27/2019 11:43 AM 0.3 mL 05/02/2019 Intramuscular   Manufacturer: [employer], [employer]   Lot: [id number]   NDC: [address]-9

## 2019-08-27 NOTE — Progress Notes (Signed)
Patient ID: Scott Reynolds, male   DOB: [birth year], 79 y.o.   MRN: [id number]   Reason for Appointment:   follow-up   History of Present Illness   DIABETES:  He has had diabetes since 2001 which is usually well controlled.  Previously was taking glipizide and Actoplusmet but since about 2013    In the last few years he has been able to get consistent control with metformin 2000 mg only.  Last visit was in 8/20  Recent history, management and blood sugars:  A1c is previously in the normal range  However it is now higher at 7.1, previously was at 5.3 and lower than expected  As before he does not have a meter at home and not checking his blood sugar  Because of his wife's terminal illness in the last few weeks he has not paid attention to his diet  He thinks he is eating out more and not watching carbohydrates and high-fat meals at times  He has gained significant amount of weight, about 15 pounds since last visit  Fasting glucose 157 in the office  Has not missed any doses of his Metformin    Physical activity: exercise: On and off   Complications: bilateral leg amputation  Wt Readings from Last 3 Encounters:  08/27/19 225 lb 3.2 oz (102.2 kg)  01/20/19 210 lb (95.3 kg)  04/22/18 210 lb 6.4 oz (95.4 kg)   Lab Results  Component Value Date   HGBA1C 7.6 (A) 08/27/2019   HGBA1C 5.3 01/20/2019   HGBA1C 5.1 04/22/2018   Lab Results  Component Value Date   MICROALBUR 4.0 (H) 01/20/2019   LDLCALC 51 01/20/2019   CREATININE 0.94 01/20/2019     HYPERLIPIDEMIA:    Has a history of increased LDL; he has good control with Zocor 20 mg He thinks he has been taking this regularly recently   Lab Results  Component Value Date   CHOL 104 01/20/2019   HDL 45.80 01/20/2019   LDLCALC 51 01/20/2019   TRIG 33.0 01/20/2019   CHOLHDL 2 01/20/2019   HYPERTENSION: See review of systems     Allergies as of 08/27/2019   No Known Allergies     Medication List       Accurate as of August 27, 2019  1:08 PM. If you have any questions, ask your nurse or doctor.        aspirin 81 MG tablet Take 81 mg by mouth daily.   bimatoprost 0.01 % Soln Commonly known as: LUMIGAN 1 drop at bedtime.   brimonidine 0.15 % ophthalmic solution Commonly known as: ALPHAGAN   Combigan 0.2-0.5 % ophthalmic solution Generic drug: brimonidine-timolol   dorzolamide 2 % ophthalmic solution Commonly known as: TRUSOPT   glipiZIDE 2.5 MG 24 hr tablet Commonly known as: GLUCOTROL XL Take 1 tablet (2.5 mg total) by mouth daily with breakfast. Started by: Elayne Snare, MD   glucose blood test strip 1 each by Other route daily. Use OneTouch Verio test strips as instructed to check blood sugar once daily.   latanoprost 0.005 % ophthalmic solution Commonly known as: XALATAN   losartan 25 MG tablet Commonly known as: COZAAR TAKE 1 TABLET BY MOUTH  TWICE DAILY   metFORMIN 1000 MG tablet Commonly known as: GLUCOPHAGE TAKE 1 TABLET BY MOUTH TWICE A DAY   OneTouch Verio Flex System w/Device Kit 1 each by Does not apply route daily. Use OneTouch Verio Flex meter to check blood sugar once daily.   prednisoLONE  acetate 1 % ophthalmic suspension Commonly known as: PRED FORTE   simvastatin 20 MG tablet Commonly known as: ZOCOR TAKE 1 TABLET BY MOUTH EVERYDAY AT BEDTIME   timolol 0.5 % ophthalmic solution Commonly known as: TIMOPTIC       Allergies: No Known Allergies  Past Medical History:  Diagnosis Date  . Diabetes mellitus without complication ([place])   . Erectile dysfunction   . Glaucoma   . Hyperlipidemia   . Hypertension     Past Surgical History:  Procedure Laterality Date  . LEG AMPUTATION ABOVE KNEE Bilateral    2001 left leg and 2003 right leg    Family History  Problem Relation Age of Onset  . Diabetes Brother   . Cancer Neg Hx     Social History:  reports that he has never smoked. He has never used smokeless tobacco. No history on file  for alcohol and drug.   ROS  He has regular eye exams; is followed at least twice a year for his glaucoma No recent reports available   Hypertension:    His blood pressure has been treated with losartan 50 mg in divided doses Blood pressure was higher on first measurement again today likely from whitecoat syndrome However he thinks he has been eating out and getting more high sodium foods lately  BP Readings from Last 3 Encounters:  08/27/19 (!) 144/72  01/20/19 (!) 142/74  04/22/18 130/68   Lab Results  Component Value Date   CREATININE 0.94 01/20/2019   CREATININE 0.91 04/22/2018   CREATININE 0.96 08/02/2017    He was found to have a low B12 and he is taking 1000 mcg daily as recommended  Lab Results  Component Value Date   VITAMINB12 142 (L) 01/21/2019    He still has not had a PCP established , was advised to do so several times and has been given a list of available PCPs  He has had his first Covid vaccine   Examination:   BP (!) 144/72   Pulse 98   Ht '5\' 10"'  (1.778 m)   Wt 225 lb 3.2 oz (102.2 kg)   SpO2 98%   BMI 32.31 kg/m   Body mass index is 32.31 kg/m.   Physical Exam   Initial blood pressure was 160/70 Repeat pulse 84  Assesment:   Diabetes type 2 with mild obesity  His diabetes has been the well controlled on metformin 1 g twice a day long-term  A1c is higher than usual at 7.1 This is likely to be from poor dietary habits, weight gain and stress since his last visit Usually A1c appears to be falsely low  He has not monitored blood sugars at home even though he has been given a new meter on several occasions Also not doing much exercise lately  He thinks he can start changing his diet again but in the meantime will start him on glipizide ER 2.5 mg daily, has taken glipizide previously Follow-up in 2 months  HYPERTENSION: Blood pressure is relatively well controlled with 50 mg losartan He tends to have mild whitecoat syndrome and blood  pressure was improving on the second measurement  LIPIDS previously controlled with simvastatin, needs follow-up labs today  B12 deficiency with borderline anemia: This has been supplemented and will verify that he is getting enough with repeat B12 and CBC levels   Also we will need to look at establishing with a PCP for general care   Elayne Snare 08/27/2019, 1:08 PM

## 2019-09-19 ENCOUNTER — Other Ambulatory Visit: Payer: Self-pay | Admitting: Endocrinology

## 2019-10-16 ENCOUNTER — Other Ambulatory Visit: Payer: Self-pay | Admitting: Endocrinology

## 2019-10-24 ENCOUNTER — Other Ambulatory Visit: Payer: Self-pay

## 2019-10-28 ENCOUNTER — Other Ambulatory Visit: Payer: Self-pay

## 2019-10-28 ENCOUNTER — Ambulatory Visit: Payer: Medicare HMO | Admitting: Endocrinology

## 2019-10-28 ENCOUNTER — Encounter: Payer: Self-pay | Admitting: Endocrinology

## 2019-10-28 VITALS — BP 140/78 | HR 82 | Ht 70.0 in | Wt 219.4 lb

## 2019-10-28 DIAGNOSIS — E7800 Pure hypercholesterolemia, unspecified: Secondary | ICD-10-CM

## 2019-10-28 DIAGNOSIS — E119 Type 2 diabetes mellitus without complications: Secondary | ICD-10-CM | POA: Diagnosis not present

## 2019-10-28 LAB — GLUCOSE, POCT (MANUAL RESULT ENTRY): POC Glucose: 124 mg/dl — AB (ref 70–99)

## 2019-10-28 NOTE — Progress Notes (Signed)
Patient ID: Scott Reynolds, male   DOB: Dec 12, 1940, 79 y.o.   MRN: [id number]   Reason for Appointment:   follow-up   History of Present Illness   DIABETES:  He has had diabetes since 2001 which is usually well controlled.  Previously was taking glipizide and Actoplusmet but since about 2013    In the last few years he was able to get consistent control with metformin 2000 mg only.  Oral hypoglycemic drugs used: Glipizide ER 2.5 mg daily, Metformin 1 g twice daily  Recent history, management and blood sugars:  A1c is 7.1 this year, usually normal  He thinks he had higher blood sugars on waking because of poor diet and eating out  Also not able to do much physical activity  He was given glipizide ER in addition to his Metformin  He has taken this regularly the last 2 months  Fasting glucose 157 in the office previously and now 124  Has not had any symptoms of low sugars with adding glipizide     Complications: bilateral leg amputation  Wt Readings from Last 3 Encounters:  10/28/19 219 lb 6.4 oz (99.5 kg)  08/27/19 225 lb 3.2 oz (102.2 kg)  01/20/19 210 lb (95.3 kg)   Lab Results  Component Value Date   HGBA1C 7.6 (A) 08/27/2019   HGBA1C 5.3 01/20/2019   HGBA1C 5.1 04/22/2018   Lab Results  Component Value Date   MICROALBUR 4.0 (H) 01/20/2019   LDLCALC 59 08/27/2019   CREATININE 1.01 08/27/2019     HYPERLIPIDEMIA:    Has a history of increased LDL; he has good control with Zocor 20 mg Labs:   Lab Results  Component Value Date   CHOL 115 08/27/2019   HDL 40.60 08/27/2019   LDLCALC 59 08/27/2019   TRIG 81.0 08/27/2019   CHOLHDL 3 08/27/2019   HYPERTENSION: See review of systems    Allergies as of 10/28/2019   No Known Allergies     Medication List       Accurate as of October 28, 2019  8:38 AM. If you have any questions, ask your nurse or doctor.        aspirin 81 MG tablet Take 81 mg by mouth daily.   bimatoprost 0.01 %  Soln Commonly known as: LUMIGAN 1 drop at bedtime.   brimonidine 0.15 % ophthalmic solution Commonly known as: ALPHAGAN   Combigan 0.2-0.5 % ophthalmic solution Generic drug: brimonidine-timolol   dorzolamide 2 % ophthalmic solution Commonly known as: TRUSOPT   glipiZIDE 2.5 MG 24 hr tablet Commonly known as: GLUCOTROL XL TAKE 1 TABLET (2.5 MG TOTAL) BY MOUTH DAILY WITH BREAKFAST.   glucose blood test strip 1 each by Other route daily. Use OneTouch Verio test strips as instructed to check blood sugar once daily.   latanoprost 0.005 % ophthalmic solution Commonly known as: XALATAN   losartan 25 MG tablet Commonly known as: COZAAR TAKE 1 TABLET BY MOUTH  TWICE DAILY   metFORMIN 1000 MG tablet Commonly known as: GLUCOPHAGE TAKE 1 TABLET BY MOUTH TWICE A DAY   OneTouch Verio Flex System w/Device Kit 1 each by Does not apply route daily. Use OneTouch Verio Flex meter to check blood sugar once daily.   prednisoLONE acetate 1 % ophthalmic suspension Commonly known as: PRED FORTE   simvastatin 20 MG tablet Commonly known as: ZOCOR TAKE 1 TABLET BY MOUTH EVERYDAY AT BEDTIME   timolol 0.5 % ophthalmic solution Commonly known as: TIMOPTIC  Allergies: No Known Allergies  Past Medical History:  Diagnosis Date   Diabetes mellitus without complication ([place])    Erectile dysfunction    Glaucoma    Hyperlipidemia    Hypertension     Past Surgical History:  Procedure Laterality Date   LEG AMPUTATION ABOVE KNEE Bilateral    2001 left leg and 2003 right leg    Family History  Problem Relation Age of Onset   Diabetes Brother    Cancer Neg Hx     Social History:  reports that he has never smoked. He has never used smokeless tobacco. No history on file for alcohol and drug.   ROS  He has regular eye exams; is followed at least twice a year for his glaucoma No recent reports available    Hypertension:    His blood pressure has been treated with  losartan 50 mg in divided doses Blood pressure was higher previously and appears to be improving Also previously likely had higher sodium intake and eating out   BP Readings from Last 3 Encounters:  10/28/19 140/78  08/27/19 (!) 144/72  01/20/19 (!) 142/74   Lab Results  Component Value Date   CREATININE 1.01 08/27/2019   CREATININE 0.94 01/20/2019   CREATININE 0.91 04/22/2018    He was found to have a low B12 and he is taking 1000 mcg daily as recommended  Lab Results  Component Value Date   VITAMINB12 1,224 (H) 08/27/2019    He still has not called to set up a new PCP, was advised to do so several times and has been given a list of available PCPs  He has had his Covid vaccine   Examination:   BP 140/78 (BP Location: Left Arm, Patient Position: Sitting, Cuff Size: Normal)    Pulse 82    Ht '5\' 10"'  (1.778 m)    Wt 219 lb 6.4 oz (99.5 kg)    SpO2 98%    BMI 31.48 kg/m   Body mass index is 31.48 kg/m.   Physical Exam      Assesment:   Diabetes type 2 with mild obesity  His diabetes has been previously well controlled on metformin 1 g twice a day long-term  A1c is higher than usual at 7.1 in April from weight gain and poor diet  With adding low-dose glipizide his fasting glucose is better Again not able to review home blood sugars because of lack of monitoring He thinks he is doing better with his diet and appears to be losing weight He also was encouraged to start being more active with walking or other activities that he can tolerate  Advised him to call if he is having any symptoms of low sugars otherwise stay on the same combination  HYPERTENSION: Blood pressure is relatively better today    Again given a list of local PCPs in the group for establishing as a new patient for general care   Elayne Snare 10/28/2019, 8:38 AM

## 2019-11-13 ENCOUNTER — Other Ambulatory Visit: Payer: Self-pay | Admitting: Endocrinology

## 2019-12-04 ENCOUNTER — Other Ambulatory Visit: Payer: Self-pay | Admitting: Endocrinology

## 2019-12-05 ENCOUNTER — Other Ambulatory Visit: Payer: Self-pay | Admitting: Endocrinology

## 2019-12-10 ENCOUNTER — Other Ambulatory Visit: Payer: Self-pay | Admitting: Endocrinology

## 2020-01-06 ENCOUNTER — Telehealth: Payer: Self-pay | Admitting: Endocrinology

## 2020-01-06 ENCOUNTER — Other Ambulatory Visit: Payer: Self-pay | Admitting: Endocrinology

## 2020-01-06 NOTE — Telephone Encounter (Signed)
Patient stated he contacted the pharmacy. Rx refilled.

## 2020-01-06 NOTE — Telephone Encounter (Signed)
Patient requests a RX for the medication he takes for cholesterol (Patient does not know the name of the medication) be sent to:  [id number] [place], [place] - [address] Phone:  [phone]  Fax:  [phone]

## 2020-01-14 ENCOUNTER — Other Ambulatory Visit: Payer: Self-pay | Admitting: *Deleted

## 2020-01-14 MED ORDER — LOSARTAN POTASSIUM 25 MG PO TABS: 25.0000 mg | ORAL_TABLET | Freq: Two times a day (BID) | ORAL | 0 refills | Status: DC

## 2020-01-14 MED ORDER — SIMVASTATIN 20 MG PO TABS: ORAL_TABLET | ORAL | 0 refills | Status: DC

## 2020-01-15 ENCOUNTER — Other Ambulatory Visit: Payer: Self-pay

## 2020-01-15 DIAGNOSIS — E119 Type 2 diabetes mellitus without complications: Secondary | ICD-10-CM

## 2020-01-15 MED ORDER — GLIPIZIDE ER 2.5 MG PO TB24: 2.5000 mg | ORAL_TABLET | Freq: Every day | ORAL | 0 refills | Status: DC

## 2020-01-15 MED ORDER — METFORMIN HCL 1000 MG PO TABS: 1000.0000 mg | ORAL_TABLET | Freq: Two times a day (BID) | ORAL | 0 refills | Status: DC

## 2020-01-16 ENCOUNTER — Telehealth: Payer: Self-pay | Admitting: *Deleted

## 2020-01-16 NOTE — Telephone Encounter (Signed)
Patient requests RX for Brimonidine 0.2 Eye Drops. Please advise.

## 2020-01-17 NOTE — Telephone Encounter (Signed)
This has to be prescribed by his ophthalmologist.  Also he needs to establish with a new PCP

## 2020-01-28 ENCOUNTER — Ambulatory Visit: Payer: Medicare HMO | Admitting: Endocrinology

## 2020-01-31 ENCOUNTER — Other Ambulatory Visit: Payer: Self-pay | Admitting: Endocrinology

## 2020-01-31 DIAGNOSIS — E119 Type 2 diabetes mellitus without complications: Secondary | ICD-10-CM

## 2020-02-16 ENCOUNTER — Encounter: Payer: Self-pay | Admitting: Endocrinology

## 2020-02-16 ENCOUNTER — Other Ambulatory Visit: Payer: Self-pay

## 2020-02-16 ENCOUNTER — Ambulatory Visit: Payer: Medicare HMO | Admitting: Endocrinology

## 2020-02-16 VITALS — BP 142/74 | HR 93 | Wt 216.4 lb

## 2020-02-16 DIAGNOSIS — E1169 Type 2 diabetes mellitus with other specified complication: Secondary | ICD-10-CM

## 2020-02-16 DIAGNOSIS — Z23 Encounter for immunization: Secondary | ICD-10-CM

## 2020-02-16 DIAGNOSIS — E119 Type 2 diabetes mellitus without complications: Secondary | ICD-10-CM

## 2020-02-16 DIAGNOSIS — Z89611 Acquired absence of right leg above knee: Secondary | ICD-10-CM

## 2020-02-16 DIAGNOSIS — I10 Essential (primary) hypertension: Secondary | ICD-10-CM

## 2020-02-16 DIAGNOSIS — E7800 Pure hypercholesterolemia, unspecified: Secondary | ICD-10-CM | POA: Diagnosis not present

## 2020-02-16 DIAGNOSIS — E669 Obesity, unspecified: Secondary | ICD-10-CM

## 2020-02-16 DIAGNOSIS — Z89612 Acquired absence of left leg above knee: Secondary | ICD-10-CM

## 2020-02-16 LAB — COMPREHENSIVE METABOLIC PANEL
ALT: 8 U/L (ref 0–53)
AST: 15 U/L (ref 0–37)
Albumin: 4.7 g/dL (ref 3.5–5.2)
Alkaline Phosphatase: 73 U/L (ref 39–117)
BUN: 20 mg/dL (ref 6–23)
CO2: 29 mEq/L (ref 19–32)
Calcium: 10 mg/dL (ref 8.4–10.5)
Chloride: 102 mEq/L (ref 96–112)
Creatinine, Ser: 1.07 mg/dL (ref 0.40–1.50)
GFR: 80.45 mL/min (ref >60.00)
Glucose, Bld: 108 mg/dL — ABNORMAL HIGH (ref 70–99)
Potassium: 4.2 mEq/L (ref 3.5–5.1)
Sodium: 141 mEq/L (ref 135–145)
Total Bilirubin: 1.2 mg/dL (ref 0.2–1.2)
Total Protein: 8.1 g/dL (ref 6.0–8.3)

## 2020-02-16 LAB — MICROALBUMIN / CREATININE URINE RATIO
Creatinine,U: 107.5 mg/dL
Microalb Creat Ratio: 1.9 mg/g (ref 0.0–30.0)
Microalb, Ur: 2.1 mg/dL — ABNORMAL HIGH (ref 0.0–1.9)

## 2020-02-16 LAB — LIPID PANEL
Cholesterol: 117 mg/dL (ref 0–200)
HDL: 41.4 mg/dL (ref >39.00)
LDL Cholesterol: 64 mg/dL (ref 0–99)
NonHDL: 75.19
Total CHOL/HDL Ratio: 3
Triglycerides: 54 mg/dL (ref 0.0–149.0)
VLDL: 10.8 mg/dL (ref 0.0–40.0)

## 2020-02-16 LAB — POCT GLYCOSYLATED HEMOGLOBIN (HGB A1C): Hemoglobin A1C: 5.1 % (ref 4.0–5.6)

## 2020-02-16 LAB — GLUCOSE, POCT (MANUAL RESULT ENTRY): POC Glucose: 109 mg/dl — AB (ref 70–99)

## 2020-02-16 NOTE — Addendum Note (Signed)
Addended by: Adline Mango I on: 02/16/2020 09:25 AM   Modules accepted: Orders

## 2020-02-16 NOTE — Progress Notes (Signed)
Patient ID: Scott Reynolds, male   DOB: 1940/08/14, 79 y.o.   MRN: [id number]   Reason for Appointment:   follow-up   History of Present Illness   DIABETES:  He has had diabetes since 2001 which is usually well controlled.  Previously was taking glipizide and Actoplusmet but since about 2013    In the last few years he was able to get consistent control with metformin 2000 mg only.  Oral hypoglycemic drugs used: Glipizide ER 2.5 mg daily, Metformin 1 g twice daily  Recent history, management and blood sugars:  A1c is back down to 5.1 compared to 7.6   He thinks he has been consistently following his low calorie diet, previously had not been planning his meals and causing his sugars to be higher  As before he does not like to check his blood sugars  Again not able to do much physical activity  However has maintained a slight weight loss  He has continued glipizide ER 2.5 mg in addition to his Metformin  With this he has not had any symptoms of low blood sugars  Fasting glucose 109 in the office today, further improved    Complications: bilateral leg amputation  Wt Readings from Last 3 Encounters:  02/16/20 216 lb 6.4 oz (98.2 kg)  10/28/19 219 lb 6.4 oz (99.5 kg)  08/27/19 225 lb 3.2 oz (102.2 kg)   Lab Results  Component Value Date   HGBA1C 5.1 02/16/2020   HGBA1C 7.6 (A) 08/27/2019   HGBA1C 5.3 01/20/2019   Lab Results  Component Value Date   MICROALBUR 4.0 (H) 01/20/2019   LDLCALC 59 08/27/2019   CREATININE 1.01 08/27/2019     HYPERLIPIDEMIA:    Has a history of increased LDL; he has had good control with simvastatin 20 mg Labs:   Lab Results  Component Value Date   CHOL 115 08/27/2019   HDL 40.60 08/27/2019   LDLCALC 59 08/27/2019   TRIG 81.0 08/27/2019   CHOLHDL 3 08/27/2019   HYPERTENSION: See review of systems    Allergies as of 02/16/2020   No Known Allergies     Medication List       Accurate as of February 16, 2020  8:58  AM. If you have any questions, ask your nurse or doctor.        aspirin 81 MG tablet Take 81 mg by mouth daily.   bimatoprost 0.01 % Soln Commonly known as: LUMIGAN 1 drop at bedtime.   brimonidine 0.15 % ophthalmic solution Commonly known as: ALPHAGAN   Combigan 0.2-0.5 % ophthalmic solution Generic drug: brimonidine-timolol   dorzolamide 2 % ophthalmic solution Commonly known as: TRUSOPT   glipiZIDE 2.5 MG 24 hr tablet Commonly known as: GLUCOTROL XL TAKE 1 TABLET (2.5 MG TOTAL) BY MOUTH DAILY WITH BREAKFAST.   glucose blood test strip 1 each by Other route daily. Use OneTouch Verio test strips as instructed to check blood sugar once daily.   latanoprost 0.005 % ophthalmic solution Commonly known as: XALATAN   losartan 25 MG tablet Commonly known as: COZAAR Take 1 tablet (25 mg total) by mouth 2 (two) times daily.   metFORMIN 1000 MG tablet Commonly known as: GLUCOPHAGE Take 1 tablet (1,000 mg total) by mouth 2 (two) times daily.   OneTouch Verio Flex System w/Device Kit 1 each by Does not apply route daily. Use OneTouch Verio Flex meter to check blood sugar once daily.   prednisoLONE acetate 1 % ophthalmic suspension Commonly known as:  PRED FORTE   simvastatin 20 MG tablet Commonly known as: ZOCOR TAKE 1 TABLET BY MOUTH EVERYDAY AT BEDTIME   timolol 0.5 % ophthalmic solution Commonly known as: TIMOPTIC       Allergies: No Known Allergies  Past Medical History:  Diagnosis Date  . Diabetes mellitus without complication ([place])   . Erectile dysfunction   . Glaucoma   . Hyperlipidemia   . Hypertension     Past Surgical History:  Procedure Laterality Date  . LEG AMPUTATION ABOVE KNEE Bilateral    2001 left leg and 2003 right leg    Family History  Problem Relation Age of Onset  . Diabetes Brother   . Cancer Neg Hx     Social History:  reports that he has never smoked. He has never used smokeless tobacco. No history on file for alcohol use and  drug use.   ROS  He has regular eye exams from Dr. Venetia Maxon He is followed at least twice a year for his glaucoma No recent reports available    Hypertension:    His blood pressure has been treated with losartan 50 mg in divided doses Blood pressure was higher on the first measurement Does not monitor at home   BP Readings from Last 3 Encounters:  02/16/20 (!) 142/74  10/28/19 140/78  08/27/19 (!) 144/72   Lab Results  Component Value Date   CREATININE 1.01 08/27/2019   CREATININE 0.94 01/20/2019   CREATININE 0.91 04/22/2018    He was found to have a low B12 and he is taking 1000 mcg daily as recommended  Lab Results  Component Value Date   VITAMINB12 1,224 (H) 08/27/2019    He still has not called to set up a new PCP, was advised to do so several times and has been given a list of available PCPs  He has had his Covid vaccine, 2 doses  Taking vitamin D supplements   Examination:   BP (!) 142/74   Pulse 93   Wt 216 lb 6.4 oz (98.2 kg)   SpO2 98%   BMI 31.05 kg/m   Body mass index is 31.05 kg/m.   Physical Exam      Assesment:   Diabetes type 2 with mild obesity  His diabetes has been previously well controlled on metformin 1 g twice a day long-term  A1c is back to normal at 5.1, previously 7.6  He is not taking glipizide in addition to his Metformin No hypoglycemic symptoms Again not able to review home blood sugars because of lack of home testing  He thinks he is doing well with his diet and continues to be losing weight He has been only inconsistent with his increased activity level and he says he has no time to do this now  Given him information on hypoglycemia but in the meantime he will continue his glipizide 2.5 along with Metformin as before  HYPERTENSION: Blood pressure is high normal but he is rushing in and blood pressure is improved after resting  Lipids: Needs rechecking  He has a list of local PCPs in the group for establishing  as a new patient for general care and reminded him to do this   High-dose influenza vaccine given  Recommended that he get the booster shot of Covid vaccine after 02/26/2020   Elayne Snare 02/16/2020, 8:58 AM

## 2020-02-17 NOTE — Progress Notes (Signed)
Please call to let patient know that the lab results are normal and no further action needed

## 2020-02-18 ENCOUNTER — Other Ambulatory Visit: Payer: Self-pay

## 2020-02-18 DIAGNOSIS — E119 Type 2 diabetes mellitus without complications: Secondary | ICD-10-CM

## 2020-02-18 MED ORDER — GLIPIZIDE ER 2.5 MG PO TB24: 2.5000 mg | ORAL_TABLET | Freq: Every day | ORAL | 1 refills | Status: DC

## 2020-03-04 ENCOUNTER — Other Ambulatory Visit: Payer: Self-pay | Admitting: Endocrinology

## 2020-04-05 ENCOUNTER — Ambulatory Visit: Payer: Medicare HMO | Attending: Internal Medicine

## 2020-04-05 DIAGNOSIS — Z23 Encounter for immunization: Secondary | ICD-10-CM

## 2020-04-05 NOTE — Progress Notes (Signed)
   Covid-19 Vaccination Clinic  Name:  Scott Reynolds    MRN: [id number] DOB: [birth year]  04/05/2020  Scott Reynolds was observed post Covid-19 immunization for 15 minutes without incident. He was provided with Vaccine Information Sheet and instruction to access the V-Safe system.   Scott Reynolds was instructed to call 911 with any severe reactions post vaccine: Marland Kitchen Difficulty breathing  . Swelling of face and throat  . A fast heartbeat  . A bad rash all over body  . Dizziness and weakness   Immunizations Administered    Name Date Dose VIS Date Route   Pfizer COVID-19 Vaccine 04/05/2020  1:04 PM 0.3 mL 03/10/2020 Intramuscular   Manufacturer: [employer], [employer]   Lot: [id number]   NDC: [address]-6

## 2020-04-24 ENCOUNTER — Other Ambulatory Visit: Payer: Self-pay | Admitting: Endocrinology

## 2020-04-24 DIAGNOSIS — E119 Type 2 diabetes mellitus without complications: Secondary | ICD-10-CM

## 2020-05-19 ENCOUNTER — Other Ambulatory Visit: Payer: Self-pay | Admitting: Endocrinology

## 2020-05-19 DIAGNOSIS — E119 Type 2 diabetes mellitus without complications: Secondary | ICD-10-CM

## 2020-05-25 ENCOUNTER — Other Ambulatory Visit: Payer: Self-pay | Admitting: Endocrinology

## 2020-05-25 DIAGNOSIS — E119 Type 2 diabetes mellitus without complications: Secondary | ICD-10-CM

## 2020-06-17 ENCOUNTER — Ambulatory Visit: Payer: Medicare HMO | Admitting: Endocrinology

## 2020-06-24 ENCOUNTER — Other Ambulatory Visit: Payer: Self-pay | Admitting: Endocrinology

## 2020-06-28 ENCOUNTER — Encounter (INDEPENDENT_AMBULATORY_CARE_PROVIDER_SITE_OTHER): Payer: Self-pay | Admitting: Ophthalmology

## 2020-07-01 NOTE — Progress Notes (Unsigned)
Patient ID: Scott Reynolds, male   DOB: [birth year], 80 y.o.   MRN: [id number]   Reason for Appointment:   follow-up of various issues  History of Present Illness   DIABETES:  He has had diabetes since 2001 which is usually well controlled.  Previously was taking glipizide and Actoplusmet but after about 2013  he was able to get consistent control with metformin 2000 mg only.  Oral hypoglycemic drugs used: Glipizide ER 2.5 mg daily, Metformin 1 g twice daily  Recent history, management and blood sugars:  A1c is relatively higher at 5.9 but still normal  He does not check his blood sugars at home  Today blood sugar is slightly higher at 137 fasting blood he has been out of his glipizide for a week  No hypoglycemic symptoms with taking glipizide ER which she has been taking since 7/21  Takes Metformin regularly and no diarrhea with this  Weight is about the same     Complications: bilateral leg amputation  Wt Readings from Last 3 Encounters:  07/02/20 218 lb (98.9 kg)  02/16/20 216 lb 6.4 oz (98.2 kg)  10/28/19 219 lb 6.4 oz (99.5 kg)   Lab Results  Component Value Date   HGBA1C 5.9 (A) 07/02/2020   HGBA1C 5.1 02/16/2020   HGBA1C 7.6 (A) 08/27/2019   Lab Results  Component Value Date   MICROALBUR 2.1 (H) 02/16/2020   LDLCALC 64 02/16/2020   CREATININE 1.07 02/16/2020     HYPERLIPIDEMIA:    Has a history of increased LDL; he has had consistent control with simvastatin 20 mg Labs previously:   Lab Results  Component Value Date   CHOL 117 02/16/2020   HDL 41.40 02/16/2020   LDLCALC 64 02/16/2020   TRIG 54.0 02/16/2020   CHOLHDL 3 02/16/2020   HYPERTENSION: See review of systems    Allergies as of 07/02/2020   No Known Allergies     Medication List       Accurate as of July 02, 2020  9:16 AM. If you have any questions, ask your nurse or doctor.        aspirin 81 MG tablet Take 81 mg by mouth daily.   bimatoprost 0.01 %  Soln Commonly known as: LUMIGAN 1 drop at bedtime.   brimonidine 0.15 % ophthalmic solution Commonly known as: ALPHAGAN   Combigan 0.2-0.5 % ophthalmic solution Generic drug: brimonidine-timolol   dorzolamide 2 % ophthalmic solution Commonly known as: TRUSOPT   glipiZIDE 2.5 MG 24 hr tablet Commonly known as: GLUCOTROL XL Take 1 tablet (2.5 mg total) by mouth daily with breakfast.   glucose blood test strip 1 each by Other route daily. Use OneTouch Verio test strips as instructed to check blood sugar once daily.   latanoprost 0.005 % ophthalmic solution Commonly known as: XALATAN   losartan 25 MG tablet Commonly known as: COZAAR TAKE 1 TABLET BY MOUTH TWICE A DAY   metFORMIN 1000 MG tablet Commonly known as: GLUCOPHAGE Take 1 tablet (1,000 mg total) by mouth 2 (two) times daily.   OneTouch Verio Flex System w/Device Kit 1 each by Does not apply route daily. Use OneTouch Verio Flex meter to check blood sugar once daily.   prednisoLONE acetate 1 % ophthalmic suspension Commonly known as: PRED FORTE   simvastatin 20 MG tablet Commonly known as: ZOCOR TAKE 1 TABLET BY MOUTH EVERYDAY AT BEDTIME   timolol 0.5 % ophthalmic solution Commonly known as: TIMOPTIC       Allergies: No  Known Allergies  Past Medical History:  Diagnosis Date  . Diabetes mellitus without complication ([place])   . Erectile dysfunction   . Glaucoma   . Hyperlipidemia   . Hypertension     Past Surgical History:  Procedure Laterality Date  . LEG AMPUTATION ABOVE KNEE Bilateral    2001 left leg and 2003 right leg    Family History  Problem Relation Age of Onset  . Diabetes Brother   . Cancer Neg Hx     Social History:  reports that he has never smoked. He has never used smokeless tobacco. No history on file for alcohol use and drug use.   ROS  He has regular eye exams from Dr. Venetia Maxon  He is followed at least twice a year for his glaucoma No recent reports available     Hypertension:    His blood pressure has been treated with losartan 25 mg twice a day Blood pressure is usually higher on the first measurement Does not monitor at home   BP Readings from Last 3 Encounters:  07/02/20 (!) 142/82  02/16/20 (!) 142/74  10/28/19 140/78   Lab Results  Component Value Date   CREATININE 1.07 02/16/2020   CREATININE 1.01 08/27/2019   CREATININE 0.94 01/20/2019    He was found to have a low B12 and he is taking 1000 mcg daily as recommended  Lab Results  Component Value Date   VITAMINB12 1,224 (H) 08/27/2019    He still has not called to set up a new PCP, has been advised to do so several times and has been given a list of available PCPs  He has had his Covid vaccine, 3 doses  Taking vitamin D and B12 supplements   Examination:   BP (!) 142/82   Pulse 100   Ht '5\' 10"'  (1.778 m)   Wt 218 lb (98.9 kg)   SpO2 99%   BMI 31.28 kg/m   Body mass index is 31.28 kg/m.   Physical Exam      Assesment:   Diabetes type 2 with mild obesity  His diabetes has been previously well controlled on metformin 1 g twice a day long-term  A1c is still normal at 5.9, previously 7.6  He is taking glipizide ER 2.5 mg in addition to his Metformin No hypoglycemic symptoms when taking this although has not taken any for the last week and glucoses relatively higher 137 Overall he is maintaining his diet and as much activity he has he can do  He will continue the same regimen   HYPERTENSION: Blood pressure is high normal again and not clear if his tachycardia is from his walking from the parking lot We will continue losartan 25 twice daily for now  Hypercholesterolemia: On simvastatin and will recheck labs today  History of B12 deficiency and anemia: We will recheck labs today  Also check TSH to rule out hyperthyroidism as cause of tachycardia  Again given a list of PCPs in the group for establishing as a new patient for general care Discussed that he  needs to be evaluated for general care and preventive measures     Elayne Snare 07/02/2020, 9:16 AM

## 2020-07-02 ENCOUNTER — Encounter: Payer: Self-pay | Admitting: Endocrinology

## 2020-07-02 ENCOUNTER — Ambulatory Visit: Payer: Medicare HMO | Admitting: Endocrinology

## 2020-07-02 ENCOUNTER — Other Ambulatory Visit: Payer: Self-pay

## 2020-07-02 VITALS — BP 142/82 | HR 100 | Ht 70.0 in | Wt 218.0 lb

## 2020-07-02 DIAGNOSIS — E669 Obesity, unspecified: Secondary | ICD-10-CM

## 2020-07-02 DIAGNOSIS — E7800 Pure hypercholesterolemia, unspecified: Secondary | ICD-10-CM | POA: Diagnosis not present

## 2020-07-02 DIAGNOSIS — E1169 Type 2 diabetes mellitus with other specified complication: Secondary | ICD-10-CM | POA: Diagnosis not present

## 2020-07-02 DIAGNOSIS — I10 Essential (primary) hypertension: Secondary | ICD-10-CM | POA: Diagnosis not present

## 2020-07-02 DIAGNOSIS — D649 Anemia, unspecified: Secondary | ICD-10-CM | POA: Diagnosis not present

## 2020-07-02 DIAGNOSIS — R000 Tachycardia, unspecified: Secondary | ICD-10-CM

## 2020-07-02 DIAGNOSIS — E119 Type 2 diabetes mellitus without complications: Secondary | ICD-10-CM

## 2020-07-02 LAB — CBC WITH DIFFERENTIAL/PLATELET
Basophils Absolute: 0 10*3/uL (ref 0.0–0.1)
Basophils Relative: 0.4 % (ref 0.0–3.0)
Eosinophils Absolute: 0 10*3/uL (ref 0.0–0.7)
Eosinophils Relative: 0.4 % (ref 0.0–5.0)
HCT: 42.7 % (ref 39.0–52.0)
Hemoglobin: 14.1 g/dL (ref 13.0–17.0)
Lymphocytes Relative: 17.3 % (ref 12.0–46.0)
Lymphs Abs: 1.3 10*3/uL (ref 0.7–4.0)
MCHC: 33 g/dL (ref 30.0–36.0)
MCV: 89.5 fl (ref 78.0–100.0)
Monocytes Absolute: 0.4 10*3/uL (ref 0.1–1.0)
Monocytes Relative: 5.2 % (ref 3.0–12.0)
Neutro Abs: 5.6 10*3/uL (ref 1.4–7.7)
Neutrophils Relative %: 76.7 % (ref 43.0–77.0)
Platelets: 204 10*3/uL (ref 150.0–400.0)
RBC: 4.77 Mil/uL (ref 4.22–5.81)
RDW: 12.9 % (ref 11.5–15.5)
WBC: 7.3 10*3/uL (ref 4.0–10.5)

## 2020-07-02 LAB — COMPREHENSIVE METABOLIC PANEL
ALT: 10 U/L (ref 0–53)
AST: 16 U/L (ref 0–37)
Albumin: 4.6 g/dL (ref 3.5–5.2)
Alkaline Phosphatase: 88 U/L (ref 39–117)
BUN: 22 mg/dL (ref 6–23)
CO2: 27 mEq/L (ref 19–32)
Calcium: 10.1 mg/dL (ref 8.4–10.5)
Chloride: 100 mEq/L (ref 96–112)
Creatinine, Ser: 1.06 mg/dL (ref 0.40–1.50)
GFR: 66.31 mL/min (ref >60.00)
Glucose, Bld: 139 mg/dL — ABNORMAL HIGH (ref 70–99)
Potassium: 4.5 mEq/L (ref 3.5–5.1)
Sodium: 138 mEq/L (ref 135–145)
Total Bilirubin: 1.1 mg/dL (ref 0.2–1.2)
Total Protein: 8.3 g/dL (ref 6.0–8.3)

## 2020-07-02 LAB — POCT GLUCOSE (DEVICE FOR HOME USE): Glucose Fasting, POC: 137 mg/dL — AB (ref 70–99)

## 2020-07-02 LAB — POCT GLYCOSYLATED HEMOGLOBIN (HGB A1C): Hemoglobin A1C: 5.9 % — AB (ref 4.0–5.6)

## 2020-07-02 LAB — LIPID PANEL
Cholesterol: 125 mg/dL (ref 0–200)
HDL: 48.3 mg/dL (ref >39.00)
LDL Cholesterol: 67 mg/dL (ref 0–99)
NonHDL: 77.15
Total CHOL/HDL Ratio: 3
Triglycerides: 53 mg/dL (ref 0.0–149.0)
VLDL: 10.6 mg/dL (ref 0.0–40.0)

## 2020-07-02 LAB — TSH: TSH: 2.14 u[IU]/mL (ref 0.35–4.50)

## 2020-07-02 MED ORDER — GLIPIZIDE ER 2.5 MG PO TB24
2.5000 mg | ORAL_TABLET | Freq: Every day | ORAL | 1 refills | Status: DC
Start: 2020-07-02 — End: 2021-09-13

## 2020-07-04 NOTE — Progress Notes (Signed)
Please call to let patient know that the lab results are normal and no further action needed

## 2020-07-05 ENCOUNTER — Encounter (INDEPENDENT_AMBULATORY_CARE_PROVIDER_SITE_OTHER): Payer: Self-pay | Admitting: Ophthalmology

## 2020-07-09 ENCOUNTER — Encounter (INDEPENDENT_AMBULATORY_CARE_PROVIDER_SITE_OTHER): Payer: Medicare HMO | Admitting: Ophthalmology

## 2020-07-09 ENCOUNTER — Other Ambulatory Visit: Payer: Self-pay

## 2020-07-09 DIAGNOSIS — H35031 Hypertensive retinopathy, right eye: Secondary | ICD-10-CM

## 2020-07-09 DIAGNOSIS — I10 Essential (primary) hypertension: Secondary | ICD-10-CM | POA: Diagnosis not present

## 2020-07-09 DIAGNOSIS — E113511 Type 2 diabetes mellitus with proliferative diabetic retinopathy with macular edema, right eye: Secondary | ICD-10-CM

## 2020-07-09 DIAGNOSIS — H4312 Vitreous hemorrhage, left eye: Secondary | ICD-10-CM | POA: Diagnosis not present

## 2020-07-09 DIAGNOSIS — H43813 Vitreous degeneration, bilateral: Secondary | ICD-10-CM

## 2020-07-15 ENCOUNTER — Other Ambulatory Visit: Payer: Self-pay

## 2020-07-15 ENCOUNTER — Encounter (INDEPENDENT_AMBULATORY_CARE_PROVIDER_SITE_OTHER): Payer: Medicare HMO | Admitting: Ophthalmology

## 2020-07-15 DIAGNOSIS — E113511 Type 2 diabetes mellitus with proliferative diabetic retinopathy with macular edema, right eye: Secondary | ICD-10-CM | POA: Diagnosis not present

## 2020-08-01 ENCOUNTER — Other Ambulatory Visit: Payer: Self-pay | Admitting: Endocrinology

## 2020-08-12 ENCOUNTER — Encounter (INDEPENDENT_AMBULATORY_CARE_PROVIDER_SITE_OTHER): Payer: Medicare HMO | Admitting: Ophthalmology

## 2020-08-12 ENCOUNTER — Other Ambulatory Visit: Payer: Self-pay

## 2020-08-12 DIAGNOSIS — I10 Essential (primary) hypertension: Secondary | ICD-10-CM

## 2020-08-12 DIAGNOSIS — E113511 Type 2 diabetes mellitus with proliferative diabetic retinopathy with macular edema, right eye: Secondary | ICD-10-CM | POA: Diagnosis not present

## 2020-08-12 DIAGNOSIS — H35031 Hypertensive retinopathy, right eye: Secondary | ICD-10-CM | POA: Diagnosis not present

## 2020-08-12 DIAGNOSIS — H43811 Vitreous degeneration, right eye: Secondary | ICD-10-CM | POA: Diagnosis not present

## 2020-08-23 ENCOUNTER — Other Ambulatory Visit: Payer: Self-pay | Admitting: Endocrinology

## 2020-08-23 DIAGNOSIS — E119 Type 2 diabetes mellitus without complications: Secondary | ICD-10-CM

## 2020-08-24 ENCOUNTER — Other Ambulatory Visit: Payer: Self-pay | Admitting: Endocrinology

## 2020-09-16 ENCOUNTER — Other Ambulatory Visit: Payer: Self-pay

## 2020-09-16 ENCOUNTER — Encounter (INDEPENDENT_AMBULATORY_CARE_PROVIDER_SITE_OTHER): Payer: Medicare HMO | Admitting: Ophthalmology

## 2020-09-16 DIAGNOSIS — H35031 Hypertensive retinopathy, right eye: Secondary | ICD-10-CM

## 2020-09-16 DIAGNOSIS — I10 Essential (primary) hypertension: Secondary | ICD-10-CM | POA: Diagnosis not present

## 2020-09-16 DIAGNOSIS — E113511 Type 2 diabetes mellitus with proliferative diabetic retinopathy with macular edema, right eye: Secondary | ICD-10-CM

## 2020-09-16 DIAGNOSIS — H43811 Vitreous degeneration, right eye: Secondary | ICD-10-CM

## 2020-10-21 ENCOUNTER — Other Ambulatory Visit: Payer: Self-pay

## 2020-10-21 ENCOUNTER — Encounter (INDEPENDENT_AMBULATORY_CARE_PROVIDER_SITE_OTHER): Payer: Medicare HMO | Admitting: Ophthalmology

## 2020-10-21 DIAGNOSIS — E113511 Type 2 diabetes mellitus with proliferative diabetic retinopathy with macular edema, right eye: Secondary | ICD-10-CM

## 2020-10-21 DIAGNOSIS — H43811 Vitreous degeneration, right eye: Secondary | ICD-10-CM

## 2020-10-21 DIAGNOSIS — H35031 Hypertensive retinopathy, right eye: Secondary | ICD-10-CM

## 2020-10-21 DIAGNOSIS — I10 Essential (primary) hypertension: Secondary | ICD-10-CM

## 2020-10-27 ENCOUNTER — Ambulatory Visit: Payer: Medicare HMO | Attending: Internal Medicine

## 2020-10-27 DIAGNOSIS — Z23 Encounter for immunization: Secondary | ICD-10-CM

## 2020-10-27 NOTE — Progress Notes (Signed)
   Covid-19 Vaccination Clinic  Name:  Orvel Cutsforth    MRN: [id number] DOB: 03-02-1941  10/27/2020  Mr. Sjogren was observed post Covid-19 immunization for 15 minutes without incident. He was provided with Vaccine Information Sheet and instruction to access the V-Safe system.   Mr. Vogelsang was instructed to call 911 with any severe reactions post vaccine: Marland Kitchen Difficulty breathing  . Swelling of face and throat  . A fast heartbeat  . A bad rash all over body  . Dizziness and weakness   Immunizations Administered    Name Date Dose VIS Date Route   PFIZER Comrnaty(Gray TOP) Covid-19 Vaccine 10/27/2020  9:20 AM 0.3 mL 04/29/2020 Intramuscular   Manufacturer: [employer], [employer]   Lot: [id number]   NDC: [phone]

## 2020-11-02 ENCOUNTER — Other Ambulatory Visit (HOSPITAL_COMMUNITY): Payer: Self-pay

## 2020-11-02 MED ORDER — COVID-19 MRNA VAC-TRIS(PFIZER) 30 MCG/0.3ML IM SUSP
INTRAMUSCULAR | 0 refills | Status: DC
  Filled 2020-11-02: qty 0.3, 17d supply, fill #0

## 2020-11-03 ENCOUNTER — Other Ambulatory Visit (HOSPITAL_COMMUNITY): Payer: Self-pay

## 2020-11-11 ENCOUNTER — Other Ambulatory Visit (HOSPITAL_COMMUNITY): Payer: Self-pay

## 2020-11-21 ENCOUNTER — Other Ambulatory Visit: Payer: Self-pay | Admitting: Endocrinology

## 2020-11-25 ENCOUNTER — Encounter (INDEPENDENT_AMBULATORY_CARE_PROVIDER_SITE_OTHER): Payer: Medicare HMO | Admitting: Ophthalmology

## 2020-12-02 ENCOUNTER — Encounter (INDEPENDENT_AMBULATORY_CARE_PROVIDER_SITE_OTHER): Payer: Medicare HMO | Admitting: Ophthalmology

## 2020-12-03 ENCOUNTER — Other Ambulatory Visit: Payer: Self-pay

## 2020-12-03 ENCOUNTER — Encounter (INDEPENDENT_AMBULATORY_CARE_PROVIDER_SITE_OTHER): Payer: Medicare HMO | Admitting: Ophthalmology

## 2020-12-03 DIAGNOSIS — I10 Essential (primary) hypertension: Secondary | ICD-10-CM

## 2020-12-03 DIAGNOSIS — E113511 Type 2 diabetes mellitus with proliferative diabetic retinopathy with macular edema, right eye: Secondary | ICD-10-CM

## 2020-12-03 DIAGNOSIS — H35031 Hypertensive retinopathy, right eye: Secondary | ICD-10-CM | POA: Diagnosis not present

## 2020-12-03 DIAGNOSIS — H43811 Vitreous degeneration, right eye: Secondary | ICD-10-CM | POA: Diagnosis not present

## 2020-12-29 NOTE — Progress Notes (Deleted)
Patient ID: Scott Reynolds, male   DOB: [birth year], 80 y.o.   MRN: [id number]   Reason for Appointment:   follow-up of various issues  History of Present Illness   DIABETES:  He has had diabetes since 2001 which is usually well controlled.  Previously was taking glipizide and Actoplusmet but after about 2013  he was able to get consistent control with metformin 2000 mg only.  Oral hypoglycemic drugs used: Glipizide ER 2.5 mg daily, Metformin 1 g twice daily  Recent history, management and blood sugars: A1c is relatively higher at 5.9 but still normal He does not check his blood sugars at home Today blood sugar is slightly higher at 137 fasting blood he has been out of his glipizide for a week No hypoglycemic symptoms with taking glipizide ER which she has been taking since 7/21 Takes Metformin regularly and no diarrhea with this Weight is about the same     Complications: bilateral leg amputation  Wt Readings from Last 3 Encounters:  07/02/20 218 lb (98.9 kg)  02/16/20 216 lb 6.4 oz (98.2 kg)  10/28/19 219 lb 6.4 oz (99.5 kg)   Lab Results  Component Value Date   HGBA1C 5.9 (A) 07/02/2020   HGBA1C 5.1 02/16/2020   HGBA1C 7.6 (A) 08/27/2019   Lab Results  Component Value Date   MICROALBUR 2.1 (H) 02/16/2020   LDLCALC 67 07/02/2020   CREATININE 1.06 07/02/2020     HYPERLIPIDEMIA:    Has a history of increased LDL; he has had consistent control with simvastatin 20 mg Labs previously:   Lab Results  Component Value Date   CHOL 125 07/02/2020   HDL 48.30 07/02/2020   LDLCALC 67 07/02/2020   TRIG 53.0 07/02/2020   CHOLHDL 3 07/02/2020   HYPERTENSION: See review of systems    Allergies as of 12/30/2020   No Known Allergies      Medication List        Accurate as of December 29, 2020  4:46 PM. If you have any questions, ask your nurse or doctor.          aspirin 81 MG tablet Take 81 mg by mouth daily.   bimatoprost 0.01 % Soln Commonly  known as: LUMIGAN 1 drop at bedtime.   brimonidine 0.15 % ophthalmic solution Commonly known as: ALPHAGAN   Combigan 0.2-0.5 % ophthalmic solution Generic drug: brimonidine-timolol   dorzolamide 2 % ophthalmic solution Commonly known as: TRUSOPT   glipiZIDE 2.5 MG 24 hr tablet Commonly known as: GLUCOTROL XL Take 1 tablet (2.5 mg total) by mouth daily with breakfast.   glucose blood test strip 1 each by Other route daily. Use OneTouch Verio test strips as instructed to check blood sugar once daily.   latanoprost 0.005 % ophthalmic solution Commonly known as: XALATAN   losartan 25 MG tablet Commonly known as: COZAAR TAKE 1 TABLET BY MOUTH TWICE A DAY   metFORMIN 1000 MG tablet Commonly known as: GLUCOPHAGE TAKE 1 TABLET BY MOUTH TWICE A DAY   OneTouch Verio Flex System w/Device Kit 1 each by Does not apply route daily. Use OneTouch Verio Flex meter to check blood sugar once daily.   Pfizer-BioNT COVID-19 Vac-TriS Susp injection Generic drug: COVID-19 mRNA Vac-TriS (Pfizer) Inject into the muscle.   prednisoLONE acetate 1 % ophthalmic suspension Commonly known as: PRED FORTE   simvastatin 20 MG tablet Commonly known as: ZOCOR TAKE 1 TABLET BY MOUTH EVERYDAY AT BEDTIME   timolol 0.5 % ophthalmic solution Commonly  known as: TIMOPTIC        Allergies: No Known Allergies  Past Medical History:  Diagnosis Date   Diabetes mellitus without complication ([place])    Erectile dysfunction    Glaucoma    Hyperlipidemia    Hypertension     Past Surgical History:  Procedure Laterality Date   LEG AMPUTATION ABOVE KNEE Bilateral    2001 left leg and 2003 right leg    Family History  Problem Relation Age of Onset   Diabetes Brother    Cancer Neg Hx     Social History:  reports that he has never smoked. He has never used smokeless tobacco. No history on file for alcohol use and drug use.   ROS  He has regular eye exams from Dr. Venetia Maxon  He is followed at  least twice a year for his glaucoma No recent reports available    Hypertension:    His blood pressure has been treated with losartan 25 mg twice a day Blood pressure is usually higher on the first measurement Does not monitor at home   BP Readings from Last 3 Encounters:  07/02/20 (!) 142/82  02/16/20 (!) 142/74  10/28/19 140/78   Lab Results  Component Value Date   CREATININE 1.06 07/02/2020   CREATININE 1.07 02/16/2020   CREATININE 1.01 08/27/2019    He was found to have a low B12 and he is taking 1000 mcg daily as recommended  Lab Results  Component Value Date   VITAMINB12 1,224 (H) 08/27/2019    He still has not called to set up a new PCP, has been advised to do so several times and has been given a list of available PCPs  He has had his Covid vaccine, 3 doses  Taking vitamin D and B12 supplements   Examination:   There were no vitals taken for this visit.  There is no height or weight on file to calculate BMI.   Physical Exam      Assesment:   Diabetes type 2 with mild obesity  His diabetes has been previously well controlled on metformin 1 g twice a day long-term  A1c is still normal at 5.9, previously 7.6  He is taking glipizide ER 2.5 mg in addition to his Metformin No hypoglycemic symptoms when taking this although has not taken any for the last week and glucoses relatively higher 137 Overall he is maintaining his diet and as much activity he has he can do  He will continue the same regimen   HYPERTENSION: Blood pressure is high normal again and not clear if his tachycardia is from his walking from the parking lot We will continue losartan 25 twice daily for now  Hypercholesterolemia: On simvastatin and will recheck labs today  History of B12 deficiency and anemia: We will recheck labs today  Also check TSH to rule out hyperthyroidism as cause of tachycardia  Again given a list of PCPs in the group for establishing as a new patient for  general care Discussed that he needs to be evaluated for general care and preventive measures     Elayne Snare 12/29/2020, 4:46 PM

## 2020-12-30 ENCOUNTER — Ambulatory Visit: Payer: Medicare HMO | Admitting: Endocrinology

## 2020-12-30 DIAGNOSIS — E1169 Type 2 diabetes mellitus with other specified complication: Secondary | ICD-10-CM

## 2020-12-31 ENCOUNTER — Other Ambulatory Visit: Payer: Self-pay

## 2020-12-31 ENCOUNTER — Encounter (INDEPENDENT_AMBULATORY_CARE_PROVIDER_SITE_OTHER): Payer: Medicare HMO | Admitting: Ophthalmology

## 2020-12-31 DIAGNOSIS — I10 Essential (primary) hypertension: Secondary | ICD-10-CM | POA: Diagnosis not present

## 2020-12-31 DIAGNOSIS — H35031 Hypertensive retinopathy, right eye: Secondary | ICD-10-CM

## 2020-12-31 DIAGNOSIS — E113511 Type 2 diabetes mellitus with proliferative diabetic retinopathy with macular edema, right eye: Secondary | ICD-10-CM

## 2020-12-31 DIAGNOSIS — H43811 Vitreous degeneration, right eye: Secondary | ICD-10-CM

## 2021-01-11 ENCOUNTER — Other Ambulatory Visit: Payer: Self-pay

## 2021-01-11 ENCOUNTER — Encounter: Payer: Self-pay | Admitting: Endocrinology

## 2021-01-11 ENCOUNTER — Ambulatory Visit: Payer: Medicare HMO | Admitting: Endocrinology

## 2021-01-11 VITALS — BP 140/70 | HR 103 | Ht 68.0 in | Wt 194.2 lb

## 2021-01-11 DIAGNOSIS — I10 Essential (primary) hypertension: Secondary | ICD-10-CM | POA: Diagnosis not present

## 2021-01-11 DIAGNOSIS — E1169 Type 2 diabetes mellitus with other specified complication: Secondary | ICD-10-CM

## 2021-01-11 DIAGNOSIS — E669 Obesity, unspecified: Secondary | ICD-10-CM

## 2021-01-11 DIAGNOSIS — E538 Deficiency of other specified B group vitamins: Secondary | ICD-10-CM | POA: Diagnosis not present

## 2021-01-11 LAB — COMPREHENSIVE METABOLIC PANEL
ALT: 7 U/L (ref 0–53)
AST: 11 U/L (ref 0–37)
Albumin: 4.5 g/dL (ref 3.5–5.2)
Alkaline Phosphatase: 91 U/L (ref 39–117)
BUN: 16 mg/dL (ref 6–23)
CO2: 23 mEq/L (ref 19–32)
Calcium: 10.1 mg/dL (ref 8.4–10.5)
Chloride: 107 mEq/L (ref 96–112)
Creatinine, Ser: 0.99 mg/dL (ref 0.40–1.50)
GFR: 71.71 mL/min (ref >60.00)
Glucose, Bld: 96 mg/dL (ref 70–99)
Potassium: 3.7 mEq/L (ref 3.5–5.1)
Sodium: 140 mEq/L (ref 135–145)
Total Bilirubin: 1.4 mg/dL — ABNORMAL HIGH (ref 0.2–1.2)
Total Protein: 7.8 g/dL (ref 6.0–8.3)

## 2021-01-11 LAB — CBC WITH DIFFERENTIAL/PLATELET
Basophils Absolute: 0 10*3/uL (ref 0.0–0.1)
Basophils Relative: 0.7 % (ref 0.0–3.0)
Eosinophils Absolute: 0 10*3/uL (ref 0.0–0.7)
Eosinophils Relative: 0.9 % (ref 0.0–5.0)
HCT: 40.8 % (ref 39.0–52.0)
Hemoglobin: 13.5 g/dL (ref 13.0–17.0)
Lymphocytes Relative: 22.4 % (ref 12.0–46.0)
Lymphs Abs: 1.3 10*3/uL (ref 0.7–4.0)
MCHC: 33.1 g/dL (ref 30.0–36.0)
MCV: 92.2 fl (ref 78.0–100.0)
Monocytes Absolute: 0.4 10*3/uL (ref 0.1–1.0)
Monocytes Relative: 6.9 % (ref 3.0–12.0)
Neutro Abs: 3.9 10*3/uL (ref 1.4–7.7)
Neutrophils Relative %: 69.1 % (ref 43.0–77.0)
Platelets: 196 10*3/uL (ref 150.0–400.0)
RBC: 4.43 Mil/uL (ref 4.22–5.81)
RDW: 13.8 % (ref 11.5–15.5)
WBC: 5.7 10*3/uL (ref 4.0–10.5)

## 2021-01-11 LAB — MICROALBUMIN / CREATININE URINE RATIO
Creatinine,U: 195 mg/dL
Microalb Creat Ratio: 1.1 mg/g (ref 0.0–30.0)
Microalb, Ur: 2.2 mg/dL — ABNORMAL HIGH (ref 0.0–1.9)

## 2021-01-11 LAB — POCT GLYCOSYLATED HEMOGLOBIN (HGB A1C): Hemoglobin A1C: 5.1 % (ref 4.0–5.6)

## 2021-01-11 NOTE — Progress Notes (Signed)
Patient ID: Scott Reynolds, male   DOB: Jan 30, 1941, 80 y.o.   MRN: [id number]   Reason for Appointment:   follow-up of various issues  History of Present Illness   DIABETES:  He has had diabetes since 2001 which is usually well controlled.  Previously was taking glipizide and Actoplusmet but after about 2013  he was able to get consistent control with metformin 2000 mg only.  Oral hypoglycemic drugs used: Glipizide ER 2.5 mg daily, Metformin 1 g twice daily  Recent history, management and blood sugars: A1c is better than before at 5.1, previously was higher at 5.9  He appears to have lost a significant amount of weight and he thinks he is just eating less even though he is having a fairly good appetite He does not check his blood sugars at home No hypoglycemic symptoms with taking glipizide ER which he has been taking since 7/21 Takes Metformin regularly without side effects  Home blood sugars not available   Complications: bilateral leg amputation  Wt Readings from Last 3 Encounters:  01/11/21 194 lb 3.2 oz (88.1 kg)  07/02/20 218 lb (98.9 kg)  02/16/20 216 lb 6.4 oz (98.2 kg)   Lab Results  Component Value Date   HGBA1C 5.1 01/11/2021   HGBA1C 5.9 (A) 07/02/2020   HGBA1C 5.1 02/16/2020   Lab Results  Component Value Date   MICROALBUR 2.1 (H) 02/16/2020   LDLCALC 67 07/02/2020   CREATININE 1.06 07/02/2020     HYPERLIPIDEMIA:    Has a history of increased LDL; he has had good control with simvastatin 20 mg Labs previously:   Lab Results  Component Value Date   CHOL 125 07/02/2020   HDL 48.30 07/02/2020   LDLCALC 67 07/02/2020   TRIG 53.0 07/02/2020   CHOLHDL 3 07/02/2020   HYPERTENSION: See review of systems    Allergies as of 01/11/2021   No Known Allergies      Medication List        Accurate as of January 11, 2021 11:56 AM. If you have any questions, ask your nurse or doctor.          aspirin 81 MG tablet Take 81 mg by mouth  daily.   bimatoprost 0.01 % Soln Commonly known as: LUMIGAN 1 drop at bedtime.   brimonidine 0.15 % ophthalmic solution Commonly known as: ALPHAGAN   Combigan 0.2-0.5 % ophthalmic solution Generic drug: brimonidine-timolol   dorzolamide 2 % ophthalmic solution Commonly known as: TRUSOPT   glipiZIDE 2.5 MG 24 hr tablet Commonly known as: GLUCOTROL XL Take 1 tablet (2.5 mg total) by mouth daily with breakfast.   glucose blood test strip 1 each by Other route daily. Use OneTouch Verio test strips as instructed to check blood sugar once daily.   latanoprost 0.005 % ophthalmic solution Commonly known as: XALATAN   losartan 25 MG tablet Commonly known as: COZAAR TAKE 1 TABLET BY MOUTH TWICE A DAY   metFORMIN 1000 MG tablet Commonly known as: GLUCOPHAGE TAKE 1 TABLET BY MOUTH TWICE A DAY   OneTouch Verio Flex System w/Device Kit 1 each by Does not apply route daily. Use OneTouch Verio Flex meter to check blood sugar once daily.   Pfizer-BioNT COVID-19 Vac-TriS Susp injection Generic drug: COVID-19 mRNA Vac-TriS (Pfizer) Inject into the muscle.   prednisoLONE acetate 1 % ophthalmic suspension Commonly known as: PRED FORTE   simvastatin 20 MG tablet Commonly known as: ZOCOR TAKE 1 TABLET BY MOUTH EVERYDAY AT BEDTIME  timolol 0.5 % ophthalmic solution Commonly known as: TIMOPTIC        Allergies: No Known Allergies  Past Medical History:  Diagnosis Date   Diabetes mellitus without complication ([place])    Erectile dysfunction    Glaucoma    Hyperlipidemia    Hypertension     Past Surgical History:  Procedure Laterality Date   LEG AMPUTATION ABOVE KNEE Bilateral    2001 left leg and 2003 right leg    Family History  Problem Relation Age of Onset   Diabetes Brother    Cancer Neg Hx     Social History:  reports that he has never smoked. He has never used smokeless tobacco. No history on file for alcohol use and drug use.   ROS  He has regular eye  exams from Dr. Venetia Maxon  He is followed at least twice a year for his glaucoma No recent reports available    Hypertension:    His blood pressure has been treated with losartan 25 mg twice a day Blood pressure is usually higher on the first measurement Does not monitor at home Did not take his medication today   BP Readings from Last 3 Encounters:  01/11/21 140/70  07/02/20 (!) 142/82  02/16/20 (!) 142/74   Lab Results  Component Value Date   CREATININE 1.06 07/02/2020   CREATININE 1.07 02/16/2020   CREATININE 1.01 08/27/2019    He was found to have a low B12 and he is taking 1000 mcg daily as before  Lab Results  Component Value Date   VITAMINB12 1,224 (H) 08/27/2019    He still has not called to set up a new PCP, has been advised to do so several times and has been given a list of available PCPs  He has had his Covid vaccine, 3 doses  Taking vitamin D OTC   Examination:   BP 140/70   Pulse (!) 103   Ht _0  (1.727 m)   Wt 194 lb 3.2 oz (88.1 kg)   SpO2 98%   BMI 29.53 kg/m   Body mass index is 29.53 kg/m.   Physical Exam       Assesment:   Diabetes type 2 with mild obesity  His diabetes has been previously well controlled on metformin 1 g twice a day long-term  A1c is still normal at 5.1 although slightly lower  He is taking glipizide ER 2.5 mg in addition to his Metformin Not clear what his blood sugars are since he does not monitor Since he has lost weight and etiology is unclear we will have him stop his morning metformin for now Continue glipizide unless he has symptoms of low sugars   HYPERTENSION: Blood pressure is high normal but he did not take his morning medication today  Encouraged him to start checking at home also  Microalbumin to be checked  History of B12 deficiency and anemia: We will recheck CBC today  Mild tachycardia: Previously TSH has been normal and his pulse is usually relatively fast in the office  Again given a  list of PCPs in the group for establishing as a new patient for general care Importance of evaluation for his weight loss was also discussed Follow-up in 4 months     Scott Reynolds 01/11/2021, 11:56 AM

## 2021-01-11 NOTE — Patient Instructions (Signed)
Leave off Metformin in am

## 2021-01-11 NOTE — Progress Notes (Signed)
Please call to let patient know that the lab results are normal and no further action needed

## 2021-01-28 ENCOUNTER — Other Ambulatory Visit: Payer: Self-pay

## 2021-01-28 ENCOUNTER — Encounter (INDEPENDENT_AMBULATORY_CARE_PROVIDER_SITE_OTHER): Payer: Medicare HMO | Admitting: Ophthalmology

## 2021-01-28 DIAGNOSIS — H35031 Hypertensive retinopathy, right eye: Secondary | ICD-10-CM | POA: Diagnosis not present

## 2021-01-28 DIAGNOSIS — E113511 Type 2 diabetes mellitus with proliferative diabetic retinopathy with macular edema, right eye: Secondary | ICD-10-CM

## 2021-01-28 DIAGNOSIS — I10 Essential (primary) hypertension: Secondary | ICD-10-CM

## 2021-01-28 DIAGNOSIS — H43811 Vitreous degeneration, right eye: Secondary | ICD-10-CM

## 2021-02-17 ENCOUNTER — Other Ambulatory Visit: Payer: Self-pay | Admitting: Endocrinology

## 2021-02-24 ENCOUNTER — Other Ambulatory Visit: Payer: Self-pay | Admitting: Endocrinology

## 2021-02-24 DIAGNOSIS — E119 Type 2 diabetes mellitus without complications: Secondary | ICD-10-CM

## 2021-02-25 ENCOUNTER — Encounter (INDEPENDENT_AMBULATORY_CARE_PROVIDER_SITE_OTHER): Payer: Medicare HMO | Admitting: Ophthalmology

## 2021-02-25 ENCOUNTER — Other Ambulatory Visit: Payer: Self-pay

## 2021-02-25 DIAGNOSIS — H43811 Vitreous degeneration, right eye: Secondary | ICD-10-CM

## 2021-02-25 DIAGNOSIS — I10 Essential (primary) hypertension: Secondary | ICD-10-CM | POA: Diagnosis not present

## 2021-02-25 DIAGNOSIS — E113511 Type 2 diabetes mellitus with proliferative diabetic retinopathy with macular edema, right eye: Secondary | ICD-10-CM | POA: Diagnosis not present

## 2021-02-25 DIAGNOSIS — H35031 Hypertensive retinopathy, right eye: Secondary | ICD-10-CM | POA: Diagnosis not present

## 2021-03-25 ENCOUNTER — Encounter (INDEPENDENT_AMBULATORY_CARE_PROVIDER_SITE_OTHER): Payer: Medicare HMO | Admitting: Ophthalmology

## 2021-03-25 ENCOUNTER — Other Ambulatory Visit: Payer: Self-pay

## 2021-03-25 DIAGNOSIS — E113511 Type 2 diabetes mellitus with proliferative diabetic retinopathy with macular edema, right eye: Secondary | ICD-10-CM | POA: Diagnosis not present

## 2021-03-25 DIAGNOSIS — H43811 Vitreous degeneration, right eye: Secondary | ICD-10-CM | POA: Diagnosis not present

## 2021-03-25 DIAGNOSIS — I10 Essential (primary) hypertension: Secondary | ICD-10-CM

## 2021-03-25 DIAGNOSIS — H35031 Hypertensive retinopathy, right eye: Secondary | ICD-10-CM | POA: Diagnosis not present

## 2021-04-07 ENCOUNTER — Other Ambulatory Visit: Payer: Self-pay | Admitting: Endocrinology

## 2021-04-22 ENCOUNTER — Encounter (INDEPENDENT_AMBULATORY_CARE_PROVIDER_SITE_OTHER): Payer: Medicare HMO | Admitting: Ophthalmology

## 2021-04-22 ENCOUNTER — Other Ambulatory Visit: Payer: Self-pay

## 2021-04-22 DIAGNOSIS — H43811 Vitreous degeneration, right eye: Secondary | ICD-10-CM

## 2021-04-22 DIAGNOSIS — E113511 Type 2 diabetes mellitus with proliferative diabetic retinopathy with macular edema, right eye: Secondary | ICD-10-CM

## 2021-04-22 DIAGNOSIS — H35031 Hypertensive retinopathy, right eye: Secondary | ICD-10-CM

## 2021-04-22 DIAGNOSIS — I10 Essential (primary) hypertension: Secondary | ICD-10-CM | POA: Diagnosis not present

## 2021-05-06 ENCOUNTER — Encounter: Payer: Self-pay | Admitting: Endocrinology

## 2021-05-06 ENCOUNTER — Ambulatory Visit: Payer: Medicare HMO | Admitting: Endocrinology

## 2021-05-06 ENCOUNTER — Other Ambulatory Visit: Payer: Self-pay

## 2021-05-06 VITALS — BP 110/72 | HR 98 | Ht 68.0 in | Wt 193.6 lb

## 2021-05-06 DIAGNOSIS — E119 Type 2 diabetes mellitus without complications: Secondary | ICD-10-CM | POA: Diagnosis not present

## 2021-05-06 DIAGNOSIS — E669 Obesity, unspecified: Secondary | ICD-10-CM

## 2021-05-06 DIAGNOSIS — E7800 Pure hypercholesterolemia, unspecified: Secondary | ICD-10-CM

## 2021-05-06 DIAGNOSIS — I10 Essential (primary) hypertension: Secondary | ICD-10-CM

## 2021-05-06 DIAGNOSIS — E1169 Type 2 diabetes mellitus with other specified complication: Secondary | ICD-10-CM | POA: Diagnosis not present

## 2021-05-06 DIAGNOSIS — Z23 Encounter for immunization: Secondary | ICD-10-CM

## 2021-05-06 LAB — COMPREHENSIVE METABOLIC PANEL
ALT: 9 U/L (ref 0–53)
AST: 12 U/L (ref 0–37)
Albumin: 4.3 g/dL (ref 3.5–5.2)
Alkaline Phosphatase: 91 U/L (ref 39–117)
BUN: 21 mg/dL (ref 6–23)
CO2: 21 mEq/L (ref 19–32)
Calcium: 9.7 mg/dL (ref 8.4–10.5)
Chloride: 107 mEq/L (ref 96–112)
Creatinine, Ser: 1.06 mg/dL (ref 0.40–1.50)
GFR: 65.92 mL/min (ref >60.00)
Glucose, Bld: 117 mg/dL — ABNORMAL HIGH (ref 70–99)
Potassium: 4.3 mEq/L (ref 3.5–5.1)
Sodium: 139 mEq/L (ref 135–145)
Total Bilirubin: 0.9 mg/dL (ref 0.2–1.2)
Total Protein: 7.4 g/dL (ref 6.0–8.3)

## 2021-05-06 LAB — LIPID PANEL
Cholesterol: 113 mg/dL (ref 0–200)
HDL: 52 mg/dL (ref >39.00)
LDL Cholesterol: 52 mg/dL (ref 0–99)
NonHDL: 61.14
Total CHOL/HDL Ratio: 2
Triglycerides: 47 mg/dL (ref 0.0–149.0)
VLDL: 9.4 mg/dL (ref 0.0–40.0)

## 2021-05-06 LAB — POCT GLYCOSYLATED HEMOGLOBIN (HGB A1C): Hemoglobin A1C: 5.8 % — AB (ref 4.0–5.6)

## 2021-05-06 LAB — POCT GLUCOSE (DEVICE FOR HOME USE): Glucose Fasting, POC: 129 mg/dL — AB (ref 70–99)

## 2021-05-06 NOTE — Progress Notes (Signed)
Patient ID: Scott Reynolds, male   DOB: Nov 25, 1940, 80 y.o.   MRN: [id number]   Reason for Appointment:   follow-up of various issues  History of Present Illness   DIABETES:  He has had diabetes since 2001 which is usually well controlled.  Previously was taking glipizide and Actoplusmet but after about 2013  he was able to get consistent control with metformin 2000 mg only.  Oral hypoglycemic drugs used: Glipizide ER 2.5 mg daily, Metformin 1 g twice daily  Recent history, management and blood sugars: A1c is still in the normal range at 5.8 on the previously was 5.1 As usual he does not check his blood sugars at home despite having a meter given Fasting glucose in the office today is 129 and similar to previous range He says he is eating healthy meals and not eating out much  His weight has leveled off No hypoglycemic symptoms with taking 2.5 mg glipizide ER which he has been taking since 7/21 Takes Metformin regularly as before  Home blood sugars not available   Complications: bilateral leg amputation  Wt Readings from Last 3 Encounters:  05/06/21 193 lb 9.6 oz (87.8 kg)  01/11/21 194 lb 3.2 oz (88.1 kg)  07/02/20 218 lb (98.9 kg)   Lab Results  Component Value Date   HGBA1C 5.8 (A) 05/06/2021   HGBA1C 5.1 01/11/2021   HGBA1C 5.9 (A) 07/02/2020   Lab Results  Component Value Date   MICROALBUR 2.2 (H) 01/11/2021   LDLCALC 67 07/02/2020   CREATININE 0.99 01/11/2021     HYPERLIPIDEMIA:    Has a history of increased LDL; he has had good control with simvastatin 20 mg Labs previously:   Lab Results  Component Value Date   CHOL 125 07/02/2020   HDL 48.30 07/02/2020   LDLCALC 67 07/02/2020   TRIG 53.0 07/02/2020   CHOLHDL 3 07/02/2020   HYPERTENSION: See review of systems    Allergies as of 05/06/2021   No Known Allergies      Medication List        Accurate as of May 06, 2021  8:42 AM. If you have any questions, ask your nurse or  doctor.          aspirin 81 MG tablet Take 81 mg by mouth daily.   bimatoprost 0.01 % Soln Commonly known as: LUMIGAN 1 drop at bedtime.   brimonidine 0.15 % ophthalmic solution Commonly known as: ALPHAGAN   Combigan 0.2-0.5 % ophthalmic solution Generic drug: brimonidine-timolol   dorzolamide 2 % ophthalmic solution Commonly known as: TRUSOPT   glipiZIDE 2.5 MG 24 hr tablet Commonly known as: GLUCOTROL XL Take 1 tablet (2.5 mg total) by mouth daily with breakfast.   glucose blood test strip 1 each by Other route daily. Use OneTouch Verio test strips as instructed to check blood sugar once daily.   latanoprost 0.005 % ophthalmic solution Commonly known as: XALATAN   losartan 25 MG tablet Commonly known as: COZAAR TAKE 1 TABLET BY MOUTH TWICE A DAY   metFORMIN 1000 MG tablet Commonly known as: GLUCOPHAGE TAKE 1 TABLET BY MOUTH TWICE A DAY   OneTouch Verio Flex System w/Device Kit 1 each by Does not apply route daily. Use OneTouch Verio Flex meter to check blood sugar once daily.   Pfizer-BioNT COVID-19 Vac-TriS Susp injection Generic drug: COVID-19 mRNA Vac-TriS (Pfizer) Inject into the muscle.   prednisoLONE acetate 1 % ophthalmic suspension Commonly known as: PRED FORTE   simvastatin 20 MG  tablet Commonly known as: ZOCOR TAKE 1 TABLET BY MOUTH EVERYDAY AT BEDTIME   timolol 0.5 % ophthalmic solution Commonly known as: TIMOPTIC        Allergies: No Known Allergies  Past Medical History:  Diagnosis Date   Diabetes mellitus without complication ([place])    Erectile dysfunction    Glaucoma    Hyperlipidemia    Hypertension     Past Surgical History:  Procedure Laterality Date   LEG AMPUTATION ABOVE KNEE Bilateral    2001 left leg and 2003 right leg    Family History  Problem Relation Age of Onset   Diabetes Brother    Cancer Neg Hx     Social History:  reports that he has never smoked. He has never used smokeless tobacco. No history on file  for alcohol use and drug use.   ROS  He has regular eye exams from Scott Reynolds  He is followed at least twice a year for his glaucoma No recent reports available    Hypertension:    His blood pressure has been treated with losartan 25 mg twice a day Blood pressure is better today   BP Readings from Last 3 Encounters:  05/06/21 110/72  01/11/21 140/70  07/02/20 (!) 142/82   Lab Results  Component Value Date   CREATININE 0.99 01/11/2021   CREATININE 1.06 07/02/2020   CREATININE 1.07 02/16/2020    He was found to have a low B12 and he is taking 1000 mcg daily as before  Lab Results  Component Value Date   VITAMINB12 1,224 (H) 08/27/2019    He still has not called to establish with a new PCP, has been advised to do so several times and has been given a list of available PCPs  He has had his Covid vaccine, 3 doses  Taking vitamin D OTC, unknown doses   Examination:   BP 110/72    Pulse 98    Ht '5\' 8"'  (1.727 m)    Wt 193 lb 9.6 oz (87.8 kg)    SpO2 99%    BMI 29.44 kg/m   Body mass index is 29.44 kg/m.   Physical Exam       Assesment:   Diabetes type 2 with mild obesity  His diabetes has been well controlled on metformin 1 g twice a day along with glipizide ER 2.5 mg  A1c is still excellent at 5.8  He is taking his medications regularly and has maintained his weight No symptoms of hypoglycemia Again he does not monitor at home and not clear if he may have some postprandial high sugars  Continue same doses of medications as long as renal function is normal today   HYPERTENSION: Blood pressure is normal with losartan and he will continue  Reminded him to start checking at home and needs to get a monitor To call if he has any consistently high readings  LIPIDS: Needs follow-up today  Again given a list of PCPs in the group for establishing as a new patient for general care  He will need to discuss various vaccines and preventive care with his new  PCP, reminded him to get his COVID booster  Follow-up in 6 months     Scott Reynolds 05/06/2021, 8:42 AM

## 2021-05-06 NOTE — Patient Instructions (Signed)
Set up a PCP

## 2021-05-27 ENCOUNTER — Other Ambulatory Visit: Payer: Self-pay

## 2021-05-27 ENCOUNTER — Encounter (INDEPENDENT_AMBULATORY_CARE_PROVIDER_SITE_OTHER): Payer: HMO | Admitting: Ophthalmology

## 2021-05-27 DIAGNOSIS — H43811 Vitreous degeneration, right eye: Secondary | ICD-10-CM

## 2021-05-27 DIAGNOSIS — E113511 Type 2 diabetes mellitus with proliferative diabetic retinopathy with macular edema, right eye: Secondary | ICD-10-CM

## 2021-05-27 DIAGNOSIS — H35031 Hypertensive retinopathy, right eye: Secondary | ICD-10-CM

## 2021-05-27 DIAGNOSIS — I10 Essential (primary) hypertension: Secondary | ICD-10-CM | POA: Diagnosis not present

## 2021-06-03 ENCOUNTER — Encounter (INDEPENDENT_AMBULATORY_CARE_PROVIDER_SITE_OTHER): Payer: Medicare HMO | Admitting: Ophthalmology

## 2021-06-24 ENCOUNTER — Other Ambulatory Visit: Payer: Self-pay | Admitting: Endocrinology

## 2021-07-05 ENCOUNTER — Telehealth: Payer: Self-pay

## 2021-07-05 NOTE — Telephone Encounter (Signed)
Patient called in stating he needs Dr Lucianne Muss to send information over to orthopedic to get new set of prostetic legs. Patient did not have name of Dr or what exactly is needed from Dr Lucianne Muss. He will reach out to orthopedic and give [place] a call back

## 2021-07-14 ENCOUNTER — Other Ambulatory Visit: Payer: Self-pay

## 2021-07-14 ENCOUNTER — Encounter (HOSPITAL_COMMUNITY): Payer: Self-pay | Admitting: Emergency Medicine

## 2021-07-14 ENCOUNTER — Emergency Department (HOSPITAL_COMMUNITY): Payer: HMO

## 2021-07-14 ENCOUNTER — Emergency Department (HOSPITAL_COMMUNITY)
Admission: EM | Admit: 2021-07-14 | Discharge: 2021-07-14 | Disposition: A | Discharge status: Home / Self Care | Payer: HMO | Attending: Emergency Medicine | Admitting: Emergency Medicine

## 2021-07-14 DIAGNOSIS — T792XXA Traumatic secondary and recurrent hemorrhage and seroma, initial encounter: Secondary | ICD-10-CM | POA: Insufficient documentation

## 2021-07-14 DIAGNOSIS — M79605 Pain in left leg: Secondary | ICD-10-CM | POA: Diagnosis present

## 2021-07-14 DIAGNOSIS — Z7982 Long term (current) use of aspirin: Secondary | ICD-10-CM | POA: Diagnosis not present

## 2021-07-14 DIAGNOSIS — L03116 Cellulitis of left lower limb: Secondary | ICD-10-CM | POA: Diagnosis not present

## 2021-07-14 DIAGNOSIS — Z7984 Long term (current) use of oral hypoglycemic drugs: Secondary | ICD-10-CM | POA: Insufficient documentation

## 2021-07-14 LAB — CBC WITH DIFFERENTIAL/PLATELET
Abs Immature Granulocytes: 0.02 10*3/uL (ref 0.00–0.07)
Basophils Absolute: 0 10*3/uL (ref 0.0–0.1)
Basophils Relative: 0 %
Eosinophils Absolute: 0 10*3/uL (ref 0.0–0.5)
Eosinophils Relative: 1 %
HCT: 37.4 % — ABNORMAL LOW (ref 39.0–52.0)
Hemoglobin: 11.8 g/dL — ABNORMAL LOW (ref 13.0–17.0)
Immature Granulocytes: 0 %
Lymphocytes Relative: 14 %
Lymphs Abs: 1 10*3/uL (ref 0.7–4.0)
MCH: 30 pg (ref 26.0–34.0)
MCHC: 31.6 g/dL (ref 30.0–36.0)
MCV: 95.2 fL (ref 80.0–100.0)
Monocytes Absolute: 0.5 10*3/uL (ref 0.1–1.0)
Monocytes Relative: 7 %
Neutro Abs: 5.9 10*3/uL (ref 1.7–7.7)
Neutrophils Relative %: 78 %
Platelets: 214 10*3/uL (ref 150–400)
RBC: 3.93 MIL/uL — ABNORMAL LOW (ref 4.22–5.81)
RDW: 12.3 % (ref 11.5–15.5)
WBC: 7.4 10*3/uL (ref 4.0–10.5)
nRBC: 0 % (ref 0.0–0.2)

## 2021-07-14 LAB — BASIC METABOLIC PANEL
Anion gap: 9 (ref 5–15)
BUN: 21 mg/dL (ref 8–23)
CO2: 19 mmol/L — ABNORMAL LOW (ref 22–32)
Calcium: 8.8 mg/dL — ABNORMAL LOW (ref 8.9–10.3)
Chloride: 112 mmol/L — ABNORMAL HIGH (ref 98–111)
Creatinine, Ser: 0.93 mg/dL (ref 0.61–1.24)
GFR, Estimated: >60 mL/min (ref >60)
Glucose, Bld: 153 mg/dL — ABNORMAL HIGH (ref 70–99)
Potassium: 4 mmol/L (ref 3.5–5.1)
Sodium: 140 mmol/L (ref 135–145)

## 2021-07-14 IMAGING — DX DG KNEE COMPLETE 4+V*L*
4 series · 4 of 4 positions shown · non-contrast
Comparison: None.

CLINICAL DATA: Stump pain for 1 week.  Evaluate for osteomyelitis.

EXAM:
LEFT KNEE - COMPLETE 4+ VIEW

[t knee ap left]
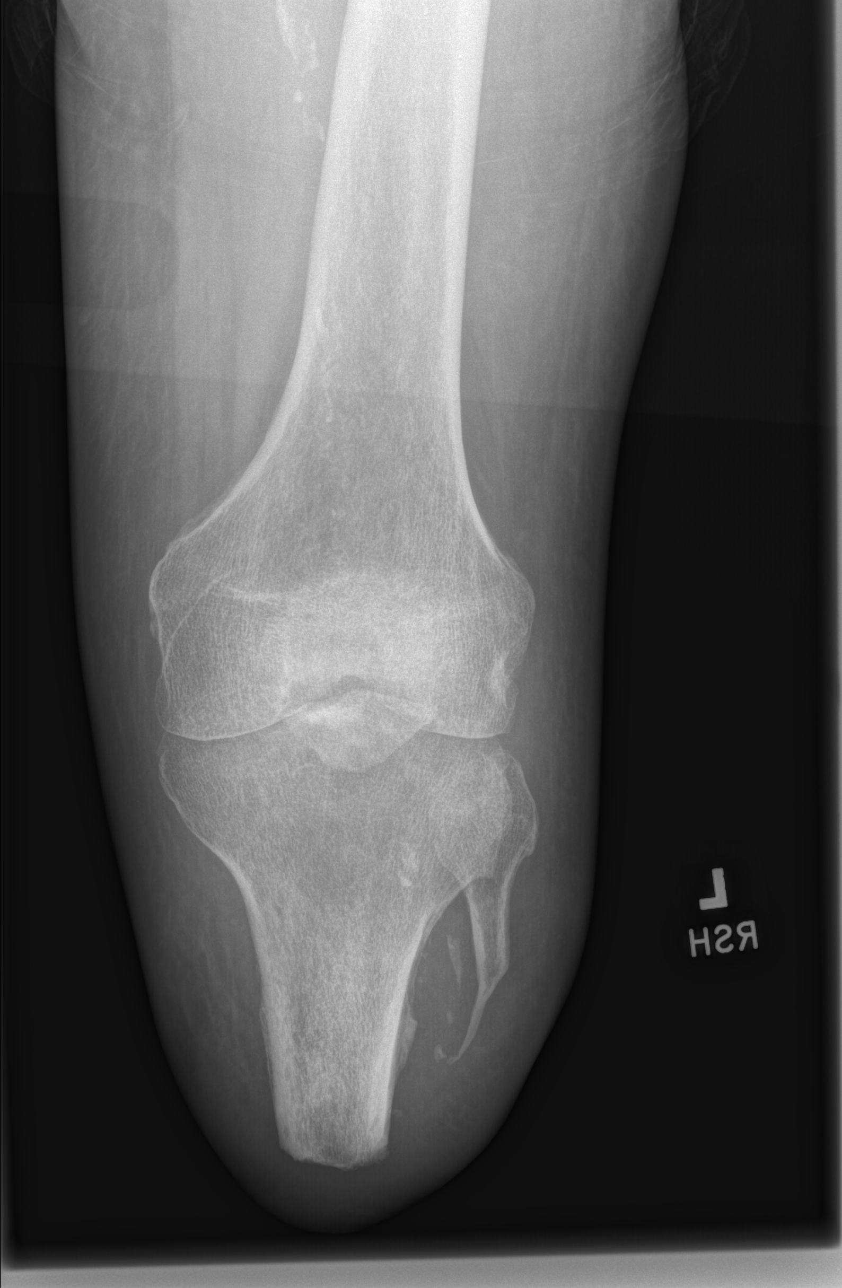

[t knee obl left (1 of 2)]
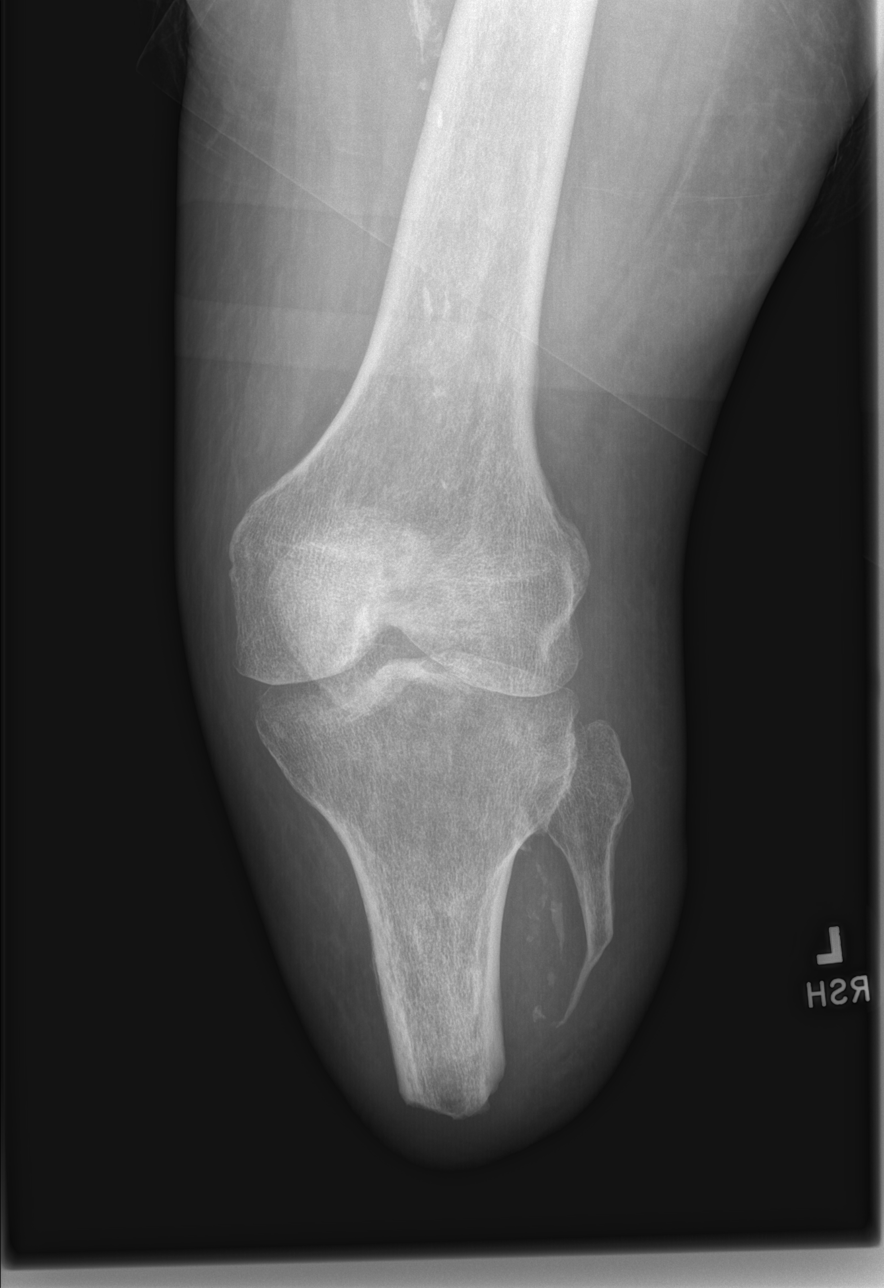

[t knee obl left (2 of 2)]
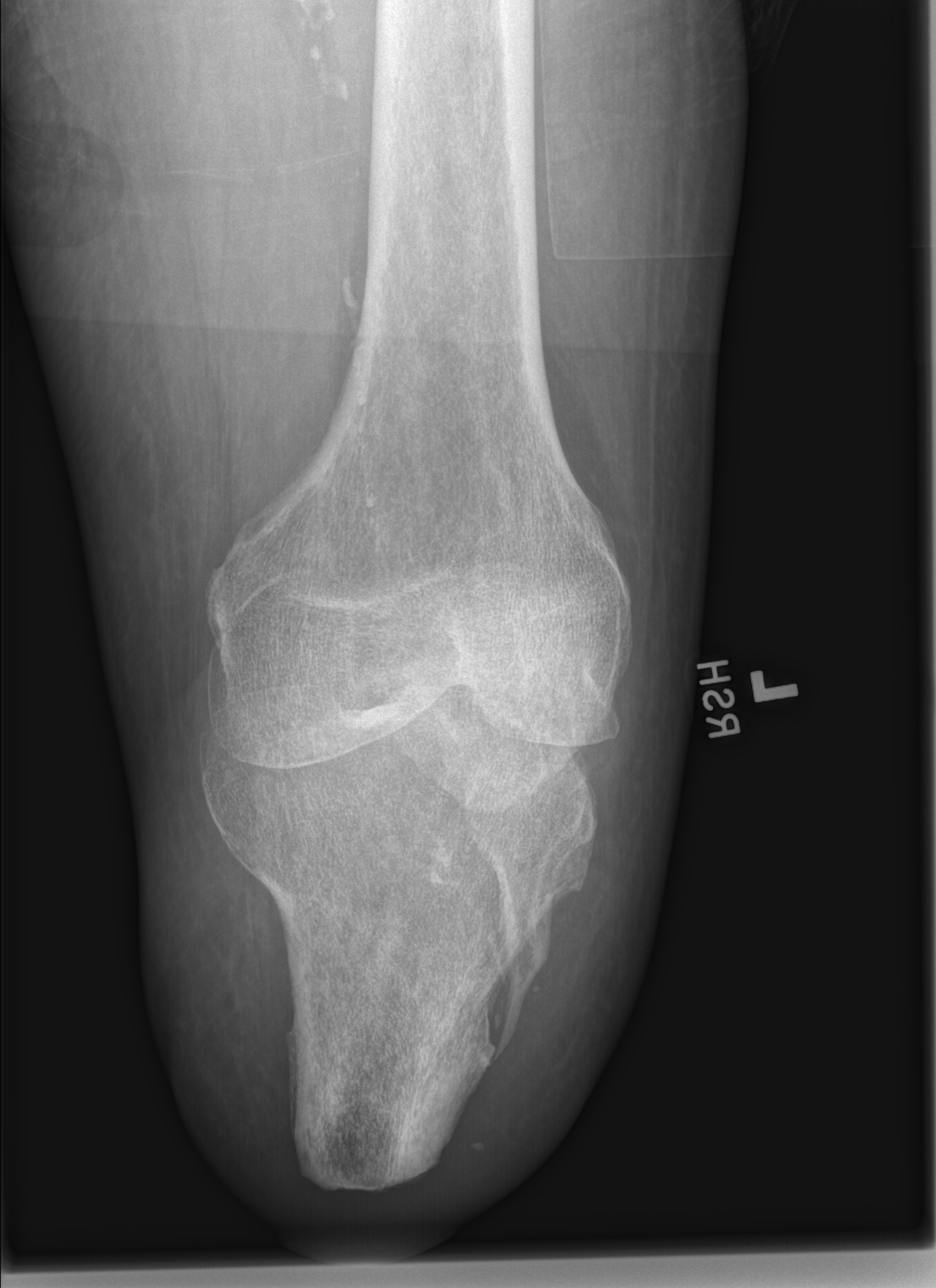

[x knee lat left]
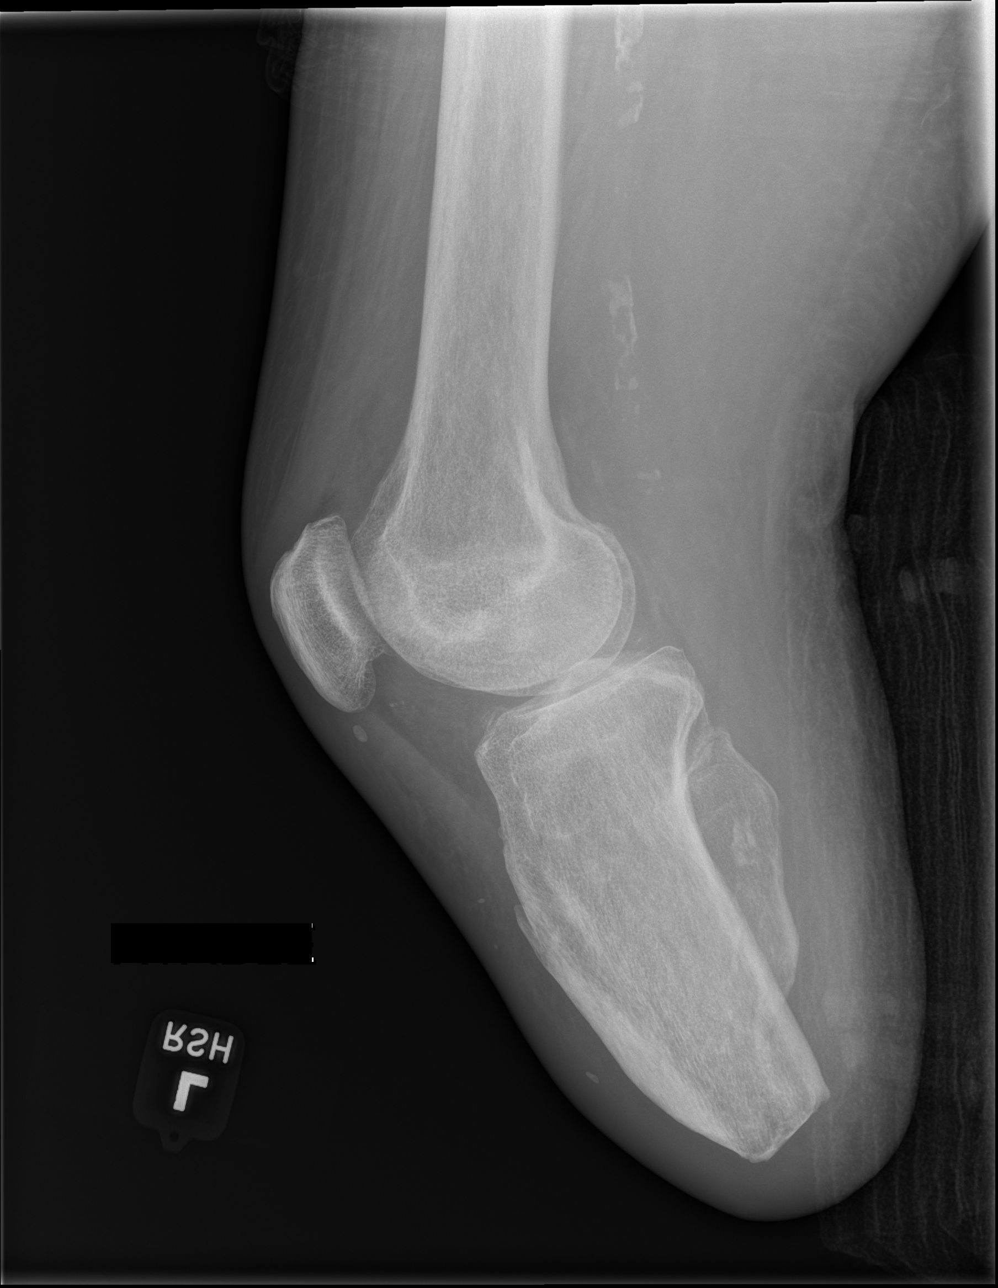

[4 of 4 positions shown; findings below may reference images not displayed]

FINDINGS: Below-the-knee amputation. Vascular calcifications. No osseous
destruction. No soft tissue gas or radiopaque foreign object. No
knee joint effusion.
IMPRESSION: Below-the-knee amputation, without plain film evidence of
osteomyelitis.

## 2021-07-14 IMAGING — MR MR [PERSON_NAME] LOW WO/W CM*L*
4 of 9 series · 11 of 40 positions shown · IV contrast (G G)
Comparison: Radiographs dated [DATE]

CLINICAL DATA: Soft tissue infection suspected. Below-knee
amputation. Rule out osteomyelitis.r

EXAM:
MRI OF LOWER LEFT EXTREMITY WITHOUT AND WITH CONTRAST
TECHNIQUE: Multiplanar, multisequence MR imaging of the left lower extremity
was performed both before and after administration of intravenous
contrast.
CONTRAST:  8mL GADAVIST GADOBUTROL 1 MMOL/ML IV SOLN

[Series 5: T1 · coronal · 6.0mm · 0.39mm/px · 3 of 22 slices shown (1 of 2)]
[im 1/22]
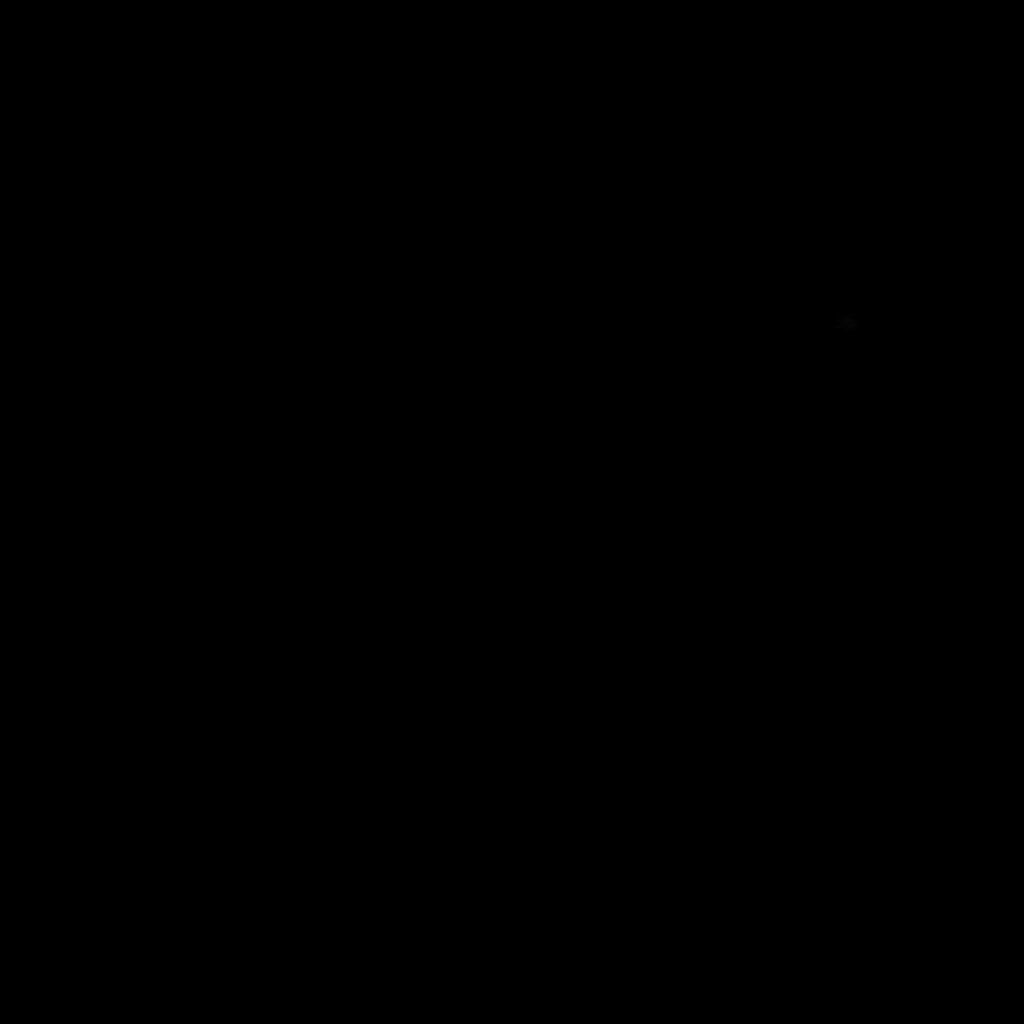
[im 15/22]
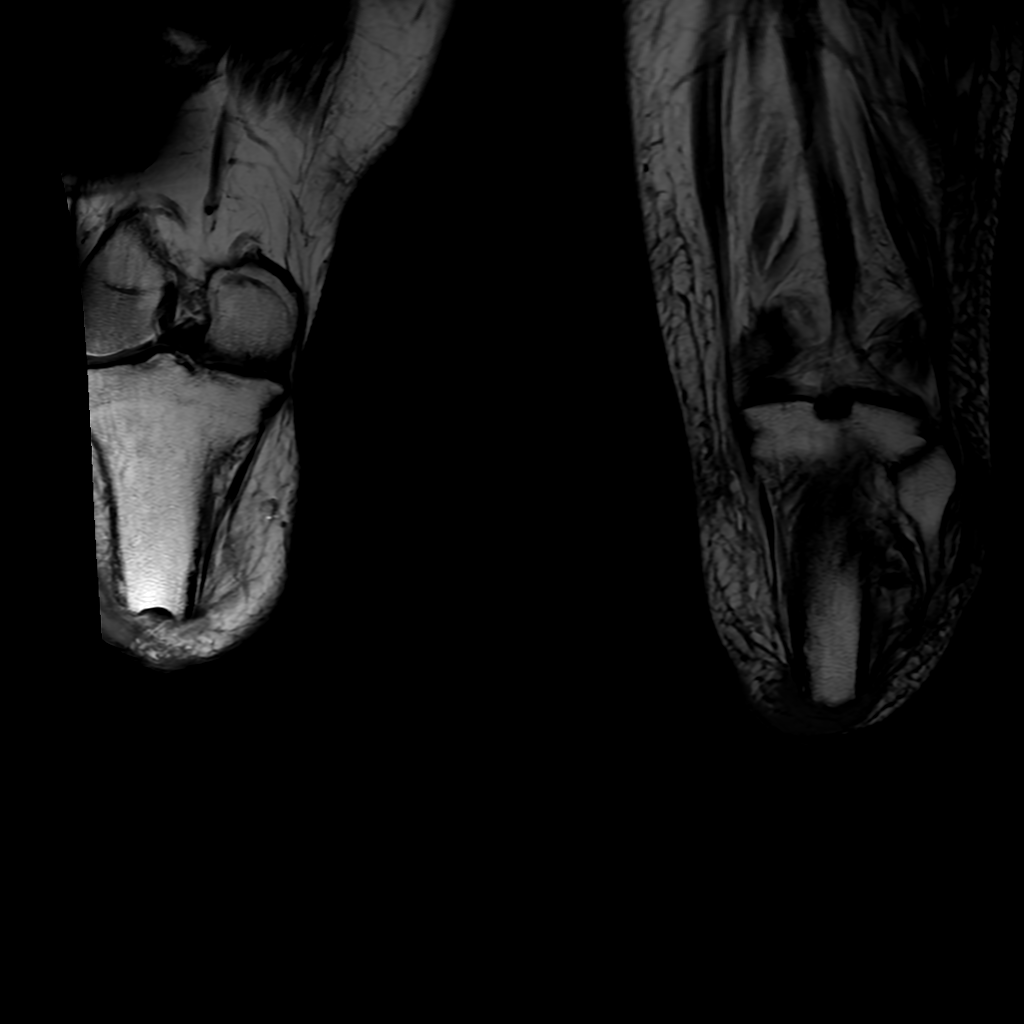
[im 22/22]
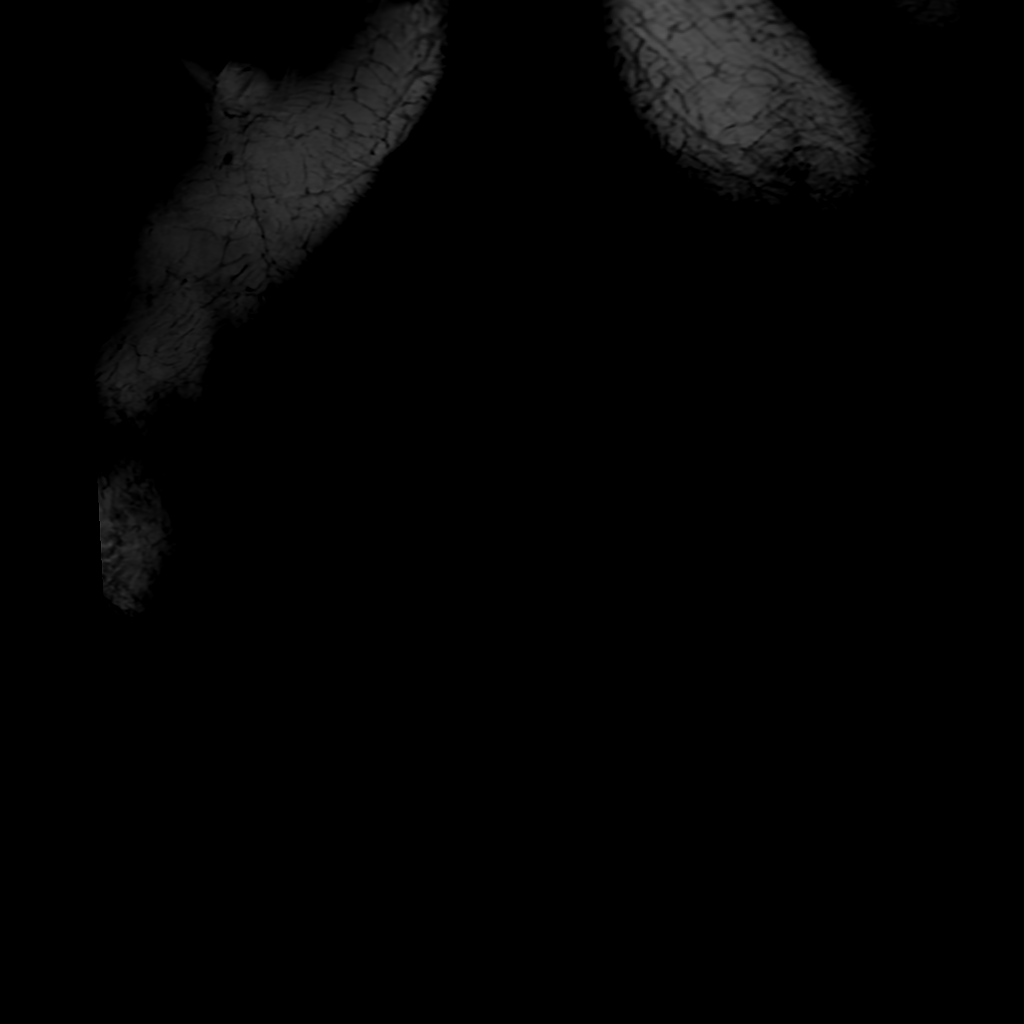

[Series 6: T1 · axial · 5.0mm · 0.51mm/px · z∈[-177,-10]mm · 3 of 36 slices shown (2 of 2)]
[im 8/36]
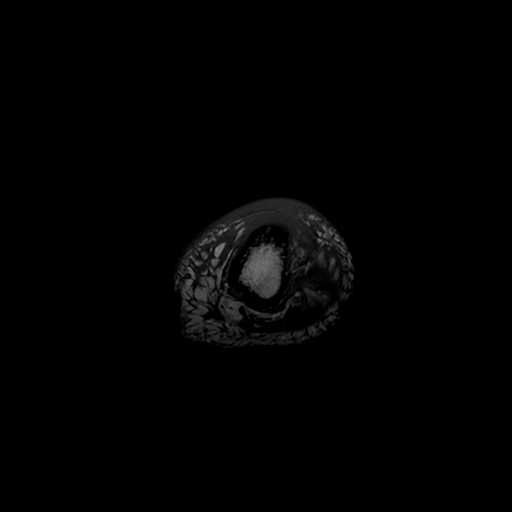
[im 22/36]
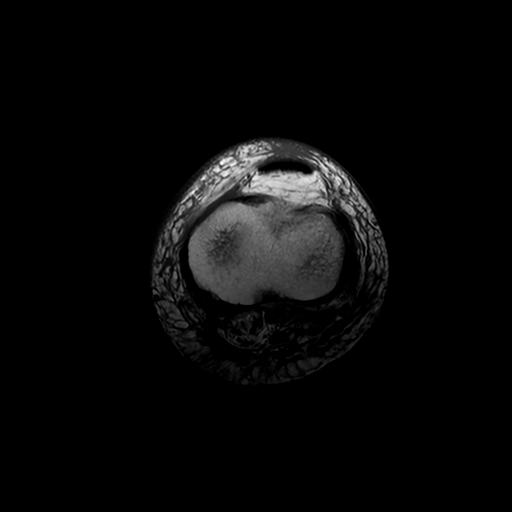
[im 36/36]
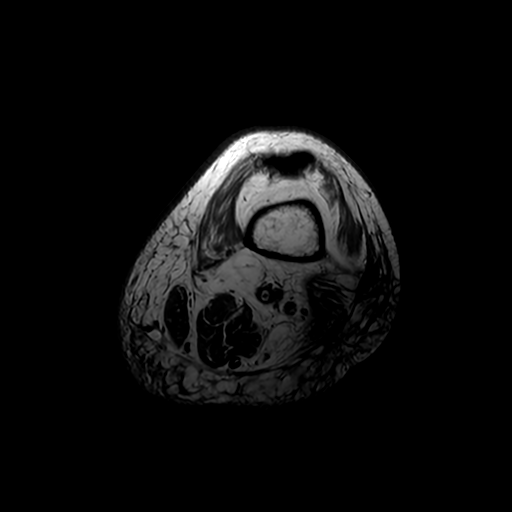

[Series 7: T2 fat-sat · axial · 5.0mm · 0.51mm/px · z∈[-218,-10]mm · 3 of 36 slices shown]
[im 1/36]
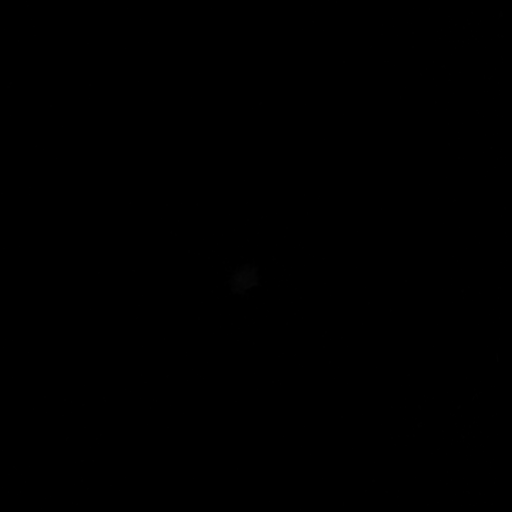
[im 18/36]
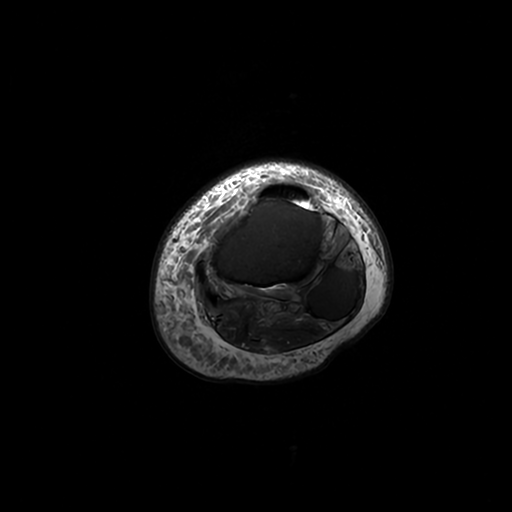
[im 36/36]
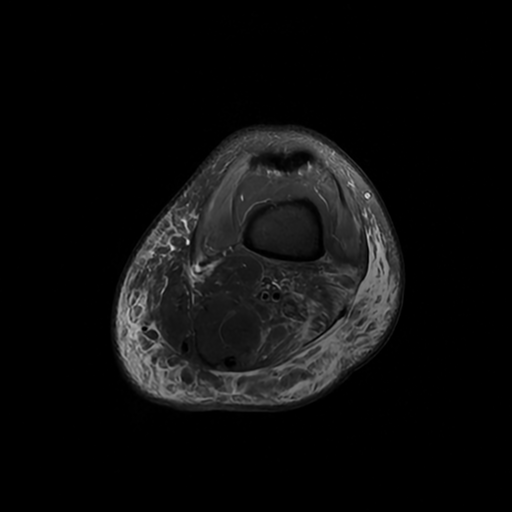

[Series 9: T1 fat-sat · axial · non-contrast · 5.0mm · 0.51mm/px · z∈[-218,-117]mm · 2 of 36 slices shown]
[im 1/36]
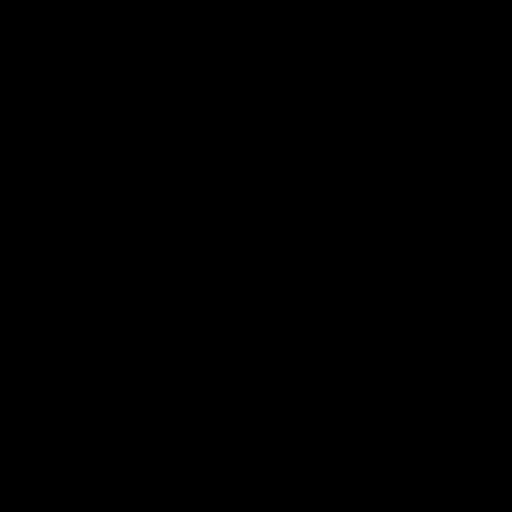
[im 18/36]
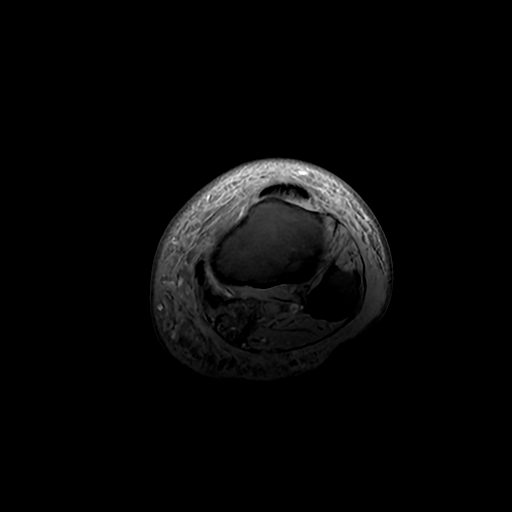

[11 of 40 positions shown; findings below may reference images not displayed]

FINDINGS: Bones/Joint/Cartilage

Status post below-knee amputation. Marrow signal is within normal
limits without evidence of osteomyelitis. Medial and lateral menisci
are intact. No significant joint effusion.

Ligaments

Collateral and cruciate ligaments are intact.

Muscles and Tendons

No intramuscular fluid collection or abscess.

Soft tissues

Marked subcutaneous soft tissue edema and skin thickening. Small
small peripherally enhancing fluid collection measuring
approximately 1.1 x 2.8 x 3.6 cm.
IMPRESSION: 1.  No evidence of osteomyelitis.

2. Marked subcutaneous soft tissue edema and small peripherally
enhancing fluid collection about the amputation site measuring
approximately 1.1 x 2.8 x 3.6 cm, which may represent postoperative
seroma or infectious/inflammatory process. Clinical correlation is
suggested.

## 2021-07-14 MED ORDER — LIDOCAINE-EPINEPHRINE (PF) 2 %-1:200000 IJ SOLN
10.0000 mL | Freq: Once | INTRAMUSCULAR | Status: AC
  Administered 2021-07-14: 10 mL
  Filled 2021-07-14: qty 20

## 2021-07-14 MED ORDER — GADOBUTROL 1 MMOL/ML IV SOLN
8.0000 mL | Freq: Once | INTRAVENOUS | Status: AC | PRN
  Administered 2021-07-14: 8 mL via INTRAVENOUS

## 2021-07-14 MED ORDER — CEPHALEXIN 500 MG PO CAPS: 500.0000 mg | ORAL_CAPSULE | Freq: Four times a day (QID) | ORAL | 0 refills | Status: AC

## 2021-07-14 NOTE — ED Notes (Signed)
Pt in MRI unable to obtain vitals.  

## 2021-07-14 NOTE — Discharge Instructions (Signed)
Continue to soak your leg in Epsom salts.  Wait to use your prostatic until symptoms have completely gone away.  Antibiotic was sent to your pharmacy.  Follow-up with your specialist as planned about your prosthesis.  No sign of bone infection today.

## 2021-07-14 NOTE — ED Triage Notes (Signed)
Per [facility] pt coming from prosthetic doc office c/o left BKA pain. Has some drainage coming from it. BKA has been over 20 years. Patient states it is painful and unable to wear his prosthetic to bear weight and walk.

## 2021-07-14 NOTE — ED Provider Notes (Signed)
[facility] EMERGENCY DEPARTMENT Provider Note   CSN: [id number] Arrival date & time: 07/14/21  1026     History  Chief Complaint  Patient presents with   Leg Pain    BKA     Scott Reynolds is a 81 y.o. male.  The history is provided by the patient.  Leg Pain Location:  Leg Time since incident:  2 weeks Injury: no   Leg location:  L lower leg Pain details:    Quality:  Throbbing and aching   Radiates to:  Does not radiate   Severity:  Moderate   Onset quality:  Gradual   Duration:  2 weeks   Timing:  Constant   Progression:  Worsening Chronicity:  New Associated symptoms: swelling   Associated symptoms comment:  No unable to fit in the prosthetic due to the swelling of the leg.  He soaked it in Epsom salt last night because he had noticed it was red and swollen.  It drained a moderate amount of clear fluid this morning.  He reports in his 24 years he has never had an issue like this.  He is planning on getting a new prosthetic because his other one is worn out but is not aware that it is rubbing on his leg in an unusual way.  He has not had any fever, chills or other systemic symptoms.  He was seen at his specialist today who recommended he come here for evaluation.     Home Medications Prior to Admission medications   Medication Sig Start Date End Date Taking? Authorizing Provider  cephALEXin (KEFLEX) 500 MG capsule Take 1 capsule (500 mg total) by mouth 4 (four) times daily for 7 days. 07/14/21 07/21/21 Yes Blanchie Dessert, MD  aspirin 81 MG tablet Take 81 mg by mouth daily.    [provider]  bimatoprost (LUMIGAN) 0.01 % SOLN 1 drop at bedtime.    [provider]  Blood Glucose Monitoring Suppl ([place]) w/Device KIT 1 each by Does not apply route daily. Use OneTouch Verio Flex meter to check blood sugar once daily.    [provider]  brimonidine (ALPHAGAN) 0.15 % ophthalmic solution  09/18/14   [provider]  COMBIGAN 0.2-0.5 % ophthalmic solution  10/15/12   [provider]  COVID-19 mRNA Vac-TriS, Pfizer, SUSP injection Inject into the muscle. 10/27/20   Carlyle Basques, MD  dorzolamide (TRUSOPT) 2 % ophthalmic solution  01/18/13   [provider]  glipiZIDE (GLUCOTROL XL) 2.5 MG 24 hr tablet Take 1 tablet (2.5 mg total) by mouth daily with breakfast. 07/02/20   Elayne Snare, MD  glucose blood test strip 1 each by Other route daily. Use OneTouch Verio test strips as instructed to check blood sugar once daily. 01/20/19   Elayne Snare, MD  latanoprost (XALATAN) 0.005 % ophthalmic solution  09/18/14   [provider]  losartan (COZAAR) 25 MG tablet TAKE 1 TABLET BY MOUTH TWICE A DAY 04/07/21   Elayne Snare, MD  metFORMIN (GLUCOPHAGE) 1000 MG tablet TAKE 1 TABLET BY MOUTH TWICE A DAY 02/24/21   Elayne Snare, MD  prednisoLONE acetate (PRED FORTE) 1 % ophthalmic suspension  01/05/14   [provider]  simvastatin (ZOCOR) 20 MG tablet TAKE 1 TABLET BY MOUTH EVERYDAY AT BEDTIME 06/24/21   Elayne Snare, MD  timolol (TIMOPTIC) 0.5 % ophthalmic solution  09/18/14   [provider]      Allergies    Patient has  no known allergies.    Review of Systems   Review of Systems  Physical Exam Updated Vital Signs BP (!) 134/58 (BP Location: Left Arm)    Pulse 72    Temp 98.7 F (37.1 C) (Oral)    Resp 16    Ht '5\' 8"'  (1.727 m)    Wt 87.5 kg    SpO2 100%    BMI 29.35 kg/m  Physical Exam Vitals and nursing note reviewed.  Constitutional:      General: He is not in acute distress.    Appearance: Normal appearance.  HENT:     Head: Normocephalic and atraumatic.  Eyes:     Pupils: Pupils are equal, round, and reactive to light.  Cardiovascular:     Rate and Rhythm: Normal rate.  Pulmonary:     Effort: Pulmonary effort is normal. No respiratory distress.     Breath sounds: Normal breath sounds. No wheezing.  Musculoskeletal:        General: Swelling and  tenderness present.     Comments: Bilateral BKA.  Prosthetic in place on the right leg and was not further evaluated.  Left stump with evidence of where there was some drainage in the middle of his suture line.  Mild erythema, warmth and swelling noted to the end of the stump.  No knee involvement  Skin:    General: Skin is warm and dry.  Neurological:     General: No focal deficit present.     Mental Status: He is alert and oriented to person, place, and time. Mental status is at baseline.  Psychiatric:        Mood and Affect: Mood normal.        Behavior: Behavior normal.    ED Results / Procedures / Treatments   Labs (all labs ordered are listed, but only abnormal results are displayed) Labs Reviewed  BASIC METABOLIC PANEL - Abnormal; Notable for the following components:      Result Value   Chloride 112 (*)    CO2 19 (*)    Glucose, Bld 153 (*)    Calcium 8.8 (*)    All other components within normal limits  CBC WITH DIFFERENTIAL/PLATELET - Abnormal; Notable for the following components:   RBC 3.93 (*)    Hemoglobin 11.8 (*)    HCT 37.4 (*)    All other components within normal limits    EKG None  Radiology MR TIBIA FIBULA LEFT W WO CONTRAST  Result Date: 07/14/2021 CLINICAL DATA:  Soft tissue infection suspected. Below-knee amputation. Rule out osteomyelitis.r EXAM: MRI OF LOWER LEFT EXTREMITY WITHOUT AND WITH CONTRAST TECHNIQUE: Multiplanar, multisequence MR imaging of the left lower extremity was performed both before and after administration of intravenous contrast. CONTRAST:  33m GADAVIST GADOBUTROL 1 MMOL/ML IV SOLN COMPARISON:  Radiographs dated July 14, 2021 FINDINGS: Bones/Joint/Cartilage Status post below-knee amputation. Marrow signal is within normal limits without evidence of osteomyelitis. Medial and lateral menisci are intact. No significant joint effusion. Ligaments Collateral and cruciate ligaments are intact. Muscles and Tendons No intramuscular fluid  collection or abscess. Soft tissues Marked subcutaneous soft tissue edema and skin thickening. Small small peripherally enhancing fluid collection measuring approximately 1.1 x 2.8 x 3.6 cm. IMPRESSION: 1.  No evidence of osteomyelitis. 2. Marked subcutaneous soft tissue edema and small peripherally enhancing fluid collection about the amputation site measuring approximately 1.1 x 2.8 x 3.6 cm, which may represent postoperative seroma or infectious/inflammatory process. Clinical correlation is suggested. Electronically Signed  By: Keane Police D.O.   On: 07/14/2021 14:41   DG Knee Complete 4 Views Left  Result Date: 07/14/2021 CLINICAL DATA:  Stump pain for 1 week.  Evaluate for osteomyelitis. EXAM: LEFT KNEE - COMPLETE 4+ VIEW COMPARISON:  None. FINDINGS: Below-the-knee amputation. Vascular calcifications. No osseous destruction. No soft tissue gas or radiopaque foreign object. No knee joint effusion. IMPRESSION: Below-the-knee amputation, without plain film evidence of osteomyelitis. Electronically Signed   By: Abigail Miyamoto M.D.   On: 07/14/2021 11:17    Procedures Procedures    Medications Ordered in ED Medications  gadobutrol (GADAVIST) 1 MMOL/ML injection 8 mL (8 mLs Intravenous Contrast Given 07/14/21 1415)  lidocaine-EPINEPHrine (XYLOCAINE W/EPI) 2 %-1:200000 (PF) injection 10 mL (10 mLs Infiltration Given 07/14/21 1503)    ED Course/ Medical Decision Making/ A&P                           Medical Decision Making Amount and/or Complexity of Data Reviewed External Data Reviewed: notes. Labs: ordered. Decision-making details documented in ED Course. Radiology: ordered and independent interpretation performed. Decision-making details documented in ED Course.  Risk Prescription drug management.   Patient is an elderly male with bilateral BKA's for over 20 years who wears prosthetics and is presenting today due to concern for a stump infection.  He has had swelling for the last 2 weeks  and reported a clear drainage today.  Possibility for seroma from trauma from a poorly fitted prosthetic versus infection with abscess or even possible osteomyelitis.  Patient does not appear systemically ill at this time.  I independently interpreted patient's labs and his BMP is at baseline with normal creatinine and CBC with normal white count of 7.4 today.  I independently reviewed and interpreted patient's x-ray which appears normal.  Radiology did not notice any signs of osteomyelitis however we will do an MRI for further evaluation as pt is diabetic and high risk.  3:21 PM MRI was negative for osteomyelitis did show a fluid collection at the end of the stump which could be inflammatory or infectious.  An 18-gauge needle was inserted in the end of the stump with approximately 2 mL of serous fluid removed but no evidence of purulent drainage.  Patient will be placed on Keflex for possibility for cellulitis.  No evidence of abscess at this time.  Low suspicion for MRSA.  Patient was also instructed to continue using soaks and avoid using his prosthetic as this may have been the cause of his seroma.  No social determinants affecting his care.  He has both crutches, walker and a wheelchair at home if needed         Final Clinical Impression(s) / ED Diagnoses Final diagnoses:  Cellulitis of left lower extremity  Traumatic seroma of lower leg, initial encounter ([place])    Rx / DC Orders ED Discharge Orders          Ordered    cephALEXin (KEFLEX) 500 MG capsule  4 times daily        07/14/21 1520              Blanchie Dessert, MD 07/14/21 1523

## 2021-07-14 NOTE — ED Provider Triage Note (Signed)
Emergency Medicine Provider Triage Evaluation Note  Scott Reynolds , a 81 y.o. male  was evaluated in triage.  Pt complains of pain to left BKA stump. States that pain has been intermittent x1 week. He states he went to his doctor this morning and had clear fluid drain from it. He states the area feels sore just over the stump. Denies fevers, nausea, appetite change. Denies purulent drainage or any previous complications with stump. Denies falls.   Review of Systems  Positive: See above Negative:   Physical Exam  BP (!) 150/64 (BP Location: Right Arm)    Pulse 77    Temp 98.7 F (37.1 C) (Oral)    Resp 18    Ht 5\' 8"  (1.727 m)    Wt 87.5 kg    SpO2 99%    BMI 29.35 kg/m  Gen:   Awake, no distress   Resp:  Normal effort  MSK:   Moves extremities without difficulty  Other:  Left BKA appears somewhat red over the tibial plateau. Warm. There is no obvious wounds or drainage from the area. Knee appears normal without redness, warmth or tenderness.   Medical Decision Making  Medically screening exam initiated at 10:43 AM.  Appropriate orders placed.  Scott Reynolds was informed that the remainder of the evaluation will be completed by another provider, this initial triage assessment does not replace that evaluation, and the importance of remaining in the ED until their evaluation is complete.     Hayes Ludwig, PA-C 07/14/21 1046

## 2021-07-15 ENCOUNTER — Telehealth: Payer: Self-pay | Admitting: Endocrinology

## 2021-07-15 NOTE — Telephone Encounter (Signed)
Patient dropped paperwork for [facility] for prosthetic - placed in provider box

## 2021-07-25 NOTE — Telephone Encounter (Signed)
Patient called back. States he doesn't have a pcp and has not had surgery in over 24 years. ?

## 2021-07-25 NOTE — Telephone Encounter (Signed)
Attempted to call patient to inform him of Kumar's previous message but no answer. Vm left to call back ?

## 2021-07-26 NOTE — Telephone Encounter (Signed)
Called patient to let him know that he needs to schedule to see PCP. He will call me back to get a few numbers for PCP's so he may call and try to schedule appt. ?

## 2021-08-15 ENCOUNTER — Ambulatory Visit (INDEPENDENT_AMBULATORY_CARE_PROVIDER_SITE_OTHER): Payer: HMO | Admitting: Internal Medicine

## 2021-08-15 ENCOUNTER — Encounter: Payer: Self-pay | Admitting: Internal Medicine

## 2021-08-15 VITALS — BP 145/60 | HR 82 | Temp 98.3°F | Ht 68.0 in | Wt 205.8 lb

## 2021-08-15 DIAGNOSIS — E119 Type 2 diabetes mellitus without complications: Secondary | ICD-10-CM | POA: Diagnosis not present

## 2021-08-15 DIAGNOSIS — D649 Anemia, unspecified: Secondary | ICD-10-CM

## 2021-08-15 DIAGNOSIS — E7800 Pure hypercholesterolemia, unspecified: Secondary | ICD-10-CM

## 2021-08-15 DIAGNOSIS — Z89512 Acquired absence of left leg below knee: Secondary | ICD-10-CM

## 2021-08-15 DIAGNOSIS — I10 Essential (primary) hypertension: Secondary | ICD-10-CM | POA: Diagnosis not present

## 2021-08-15 DIAGNOSIS — H409 Unspecified glaucoma: Secondary | ICD-10-CM

## 2021-08-15 DIAGNOSIS — Z89612 Acquired absence of left leg above knee: Secondary | ICD-10-CM

## 2021-08-15 DIAGNOSIS — Z89511 Acquired absence of right leg below knee: Secondary | ICD-10-CM

## 2021-08-15 DIAGNOSIS — Z0000 Encounter for general adult medical examination without abnormal findings: Secondary | ICD-10-CM

## 2021-08-15 DIAGNOSIS — Z89611 Acquired absence of right leg above knee: Secondary | ICD-10-CM

## 2021-08-15 LAB — GLUCOSE, CAPILLARY: Glucose-Capillary: 106 mg/dL — ABNORMAL HIGH (ref 70–99)

## 2021-08-15 LAB — POCT GLYCOSYLATED HEMOGLOBIN (HGB A1C): Hemoglobin A1C: 5.5 % (ref 4.0–5.6)

## 2021-08-15 NOTE — Assessment & Plan Note (Signed)
Compliant with medication. Asymptomatic today. ?Recent LDL 52. Well controlled on current regimen.  ?Continue statin 20mg .  ?

## 2021-08-15 NOTE — Progress Notes (Signed)
? ?  CC: establish care ? ?HPI:Mr.Scott Reynolds is a 81 y.o. male who presents for evaluation of prosthetic legs. Please see individual problem based A/P for details. ? ? ?Depression, PHQ-9: ?Based on the patients  score we have . ? ?Past Medical History:  ?Diagnosis Date  ? Diabetes mellitus without complication ([place])   ? Erectile dysfunction   ? Glaucoma   ? Hyperlipidemia   ? Hypertension   ? ?Review of Systems:   ?Review of Systems  ?Constitutional: Negative.   ?HENT: Negative.    ?Eyes: Negative.   ?Cardiovascular: Negative.   ?Gastrointestinal: Negative.   ?Genitourinary: Negative.   ?Musculoskeletal: Negative.   ?Skin: Negative.   ?Neurological: Negative.   ?Psychiatric/Behavioral: Negative.     ? ?Physical Exam: ?There were no vitals filed for this visit. ? ? ?General: AAOX3, NAD ?HEENT: Conjunctiva nl , antiicteric sclerae, moist mucous membranes, no exudate or erythema ?Cardiovascular: Normal rate, regular rhythm.  No murmurs, rubs, or gallops ?Pulmonary : Equal breath sounds, No wheezes, rales, or rhonchi ?Abdominal: soft, nontender,  bowel sounds present ?Ext: Bilateral BKA, No edema in lower extremities, no tenderness to palpation of lower extremities.  ? ?Assessment & Plan:  ? ?See Encounters Tab for problem based charting. ? ?Patient discussed with Dr. Dareen Piano ? ?

## 2021-08-15 NOTE — Assessment & Plan Note (Signed)
Prevnar 20 administered

## 2021-08-15 NOTE — Patient Instructions (Addendum)
Dear Scott Reynolds, ? ?Thank you for trusting [place] with your care today. ? ?We discussed your blood pressure, cholesterol, diabetes, bilateral knee amputation, glaucoma, and health maintenance.  ? ?For your blood pressure, we will continue you on the losartan 25 twice daily. ?For your cholesterol, please continue taking the Simvistatin.  ?For your diabetes, we will recheck an A1c. Depending on those results, we will discuss with your endocrinologist to see if we need to change any of your medications. ?We will order new prosthetic legs for you today.  ?Please call Dr. Gertie Exon office and have them send records to [place].  ?We will update your pneumonia shot today. ? ?Please return to the clinic in 3 months for a follow up. ?

## 2021-08-15 NOTE — Assessment & Plan Note (Signed)
Reports compliance with medications. Asymptomatic today.  ?BP elevated 153/59 and 145/60 on recheck.  ?Appears to have been controlled previously. Will continue losartan 25 BID for now. If BP remains uncontrolled at f/u visit, can consider initiation of hctz. ?

## 2021-08-15 NOTE — Assessment & Plan Note (Addendum)
Follows with Dr. Dwyane Dee [facility]. ?Has meter but does not check at home. Denies hypoglycemic episodes. No symptoms today.  ?Well controlled on glipizide and metformin. A1c checked 5.5. Has been well controlled previously on this regimen. Will discuss with Dr. Dwyane Dee if glipizide can be discontinued.  ?

## 2021-08-15 NOTE — Assessment & Plan Note (Signed)
Iron panel 2020 was normal. Had low B12 at that time and has been taking supplement. Not currently taking supplementation. Iron panel was also checked and wnl. Folate not checked. ?He denies blood in stool and no reported dark tarry stools. Asymptomatic today.  ? ?

## 2021-08-15 NOTE — Assessment & Plan Note (Signed)
Reports he has been using same prosthetic legs for approximately last 20 years. Reports prosthetics no longer fitting well and causing problems. Recent ED visit for left stump pain thought due to seroma. Legs are improved today after seroma was drained and given abx course. ?No edema pain or tenderness in legs today. ?Old prosthetics from [employer]. DME order for prosthetics placed.  ?  ?

## 2021-08-16 ENCOUNTER — Telehealth: Payer: Self-pay

## 2021-08-16 NOTE — Telephone Encounter (Signed)
I faxed information to [facility] for this patient.The office number is [phone] fax number is [phone] for new Prosthetics and supplies [place], [place] C3/28/20232:31 PM ? ?

## 2021-08-17 NOTE — Progress Notes (Signed)
Internal Medicine Clinic Attending ° °Case discussed with Dr. Gawaluck  At the time of the visit.  We reviewed the resident’s history and exam and pertinent patient test results.  I agree with the assessment, diagnosis, and plan of care documented in the resident’s note.  °

## 2021-08-18 NOTE — Addendum Note (Signed)
Addended by: Adron Bene T on: 08/18/2021 03:28 PM ? ? Modules accepted: Level of Service ? ?

## 2021-08-24 ENCOUNTER — Other Ambulatory Visit: Payer: Self-pay | Admitting: Endocrinology

## 2021-09-05 ENCOUNTER — Encounter (INDEPENDENT_AMBULATORY_CARE_PROVIDER_SITE_OTHER): Payer: HMO | Admitting: Ophthalmology

## 2021-09-05 DIAGNOSIS — H43811 Vitreous degeneration, right eye: Secondary | ICD-10-CM | POA: Diagnosis not present

## 2021-09-05 DIAGNOSIS — I10 Essential (primary) hypertension: Secondary | ICD-10-CM | POA: Diagnosis not present

## 2021-09-05 DIAGNOSIS — H35031 Hypertensive retinopathy, right eye: Secondary | ICD-10-CM | POA: Diagnosis not present

## 2021-09-05 DIAGNOSIS — E113311 Type 2 diabetes mellitus with moderate nonproliferative diabetic retinopathy with macular edema, right eye: Secondary | ICD-10-CM

## 2021-09-08 ENCOUNTER — Other Ambulatory Visit: Payer: Self-pay

## 2021-09-08 ENCOUNTER — Encounter (HOSPITAL_COMMUNITY): Payer: Self-pay

## 2021-09-08 ENCOUNTER — Emergency Department (HOSPITAL_COMMUNITY): Payer: HMO

## 2021-09-08 ENCOUNTER — Inpatient Hospital Stay (HOSPITAL_COMMUNITY)
Admission: EM | Admit: 2021-09-08 | Discharge: 2021-09-13 | DRG: 602 | Disposition: A | Discharge status: Skilled Nursing Facility | Payer: HMO | Attending: Internal Medicine | Admitting: Internal Medicine

## 2021-09-08 DIAGNOSIS — E7800 Pure hypercholesterolemia, unspecified: Secondary | ICD-10-CM | POA: Diagnosis present

## 2021-09-08 DIAGNOSIS — E876 Hypokalemia: Secondary | ICD-10-CM | POA: Diagnosis present

## 2021-09-08 DIAGNOSIS — H409 Unspecified glaucoma: Secondary | ICD-10-CM | POA: Diagnosis present

## 2021-09-08 DIAGNOSIS — Z89511 Acquired absence of right leg below knee: Secondary | ICD-10-CM

## 2021-09-08 DIAGNOSIS — K5900 Constipation, unspecified: Secondary | ICD-10-CM | POA: Diagnosis present

## 2021-09-08 DIAGNOSIS — Z79899 Other long term (current) drug therapy: Secondary | ICD-10-CM | POA: Diagnosis not present

## 2021-09-08 DIAGNOSIS — R7989 Other specified abnormal findings of blood chemistry: Secondary | ICD-10-CM

## 2021-09-08 DIAGNOSIS — Z7401 Bed confinement status: Secondary | ICD-10-CM

## 2021-09-08 DIAGNOSIS — L89112 Pressure ulcer of right upper back, stage 2: Secondary | ICD-10-CM | POA: Diagnosis present

## 2021-09-08 DIAGNOSIS — G9389 Other specified disorders of brain: Secondary | ICD-10-CM | POA: Diagnosis present

## 2021-09-08 DIAGNOSIS — L03311 Cellulitis of abdominal wall: Secondary | ICD-10-CM | POA: Diagnosis present

## 2021-09-08 DIAGNOSIS — L89122 Pressure ulcer of left upper back, stage 2: Secondary | ICD-10-CM | POA: Diagnosis present

## 2021-09-08 DIAGNOSIS — I10 Essential (primary) hypertension: Secondary | ICD-10-CM | POA: Diagnosis present

## 2021-09-08 DIAGNOSIS — Z515 Encounter for palliative care: Secondary | ICD-10-CM | POA: Diagnosis not present

## 2021-09-08 DIAGNOSIS — Z7984 Long term (current) use of oral hypoglycemic drugs: Secondary | ICD-10-CM | POA: Diagnosis not present

## 2021-09-08 DIAGNOSIS — Z7982 Long term (current) use of aspirin: Secondary | ICD-10-CM

## 2021-09-08 DIAGNOSIS — Z833 Family history of diabetes mellitus: Secondary | ICD-10-CM | POA: Diagnosis not present

## 2021-09-08 DIAGNOSIS — Z20822 Contact with and (suspected) exposure to covid-19: Secondary | ICD-10-CM | POA: Diagnosis present

## 2021-09-08 DIAGNOSIS — G9341 Metabolic encephalopathy: Secondary | ICD-10-CM | POA: Diagnosis present

## 2021-09-08 DIAGNOSIS — L89892 Pressure ulcer of other site, stage 2: Secondary | ICD-10-CM | POA: Diagnosis present

## 2021-09-08 DIAGNOSIS — L0390 Cellulitis, unspecified: Secondary | ICD-10-CM | POA: Diagnosis present

## 2021-09-08 DIAGNOSIS — L03317 Cellulitis of buttock: Principal | ICD-10-CM | POA: Diagnosis present

## 2021-09-08 DIAGNOSIS — E119 Type 2 diabetes mellitus without complications: Secondary | ICD-10-CM | POA: Diagnosis present

## 2021-09-08 DIAGNOSIS — L03818 Cellulitis of other sites: Secondary | ICD-10-CM

## 2021-09-08 DIAGNOSIS — E1139 Type 2 diabetes mellitus with other diabetic ophthalmic complication: Secondary | ICD-10-CM | POA: Diagnosis present

## 2021-09-08 DIAGNOSIS — Z7189 Other specified counseling: Secondary | ICD-10-CM | POA: Diagnosis not present

## 2021-09-08 DIAGNOSIS — D649 Anemia, unspecified: Secondary | ICD-10-CM | POA: Diagnosis present

## 2021-09-08 DIAGNOSIS — Z89512 Acquired absence of left leg below knee: Secondary | ICD-10-CM

## 2021-09-08 DIAGNOSIS — Z89519 Acquired absence of unspecified leg below knee: Secondary | ICD-10-CM

## 2021-09-08 LAB — COMPREHENSIVE METABOLIC PANEL
ALT: 69 U/L — ABNORMAL HIGH (ref 0–44)
AST: 95 U/L — ABNORMAL HIGH (ref 15–41)
Albumin: 3.2 g/dL — ABNORMAL LOW (ref 3.5–5.0)
Alkaline Phosphatase: 65 U/L (ref 38–126)
Anion gap: 8 (ref 5–15)
BUN: 23 mg/dL (ref 8–23)
CO2: 28 mmol/L (ref 22–32)
Calcium: 8.9 mg/dL (ref 8.9–10.3)
Chloride: 104 mmol/L (ref 98–111)
Creatinine, Ser: 0.94 mg/dL (ref 0.61–1.24)
GFR, Estimated: >60 mL/min (ref >60)
Glucose, Bld: 173 mg/dL — ABNORMAL HIGH (ref 70–99)
Potassium: 3 mmol/L — ABNORMAL LOW (ref 3.5–5.1)
Sodium: 140 mmol/L (ref 135–145)
Total Bilirubin: 1.3 mg/dL — ABNORMAL HIGH (ref 0.3–1.2)
Total Protein: 7.2 g/dL (ref 6.5–8.1)

## 2021-09-08 LAB — CBC WITH DIFFERENTIAL/PLATELET
Abs Immature Granulocytes: 0.02 10*3/uL (ref 0.00–0.07)
Basophils Absolute: 0 10*3/uL (ref 0.0–0.1)
Basophils Relative: 0 %
Eosinophils Absolute: 0.1 10*3/uL (ref 0.0–0.5)
Eosinophils Relative: 1 %
HCT: 38.5 % — ABNORMAL LOW (ref 39.0–52.0)
Hemoglobin: 12.6 g/dL — ABNORMAL LOW (ref 13.0–17.0)
Immature Granulocytes: 0 %
Lymphocytes Relative: 11 %
Lymphs Abs: 0.9 10*3/uL (ref 0.7–4.0)
MCH: 30 pg (ref 26.0–34.0)
MCHC: 32.7 g/dL (ref 30.0–36.0)
MCV: 91.7 fL (ref 80.0–100.0)
Monocytes Absolute: 0.6 10*3/uL (ref 0.1–1.0)
Monocytes Relative: 7 %
Neutro Abs: 7 10*3/uL (ref 1.7–7.7)
Neutrophils Relative %: 81 %
Platelets: 231 10*3/uL (ref 150–400)
RBC: 4.2 MIL/uL — ABNORMAL LOW (ref 4.22–5.81)
RDW: 12.6 % (ref 11.5–15.5)
WBC: 8.6 10*3/uL (ref 4.0–10.5)
nRBC: 0 % (ref 0.0–0.2)

## 2021-09-08 LAB — I-STAT CHEM 8, ED
BUN: 22 mg/dL (ref 8–23)
Calcium, Ion: 1.16 mmol/L (ref 1.15–1.40)
Chloride: 99 mmol/L (ref 98–111)
Creatinine, Ser: 1 mg/dL (ref 0.61–1.24)
Glucose, Bld: 169 mg/dL — ABNORMAL HIGH (ref 70–99)
HCT: 39 % (ref 39.0–52.0)
Hemoglobin: 13.3 g/dL (ref 13.0–17.0)
Potassium: 3.1 mmol/L — ABNORMAL LOW (ref 3.5–5.1)
Sodium: 139 mmol/L (ref 135–145)
TCO2: 28 mmol/L (ref 22–32)

## 2021-09-08 LAB — RESP PANEL BY RT-PCR (FLU A&B, COVID) ARPGX2
Influenza A by PCR: NEGATIVE
Influenza B by PCR: NEGATIVE
SARS Coronavirus 2 by RT PCR: NEGATIVE

## 2021-09-08 LAB — PROTIME-INR
INR: 1.1 (ref 0.8–1.2)
Prothrombin Time: 14.5 seconds (ref 11.4–15.2)

## 2021-09-08 LAB — APTT: aPTT: 29 seconds (ref 24–36)

## 2021-09-08 LAB — LACTIC ACID, PLASMA: Lactic Acid, Venous: 1.5 mmol/L (ref 0.5–1.9)

## 2021-09-08 IMAGING — DX DG CHEST 1V PORT
1 series · 1 of 1 positions shown · non-contrast
Comparison: None.

CLINICAL DATA: Questionable sepsis

EXAM:
PORTABLE CHEST 1 VIEW

[chest ap]
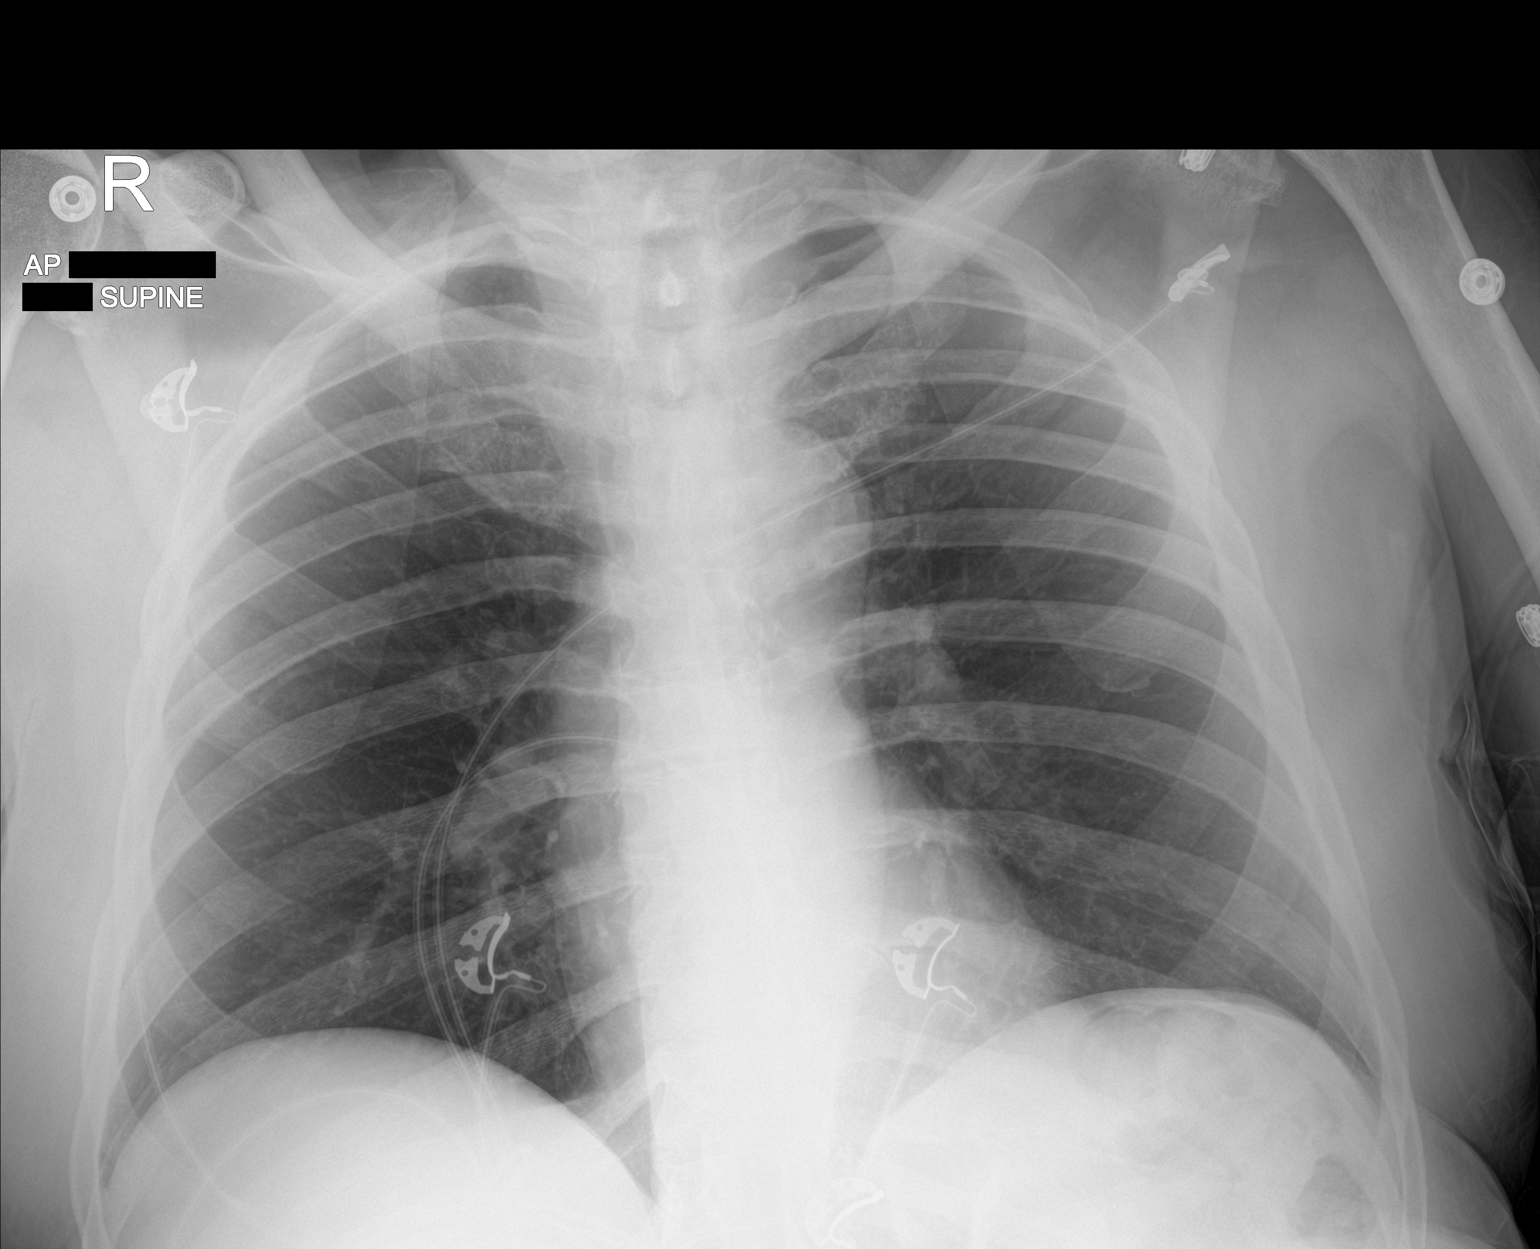

[1 of 1 positions shown; findings below may reference images not displayed]

FINDINGS: Normal mediastinum and cardiac silhouette. Normal pulmonary
vasculature. No evidence of effusion, infiltrate, or pneumothorax.
No acute bony abnormality.
IMPRESSION: No acute cardiopulmonary process.

## 2021-09-08 IMAGING — CT CT HEAD W/O CM
3 series · 14 of 47 positions shown, 16 images · non-contrast
Comparison: None.

CLINICAL DATA: Mental status change, unknown cause



[Series 4: head wo · axial · 0.47mm/px · z∈[-107,+18]mm · 8 of 31 slices shown, 10 images]
[im 3/31  brain]
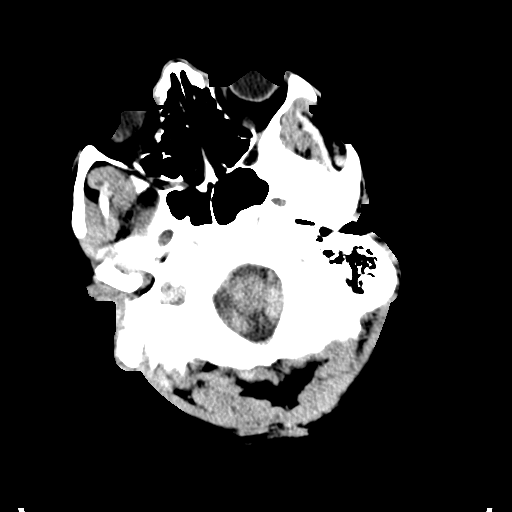
[im 3/31  bone]
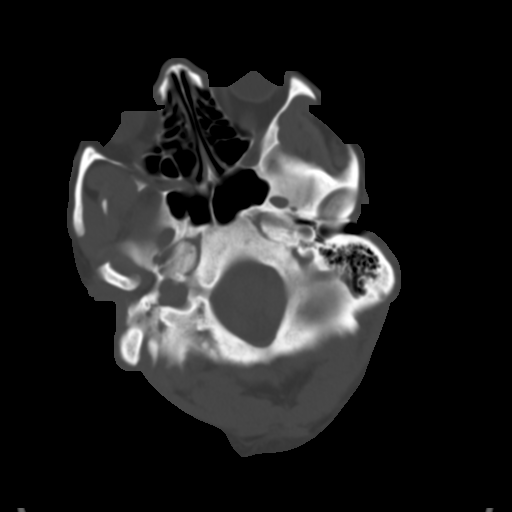
[im 7/31  brain]
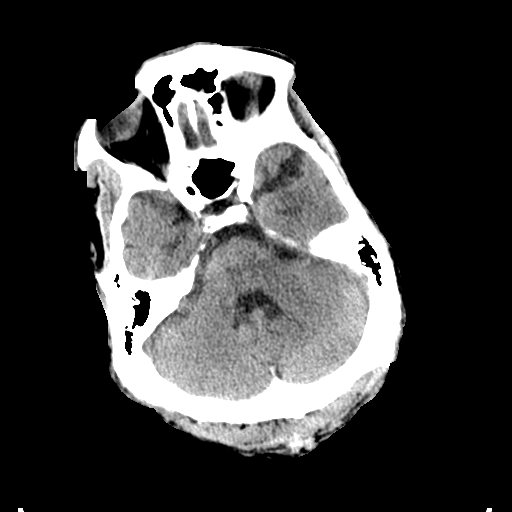
[im 10/31  brain]
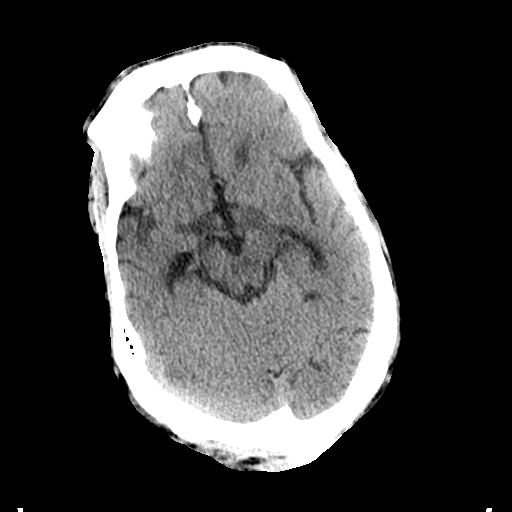
[im 14/31  brain]
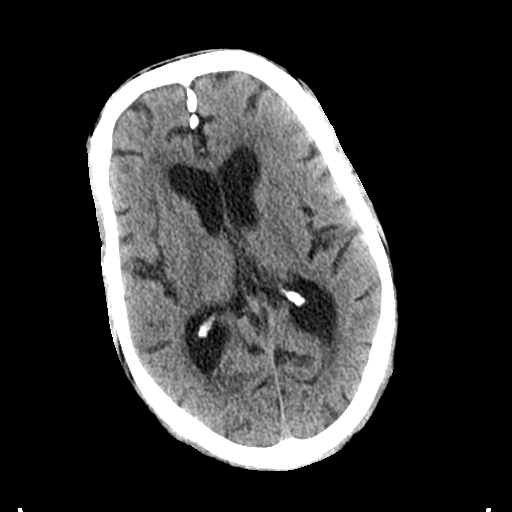
[im 17/31  brain]
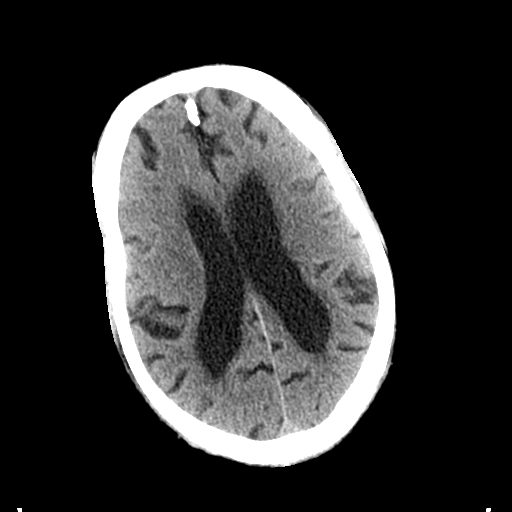
[im 17/31  bone]
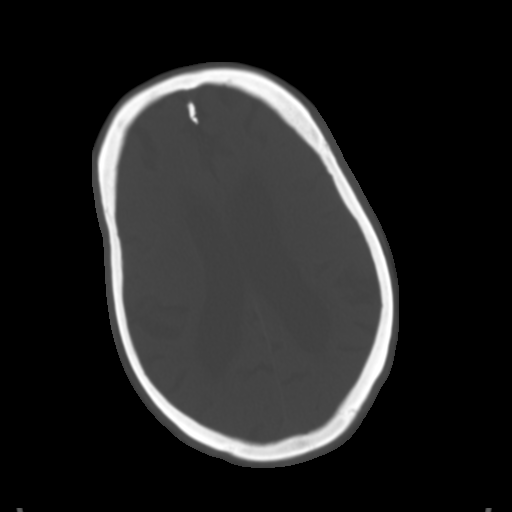
[im 21/31  brain]
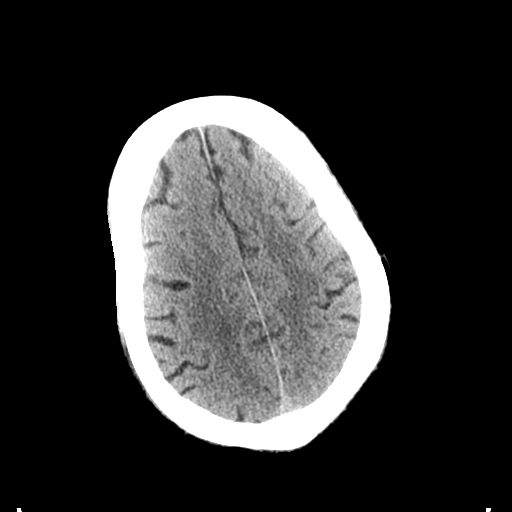
[im 24/31  brain]
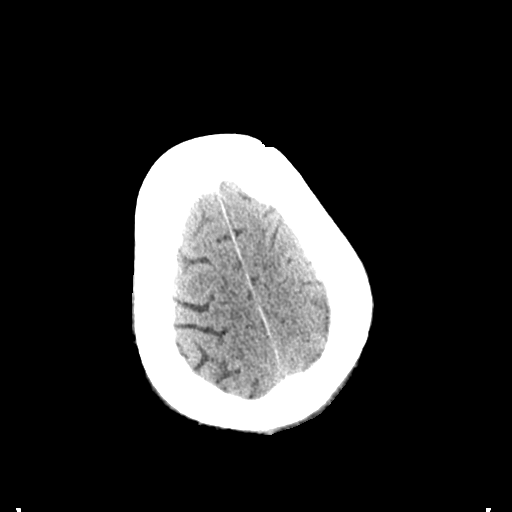
[im 28/31  brain]
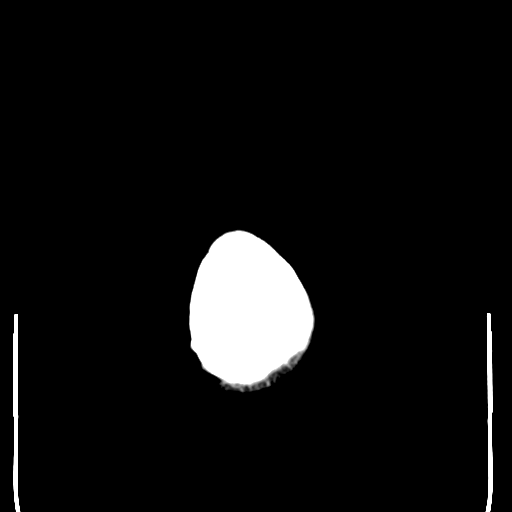

[Series 6: coronal soft tissue · coronal · 0.30mm/px · 3 of 82 slices shown]
[im 28/82  brain]
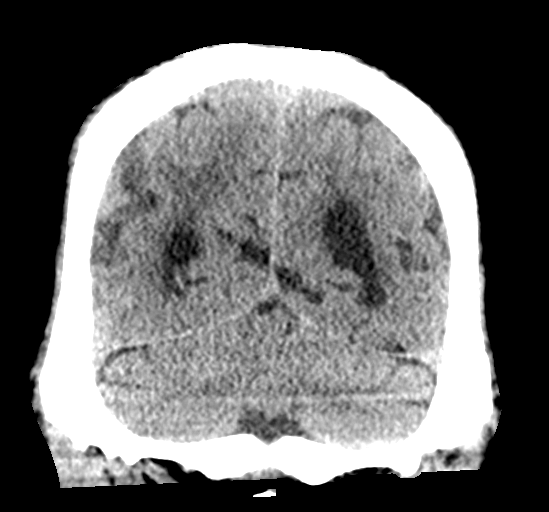
[im 37/82  brain]
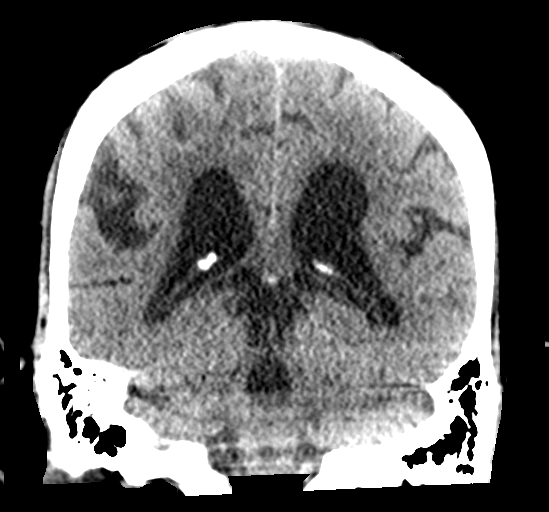
[im 46/82  brain]
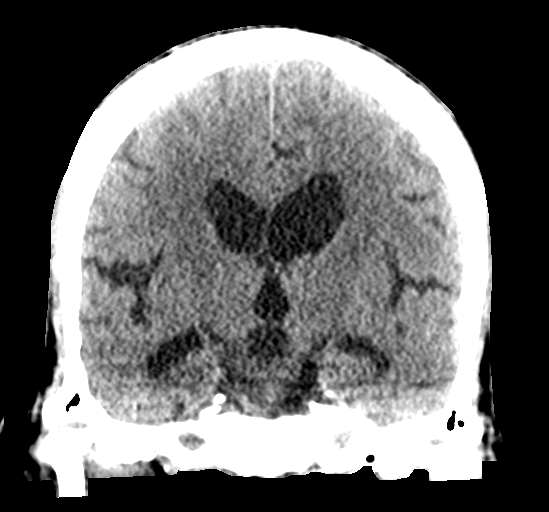

[Series 7: sagittal soft tissue · sagittal · 0.29mm/px · 3 of 54 slices shown]
[im 18/54  brain]
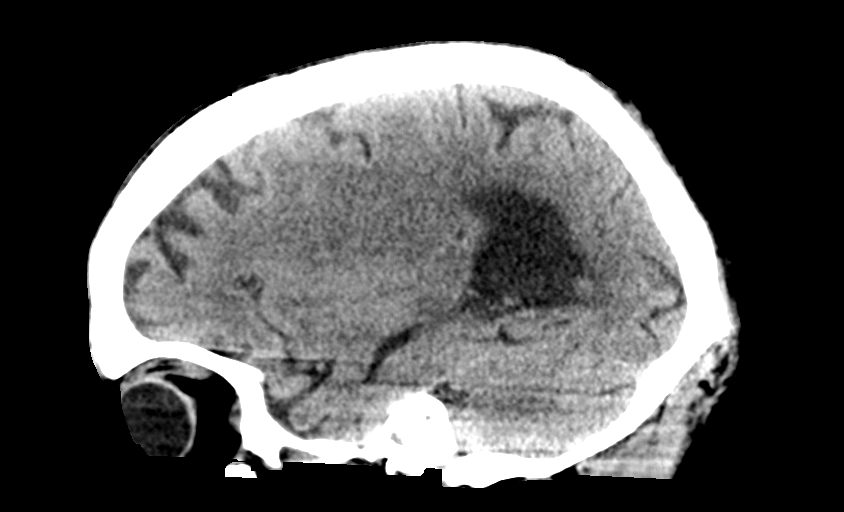
[im 27/54  brain]
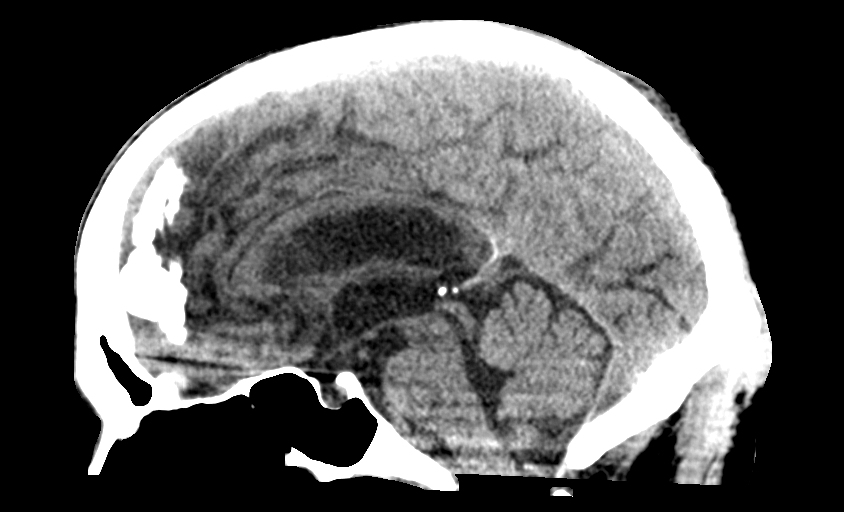
[im 36/54  brain]
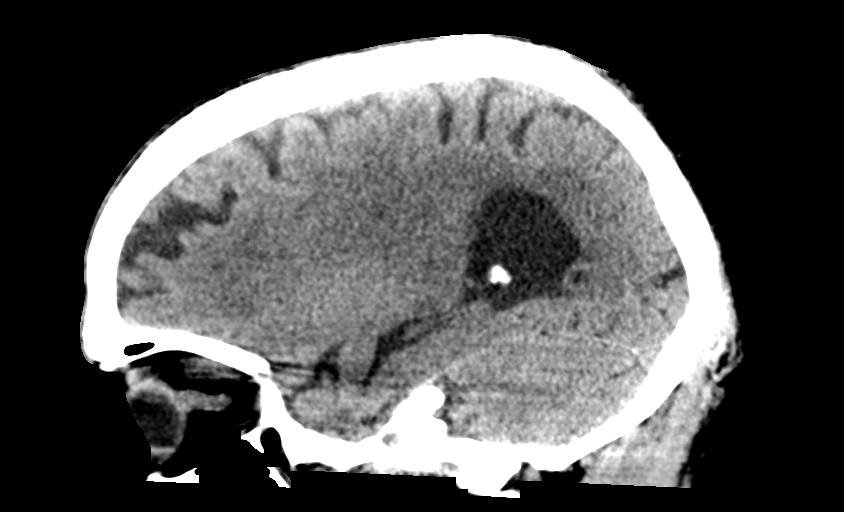

[14 of 47 positions shown; findings below may reference images not displayed]

FINDINGS: Brain: No evidence of acute infarction, hemorrhage, cerebral edema,
mass, mass effect, or midline shift. No extra-axial fluid
collection. Ventriculomegaly, with some sulcal crowding around the
vertex.

Vascular: No hyperdense vessel.

Skull: Normal. Negative for fracture or focal lesion.

Sinuses/Orbits: No acute finding. Status post bilateral lens
replacements.

Other: The mastoid air cells are well aerated.
IMPRESSION: IMPRESSION
1. Ventriculomegaly, with some sulcal crowding around the vertex, as
can be seen in the setting of normal pressure hydrocephalus,
although diffuse cerebral volume loss can also appear similar.
Correlate with symptoms and consider CSF tap test if clinically
indicated.
2. No other acute intracranial process.

## 2021-09-08 IMAGING — CT CT FEMUR *R* W/ CM
2 of 3 series · 13 of 35 positions shown, 16 images · IV contrast (agent unspecified)
Comparison: None.

CLINICAL DATA: Soft tissue infection suspected, thigh x-ray done.
Multiple sores on the right leg. Concern for infection.

EXAM:
CT OF THE LOWER RIGHT EXTREMITY WITH CONTRAST
TECHNIQUE: Multidetector CT imaging of the lower right extremity was performed
according to the standard protocol following intravenous contrast
administration.

[Series 4: axial st · axial · 0.55mm/px · z∈[-1251,-737]mm · 10 of 305 slices shown, 13 images]
[im 24/305  soft-tissue]
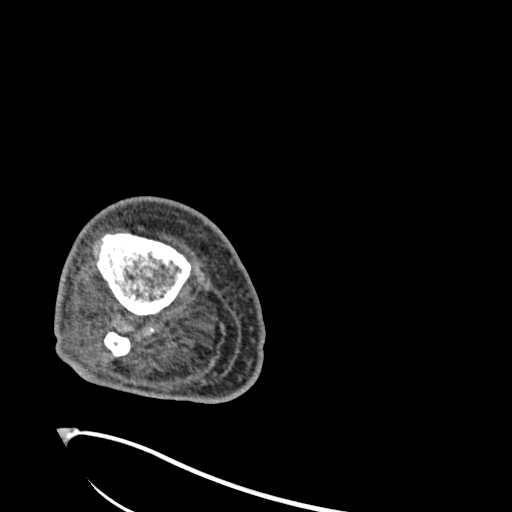
[im 24/305  bone]
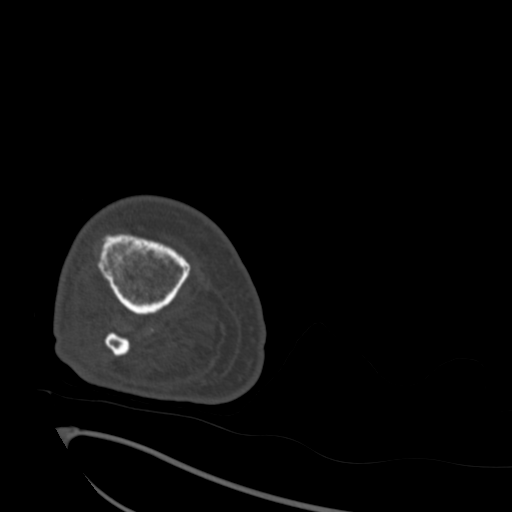
[im 47/305  bone]
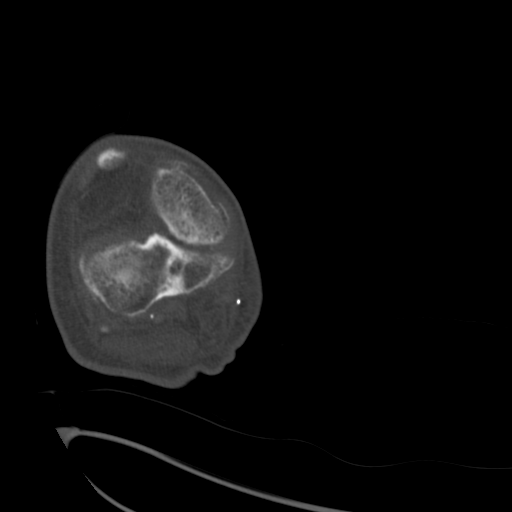
[im 94/305  bone]
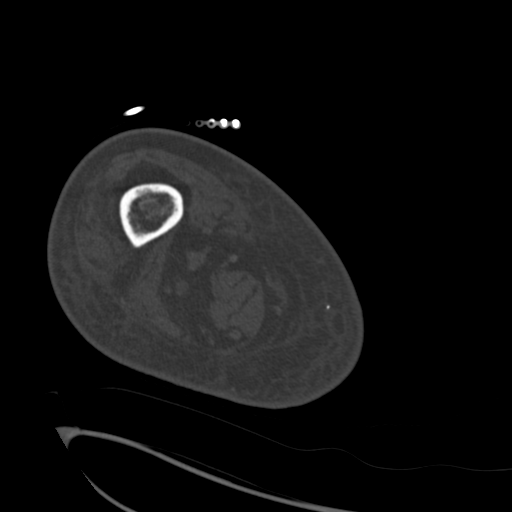
[im 117/305  bone]
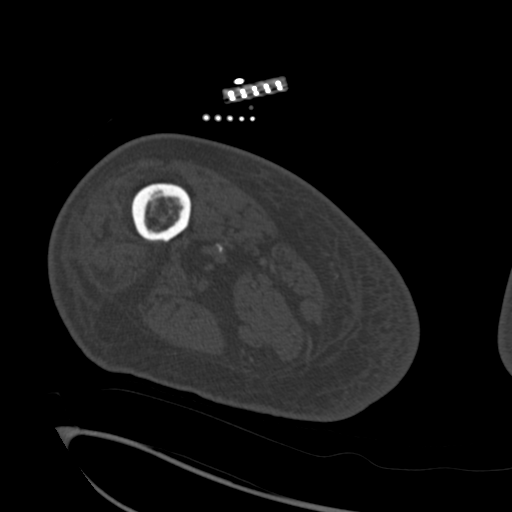
[im 141/305  soft-tissue]
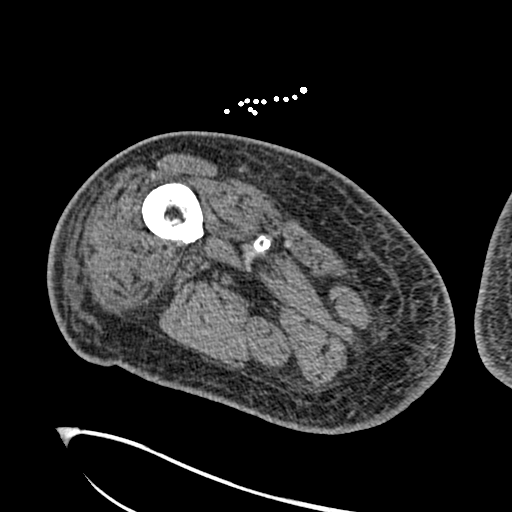
[im 141/305  bone]
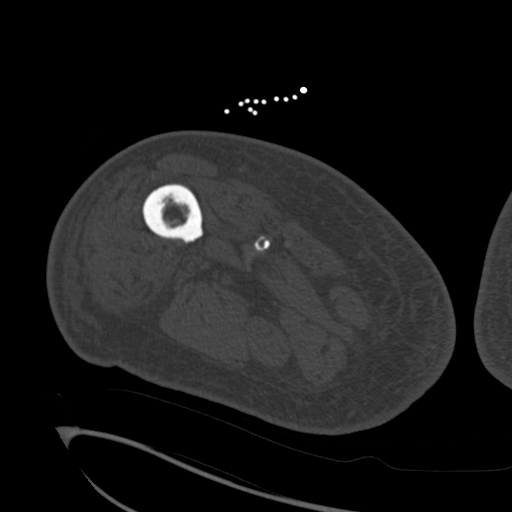
[im 164/305  bone]
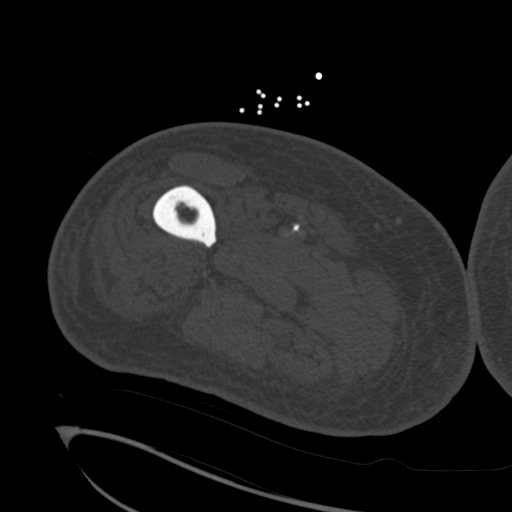
[im 188/305  bone]
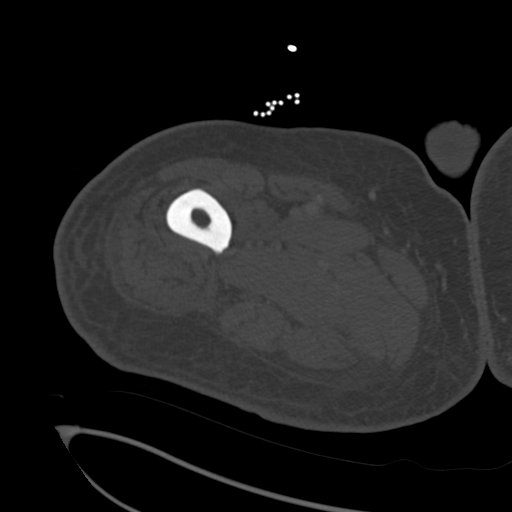
[im 234/305  bone]
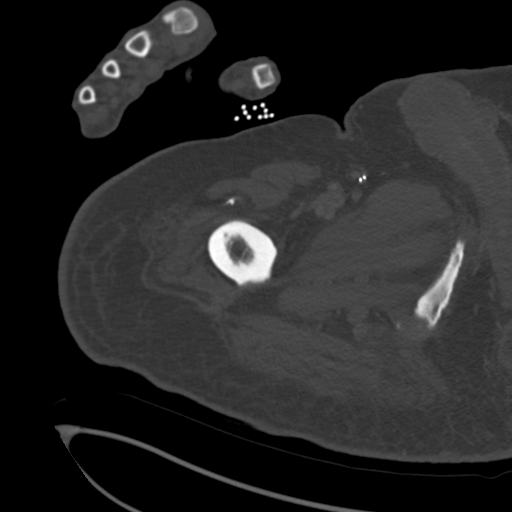
[im 258/305  soft-tissue]
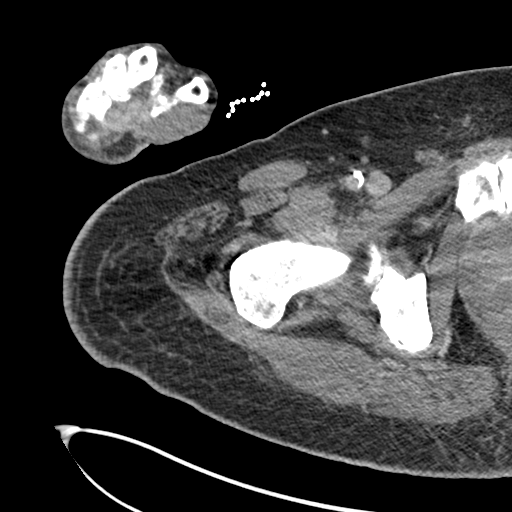
[im 258/305  bone]
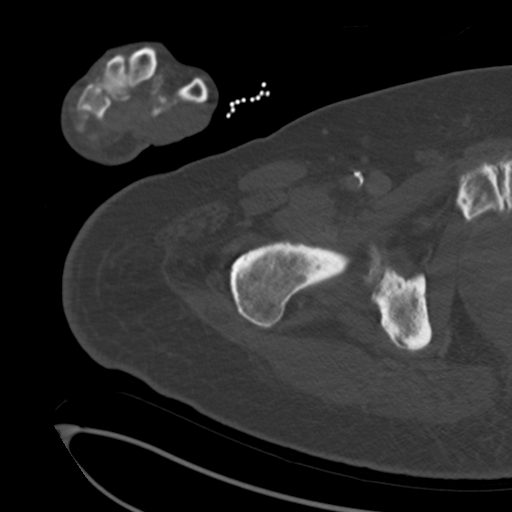
[im 281/305  bone]
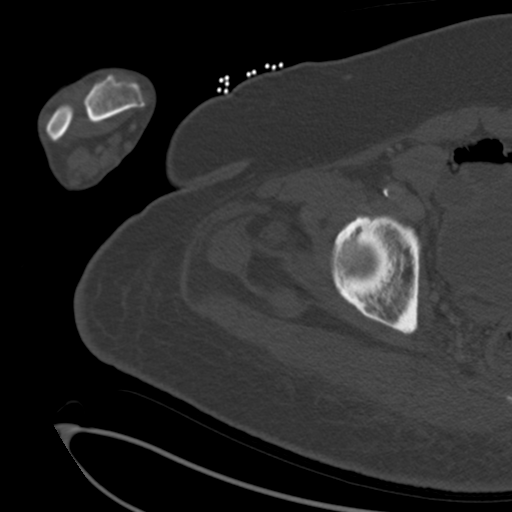

[Series 9: coronal st · coronal · 0.56mm/px · 3 of 106 slices shown]
[im 22/106  bone]
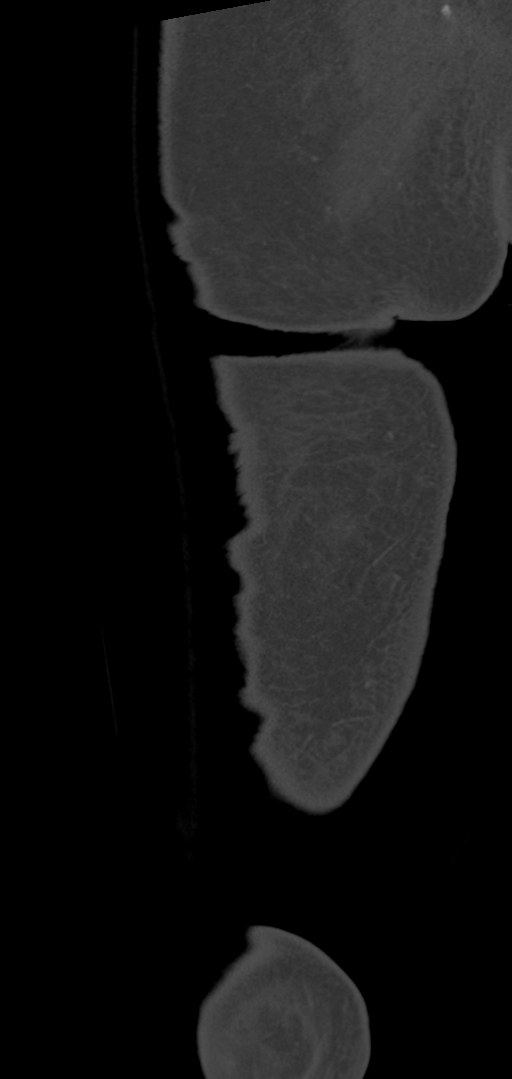
[im 43/106  bone]
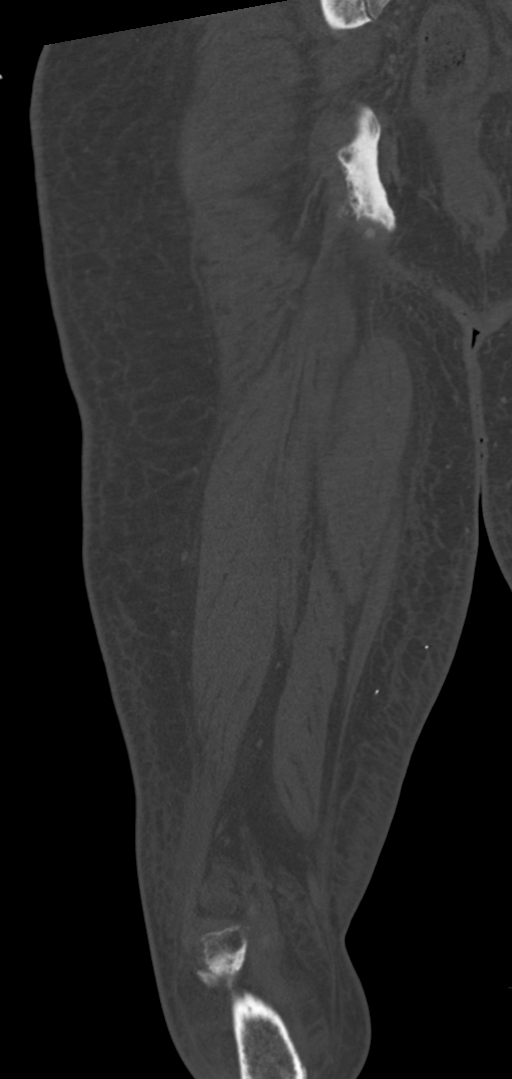
[im 64/106  bone]
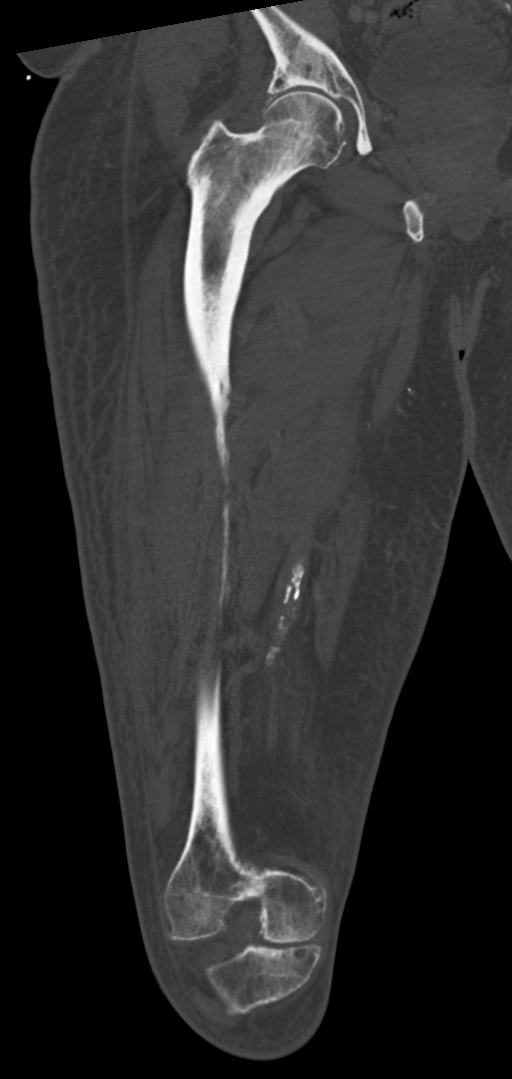

[13 of 35 positions shown; findings below may reference images not displayed]

RADIATION DOSE REDUCTION: This exam was performed according to the
departmental dose-optimization program which includes automated
exposure control, adjustment of the mA and/or kV according to
patient size and/or use of iterative reconstruction technique.

CONTRAST:  100mL OMNIPAQUE IOHEXOL 300 MG/ML  SOLN
FINDINGS: Bones/Joint/Cartilage

No fracture or dislocation. Normal alignment. No joint effusion.
Mild-to-moderate left knee osteoarthritis.

Ligaments

Ligaments are suboptimally evaluated by CT.

Muscles and Tendons
Generalized muscle atrophy of the thigh and proximal leg muscles. No
intramuscular fluid collection. The quadriceps tendon is intact. The
patellar tendon is also maintained.

Soft tissue
Marked subcutaneous soft tissue edema. There is small incompletely
imaged fluid collection about the incompletely imaged proximal
tibia.
IMPRESSION: 1. No evidence of fracture or dislocation. Mild-to-moderate left
knee osteoarthritis.

2.  Generalized muscle atrophy.

3. Skin thickening and subcutaneous soft tissue edema suggesting
mild cellulitis.

4. There is a ill-defined fluid collection about the incompletely
imaged proximal tibia.

## 2021-09-08 IMAGING — CT CT ABD-PELV W/ CM
2 of 5 series · 16 of 46 positions shown, 18 images · IV contrast (agent unspecified)
Comparison: None.

CLINICAL DATA: Sepsis.

EXAM:
CT ABDOMEN AND PELVIS WITH CONTRAST
TECHNIQUE: Multidetector CT imaging of the abdomen and pelvis was performed
using the standard protocol following bolus administration of
intravenous contrast.

[Series 3: axial st · axial · 0.98mm/px · z∈[-851,-441]mm · 13 of 96 slices shown, 15 images]
[im 7/96  soft-tissue]
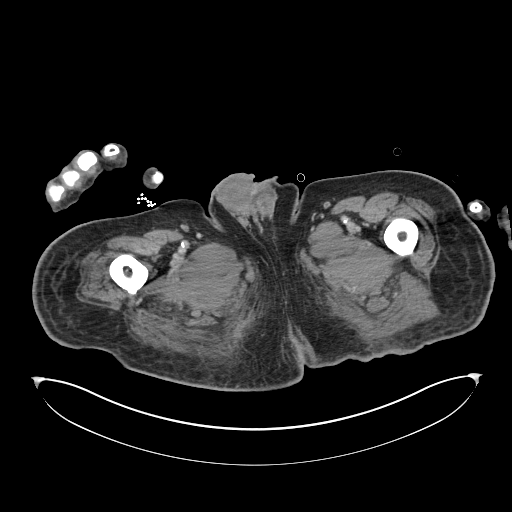
[im 7/96  bone]
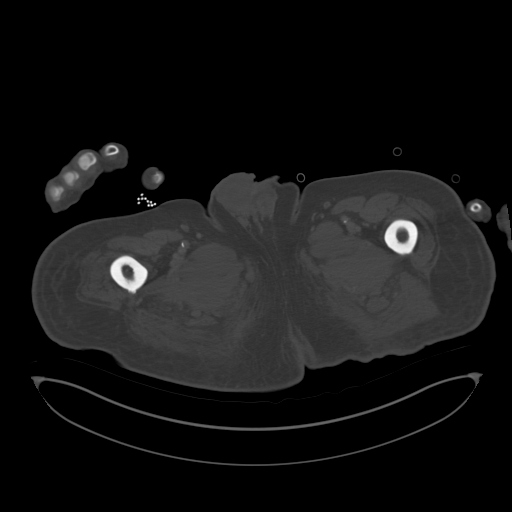
[im 14/96  soft-tissue]
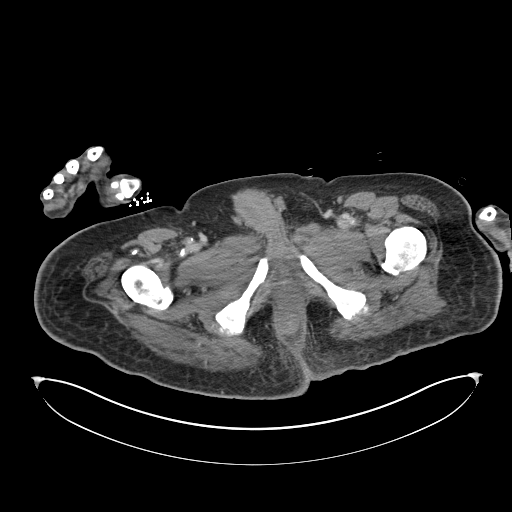
[im 21/96  soft-tissue]
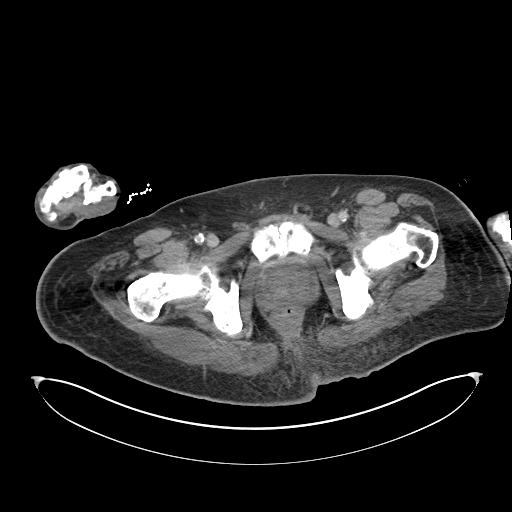
[im 28/96  soft-tissue]
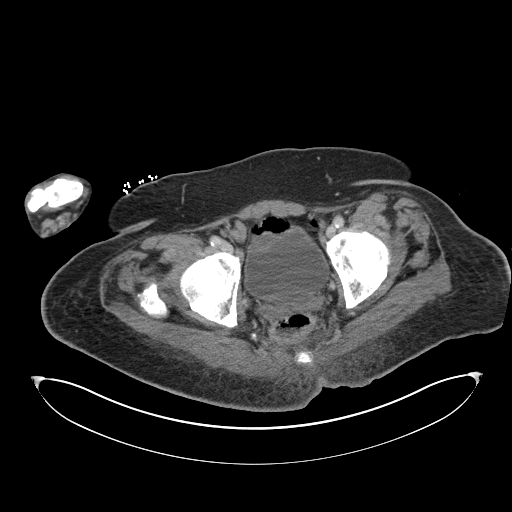
[im 34/96  soft-tissue]
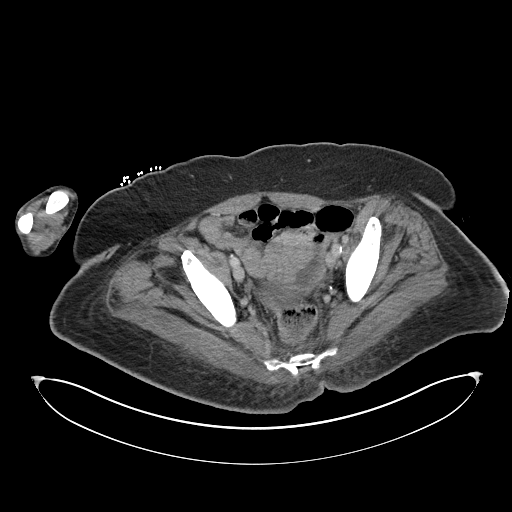
[im 41/96  soft-tissue]
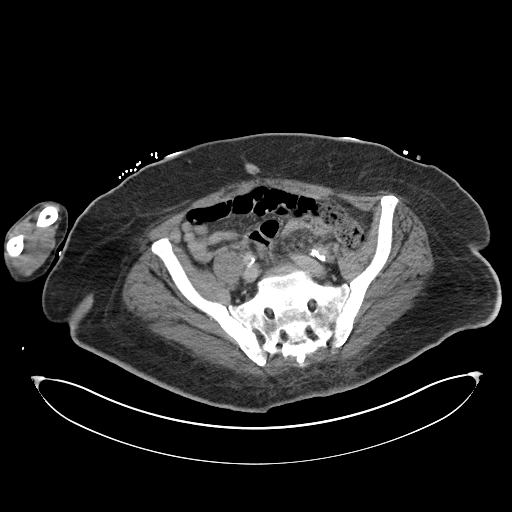
[im 48/96  soft-tissue]
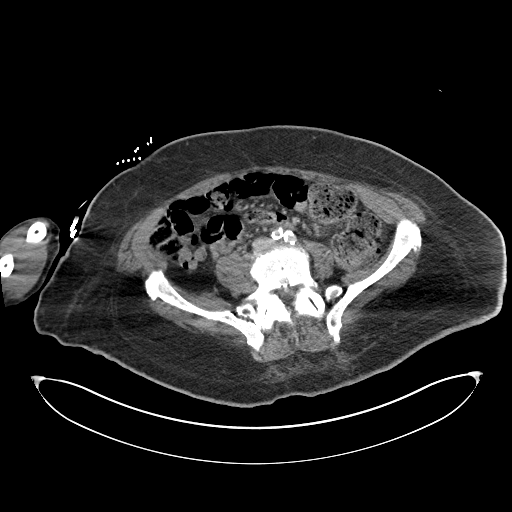
[im 55/96  soft-tissue]
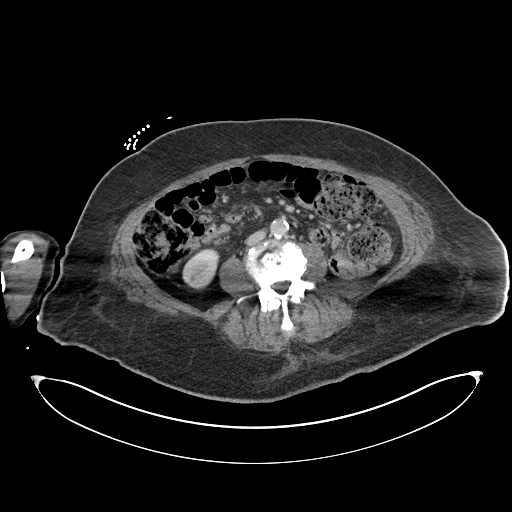
[im 62/96  soft-tissue]
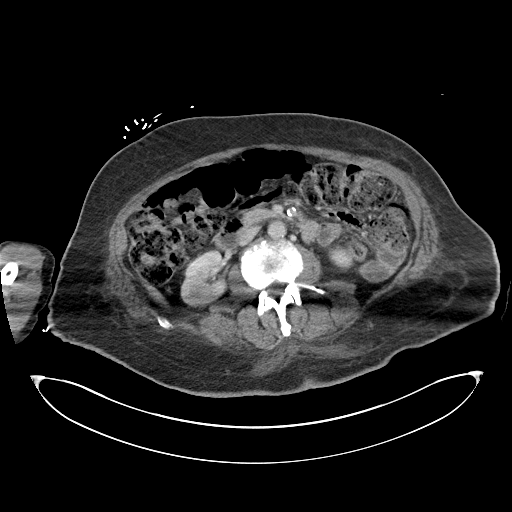
[im 62/96  bone]
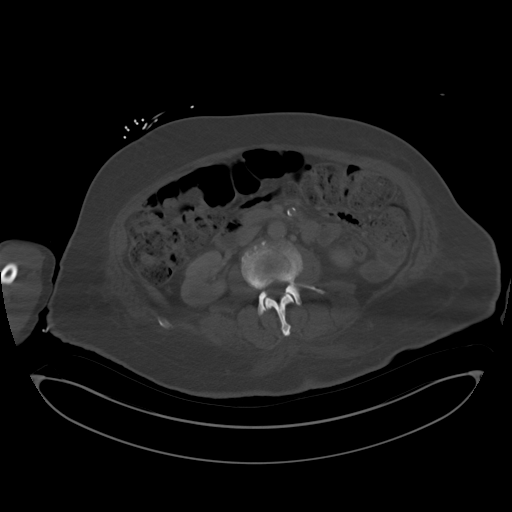
[im 68/96  soft-tissue]
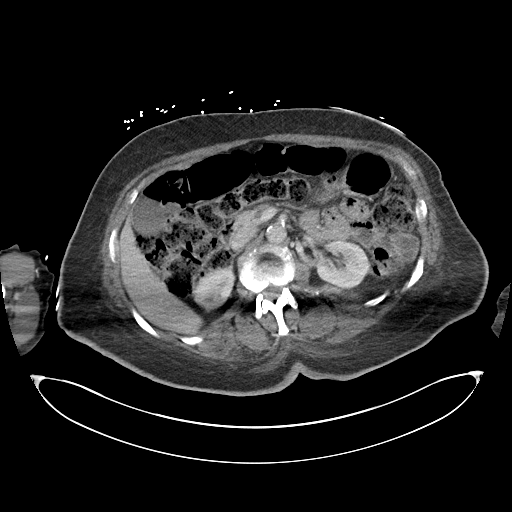
[im 75/96  soft-tissue]
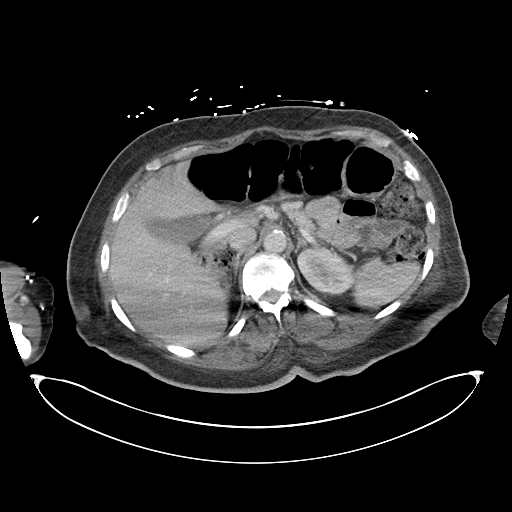
[im 82/96  soft-tissue]
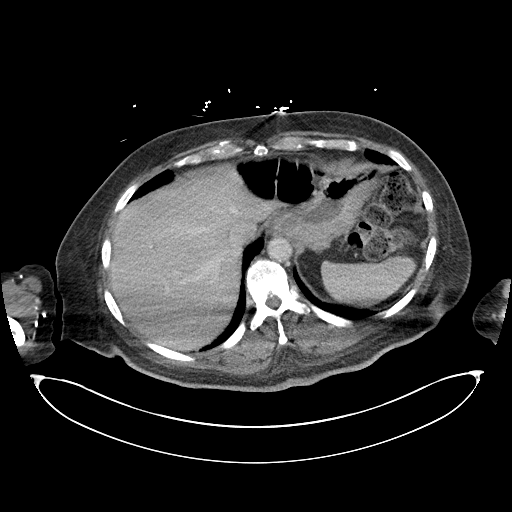
[im 89/96  soft-tissue]
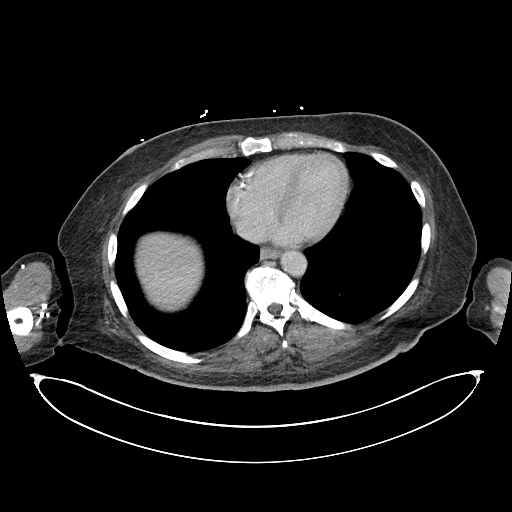

[Series 5: coronal st · coronal · 0.95mm/px · 3 of 156 slices shown]
[im 52/156  soft-tissue]
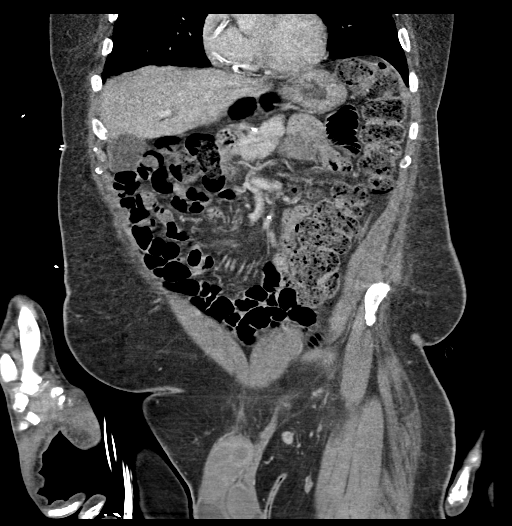
[im 69/156  soft-tissue]
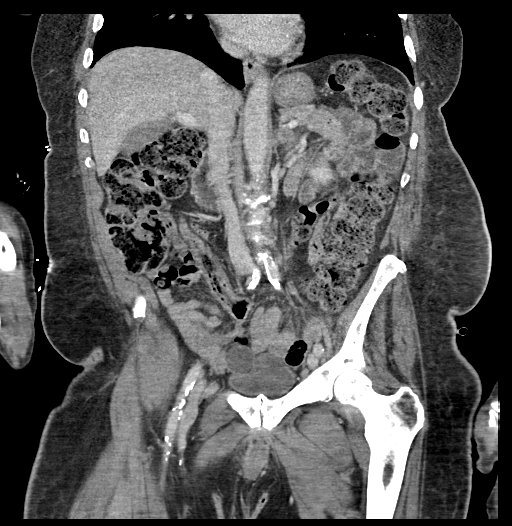
[im 87/156  soft-tissue]
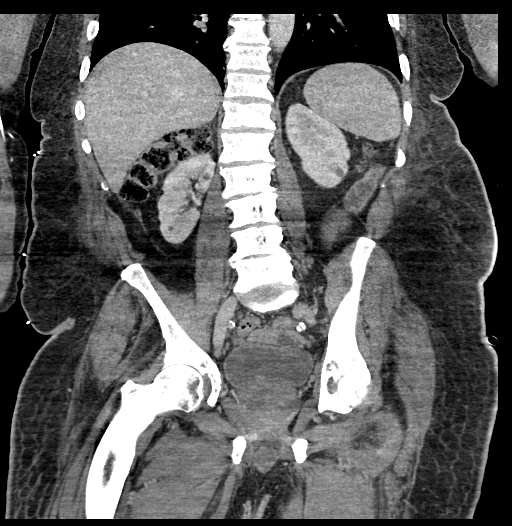

[16 of 46 positions shown; findings below may reference images not displayed]

RADIATION DOSE REDUCTION: This exam was performed according to the
departmental dose-optimization program which includes automated
exposure control, adjustment of the mA and/or kV according to
patient size and/or use of iterative reconstruction technique.

CONTRAST:  100mL OMNIPAQUE IOHEXOL 300 MG/ML  SOLN
FINDINGS: Lower chest: No acute abnormality.

Hepatobiliary: No focal liver abnormality is seen. No gallstones,
gallbladder wall thickening, or biliary dilatation.

Pancreas: Unremarkable. No pancreatic ductal dilatation or
surrounding inflammatory changes.

Spleen: Normal in size without focal abnormality.

Adrenals/Urinary Tract: There are rounded hypodensities in the
inferior kidneys bilaterally which may represent cysts. There is no
hydronephrosis or perinephric fluid. The adrenal glands and bladder
are within normal limits.

Stomach/Bowel: Stomach is within normal limits. Appendix appears
normal. No evidence of bowel wall thickening, distention, or
inflammatory changes. There is a large amount of stool throughout
the colon.

Vascular/Lymphatic: Aortic atherosclerosis. No enlarged abdominal or
pelvic lymph nodes.

Reproductive: Prostate gland is enlarged.

Other: There is no ascites or free air. No focal abdominal wall
hernia. There is mild presacral edema.

Musculoskeletal: There is subcutaneous edema overlying the sacrum
and coccyx without focal fluid collection identified. There is a
linear tract from the level of the left anus inferiorly to the
inferior left buttock skin. This does not contain air.

No underlying acute osseous abnormality identified. Degenerative
changes affect the spine.
IMPRESSION: 1. Subcutaneous edema overlying the sacrum and coccyx may represent
cellulitis or be posttraumatic.
2. Small tract from the left anus extending inferiorly into the skin
of the left buttocks. No evidence for soft tissue gas or drainable
fluid collection.
3. Mild presacral edema.
4. No acute bony abnormality.
5.  Aortic Atherosclerosis ([P5]-[P5]).

## 2021-09-08 MED ORDER — SODIUM CHLORIDE 0.9 % IV SOLN
2.0000 g | Freq: Once | INTRAVENOUS | Status: AC
  Administered 2021-09-08: 2 g via INTRAVENOUS
  Filled 2021-09-08: qty 12.5

## 2021-09-08 MED ORDER — PREDNISOLONE ACETATE 1 % OP SUSP
1.0000 [drp] | Freq: Four times a day (QID) | OPHTHALMIC | Status: DC
  Administered 2021-09-09 – 2021-09-13 (×19): 1 [drp] via OPHTHALMIC
  Filled 2021-09-08: qty 5

## 2021-09-08 MED ORDER — DORZOLAMIDE HCL 2 % OP SOLN
1.0000 [drp] | Freq: Two times a day (BID) | OPHTHALMIC | Status: DC
  Administered 2021-09-09 – 2021-09-13 (×10): 1 [drp] via OPHTHALMIC
  Filled 2021-09-08: qty 10

## 2021-09-08 MED ORDER — POLYETHYLENE GLYCOL 3350 17 G PO PACK
17.0000 g | PACK | Freq: Every day | ORAL | Status: DC
  Administered 2021-09-09 – 2021-09-11 (×3): 17 g via ORAL
  Filled 2021-09-08 (×3): qty 1

## 2021-09-08 MED ORDER — IOHEXOL 300 MG/ML  SOLN
100.0000 mL | Freq: Once | INTRAMUSCULAR | Status: AC | PRN
  Administered 2021-09-08: 100 mL via INTRAVENOUS

## 2021-09-08 MED ORDER — ASPIRIN 325 MG PO TABS
325.0000 mg | ORAL_TABLET | Freq: Every day | ORAL | Status: DC
Start: 2021-09-09 — End: 2021-09-13
  Administered 2021-09-09 – 2021-09-13 (×5): 325 mg via ORAL
  Filled 2021-09-08 (×5): qty 1

## 2021-09-08 MED ORDER — SODIUM CHLORIDE 0.9 % IV SOLN
1.0000 g | INTRAVENOUS | Status: DC
  Administered 2021-09-09 – 2021-09-13 (×5): 1 g via INTRAVENOUS
  Filled 2021-09-08 (×5): qty 10

## 2021-09-08 MED ORDER — VANCOMYCIN HCL IN DEXTROSE 1-5 GM/200ML-% IV SOLN
1000.0000 mg | INTRAVENOUS | Status: DC
Start: 2021-09-09 — End: 2021-09-13
  Administered 2021-09-09 – 2021-09-12 (×4): 1000 mg via INTRAVENOUS
  Filled 2021-09-08 (×4): qty 200

## 2021-09-08 MED ORDER — VANCOMYCIN HCL IN DEXTROSE 1-5 GM/200ML-% IV SOLN: 1000.0000 mg | Freq: Once | INTRAVENOUS | Status: DC

## 2021-09-08 MED ORDER — SIMVASTATIN 20 MG PO TABS
20.0000 mg | ORAL_TABLET | Freq: Every day | ORAL | Status: DC
  Administered 2021-09-09 – 2021-09-12 (×5): 20 mg via ORAL
  Filled 2021-09-08 (×5): qty 1

## 2021-09-08 MED ORDER — METRONIDAZOLE 500 MG/100ML IV SOLN
500.0000 mg | Freq: Once | INTRAVENOUS | Status: AC
  Administered 2021-09-08: 500 mg via INTRAVENOUS
  Filled 2021-09-08: qty 100

## 2021-09-08 MED ORDER — METRONIDAZOLE 500 MG/100ML IV SOLN
500.0000 mg | Freq: Two times a day (BID) | INTRAVENOUS | Status: DC
  Administered 2021-09-09 – 2021-09-11 (×5): 500 mg via INTRAVENOUS
  Filled 2021-09-08 (×5): qty 100

## 2021-09-08 MED ORDER — LOSARTAN POTASSIUM 50 MG PO TABS
25.0000 mg | ORAL_TABLET | Freq: Every day | ORAL | Status: DC
Start: 2021-09-09 — End: 2021-09-13
  Administered 2021-09-09 – 2021-09-13 (×5): 25 mg via ORAL
  Filled 2021-09-08 (×5): qty 1

## 2021-09-08 MED ORDER — ACETAMINOPHEN 325 MG PO TABS
650.0000 mg | ORAL_TABLET | Freq: Once | ORAL | Status: AC
  Administered 2021-09-08: 650 mg via ORAL
  Filled 2021-09-08: qty 2

## 2021-09-08 MED ORDER — ENOXAPARIN SODIUM 40 MG/0.4ML IJ SOSY
40.0000 mg | PREFILLED_SYRINGE | INTRAMUSCULAR | Status: DC
  Administered 2021-09-09 – 2021-09-12 (×5): 40 mg via SUBCUTANEOUS
  Filled 2021-09-08 (×5): qty 0.4

## 2021-09-08 MED ORDER — POTASSIUM CHLORIDE 10 MEQ/100ML IV SOLN
10.0000 meq | INTRAVENOUS | Status: AC
  Administered 2021-09-09 (×2): 10 meq via INTRAVENOUS
  Filled 2021-09-08 (×2): qty 100

## 2021-09-08 MED ORDER — CEFTRIAXONE SODIUM 1 G IJ SOLR: 1.0000 g | INTRAMUSCULAR | Status: DC

## 2021-09-08 MED ORDER — VANCOMYCIN HCL 1750 MG/350ML IV SOLN
1750.0000 mg | Freq: Once | INTRAVENOUS | Status: AC
  Administered 2021-09-08: 1750 mg via INTRAVENOUS
  Filled 2021-09-08: qty 350

## 2021-09-08 MED ORDER — LACTATED RINGERS IV BOLUS (SEPSIS)
1000.0000 mL | Freq: Once | INTRAVENOUS | Status: AC
  Administered 2021-09-08: 1000 mL via INTRAVENOUS

## 2021-09-08 NOTE — Sepsis Progress Note (Signed)
[facility] is monitoring this sepsis 

## 2021-09-08 NOTE — Assessment & Plan Note (Signed)
Continue statin. 

## 2021-09-08 NOTE — Assessment & Plan Note (Signed)
-  scatted pressure ulcers throughout body including bilateral posterior shoulder, mid lumbar spine, buttocks and gluteal cleft, medial groin.  Mid lumbar spine pressure ulcer most apparent to have active infection with purulent drainage. ?-Continue IV vancomycin, Rocephin and Flagyl ?- Consult wound care ?

## 2021-09-08 NOTE — Assessment & Plan Note (Signed)
Mildly elevated with AST of 95, ALT of 69, total bilirubin 1.3.  Unclear etiology, benign abdominal exam.  Follow with repeat CMP in the morning. ?

## 2021-09-08 NOTE — ED Notes (Signed)
Patient moved to hospital bed to help prevent skin breakdown.  Soiled pads removed. ?

## 2021-09-08 NOTE — Assessment & Plan Note (Signed)
At baseline can ambulate with prosthetics ?

## 2021-09-08 NOTE — Assessment & Plan Note (Signed)
Large stool burden noted on CT abdomen.  Daily MiraLAX. ?

## 2021-09-08 NOTE — ED Triage Notes (Signed)
Pt BIB EMS from home for bedsores and weakness. Per EMS pt's brother stated he started to have confusion three weeks ago and it continually got worse.  ? ?114/50 ?84 hr ?18 rr ?204 cbg ?

## 2021-09-08 NOTE — Progress Notes (Signed)
A consult was received from an ED physician for vancomycin and cefepime per pharmacy dosing.  The patient's profile has been reviewed for ht/wt/allergies/indication/available labs.   ?A one time order has been placed for vancomycin 1750mg  IV x1 and cefepime 2g IV x1.   ? ? ?Further antibiotics/pharmacy consults should be ordered by admitting physician if indicated.       ?                ?Thank you, ? ? , PharmD ?09/08/2021 4:13 PM ? ?

## 2021-09-08 NOTE — Assessment & Plan Note (Signed)
Continue losartan. 

## 2021-09-08 NOTE — ED Notes (Signed)
Dr. Cyndia Bent made aware that patient would have MRI in the morning. ?

## 2021-09-08 NOTE — ED Notes (Addendum)
Pt has extensive number bedsores in varying stages and multiple skin tears.  Sores and tears were visualized by this ED RN, Annita Brod., RN and MD Criss Alvine.   ? ?Pt has sores, wounds, skin tears along length of spine, buttocks, posterior bilateral arms, bilateral legs, groin, bilateral inner thighs, scrotum, anterior left chest, posterior head, posterior bilateral back (scapula areas). ?

## 2021-09-08 NOTE — Assessment & Plan Note (Signed)
Likely secondary to cellulitis since he presented with fever and purulent pressure ulcers throughout.  However, CT of the head shows ventriculomegaly but difficult to clinically correlation since he has ongoing infections. Unable to get LP due to midline lumbar wound infection.  ?-Obtain MRI brain to further evaluate ?

## 2021-09-08 NOTE — H&P (Addendum)
?History and Physical  ? ? ?Patient: Scott Reynolds [id number] DOB: [birth year] ?DOA: 09/08/2021 ?DOS: the patient was seen and examined on 09/08/2021 ?PCP: Delene Ruffini, MD  ?Patient coming from: Home ? ?Chief Complaint:  ?Chief Complaint  ?Patient presents with  ? Weakness  ? Bed Sores  ? ?HPI: Scott Reynolds is a 81 y.o. 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 ? ?History is provided by his brother over the phone.  Patient lives alone but his brother helps to take care of him daily.  About 2 weeks ago patient was still able to walk around on his prosthetic legs and was interactive and able to manage his own medication.  However he started to have bowel and bladder incontinence about 2 weeks ago.  Then this past week has been less conversational and sleeping more.  Has not been ambulating and has mostly been sitting in his wheelchair.  Also started to have trouble managing his medication.  Had decreased appetite.  Had intermittent diarrhea.  No nausea, vomiting or abdominal pain.  No fever. ? ?In the ED, he was febrile up to 100.9 F, normotensive on room air.  No leukocytosis with a mild anemia with hemoglobin of 12.6.  Mild hypokalemia with potassium of 3.  CBG of 173.  Mildly elevated AST of 95, ALT of 69, total bilirubin of 1.3. ? ?CT head showing ventral megaly concerning for normal pressure hydrocephalus.  No other acute intracranial process. ? ?CT of the right femur showed no acute fracture but had findings suggestive of mild cellulitis. ? ?CT of the abdomen and pelvis showed subcutaneous edema overlying sacral and coccyx area representing cellulitis, small track from left anus extending inferiorly into the skin of the left buttocks but no evidence of soft tissue gas or drainable fluid. ? ?review of Systems: unable to review all systems due to the inability of the  patient to answer questions. ?Past Medical History:  ?Diagnosis Date  ? Diabetes mellitus without complication ([place])   ? Erectile dysfunction   ? Glaucoma   ? Hyperlipidemia   ? Hypertension   ? ?Past Surgical History:  ?Procedure Laterality Date  ? LEG AMPUTATION ABOVE KNEE Bilateral   ? 2001 left leg and 2003 right leg  ? ?Social History: Denies tobacco, alcohol or illicit drug use ? ?No Known Allergies ? ?Family History  ?Problem Relation Age of Onset  ? Diabetes Brother   ? Cancer Neg Hx   ? ? ?Prior to Admission medications   ?Medication Sig Start Date End Date Taking? Authorizing Provider  ?aspirin 81 MG tablet Take 81 mg by mouth daily.    [provider]  ?bimatoprost (LUMIGAN) 0.01 % SOLN 1 drop at bedtime.    [provider]  ?Blood Glucose Monitoring Suppl ([place]) w/Device KIT 1 each by Does not apply route daily. Use OneTouch Verio Flex meter to check blood sugar once daily.    [provider]  ?brimonidine (ALPHAGAN) 0.15 % ophthalmic solution  09/18/14   [provider]  ?COMBIGAN 0.2-0.5 % ophthalmic solution  10/15/12   [provider]  ?COVID-19 mRNA Vac-TriS, Pfizer, SUSP injection Inject into the muscle. 10/27/20   Carlyle Basques, MD  ?dorzolamide (TRUSOPT) 2 % ophthalmic solution  01/18/13   [provider]  ?glipiZIDE (GLUCOTROL XL) 2.5 MG 24 hr tablet Take 1 tablet (2.5 mg total) by mouth  daily with breakfast. 07/02/20   Elayne Snare, MD  ?glucose blood test strip 1 each by Other route daily. Use OneTouch Verio test strips as instructed to check blood sugar once daily. 01/20/19   Elayne Snare, MD  ?latanoprost (XALATAN) 0.005 % ophthalmic solution  09/18/14   [provider]  ?losartan (COZAAR) 25 MG tablet TAKE 1 TABLET BY MOUTH TWICE A DAY 08/24/21   Elayne Snare, MD  ?metFORMIN (GLUCOPHAGE) 1000 MG tablet TAKE 1 TABLET BY MOUTH TWICE A DAY 02/24/21   Elayne Snare, MD  ?prednisoLONE acetate (PRED FORTE) 1 % ophthalmic  suspension  01/05/14   [provider]  ?simvastatin (ZOCOR) 20 MG tablet TAKE 1 TABLET BY MOUTH EVERYDAY AT BEDTIME 06/24/21   Elayne Snare, MD  ?timolol (TIMOPTIC) 0.5 % ophthalmic solution  09/18/14   [provider]  ? ? ?Physical Exam: ? ? ? ?Vitals:  ? 09/08/21 1945 09/08/21 2000 09/08/21 2015 09/08/21 2030  ?BP:  (!) 120/46    ?Pulse: 67 68 66 70  ?Resp: 18 18 (!) 0 17  ?Temp:      ?TempSrc:      ?SpO2: 91% 96% 95% 94%  ?Weight:      ?Height:      ? ?Constitutional: NAD, calm, fatigue appearing elderly male laying asleep in bed ?Eyes: Clouding of the left conjunctiva ?ENMT: Mucous membranes are moist.  ?Neck: normal, supple ?Respiratory: clear to auscultation bilaterally, no wheezing, no crackles. Normal respiratory effort. No accessory muscle use.  ?Cardiovascular: Regular rate and rhythm, no murmurs / rubs / gallops.  ?Abdomen: no tenderness, no masses palpated. ?Musculoskeletal: no clubbing / cyanosis.  Bilateral BKA ?Skin: pressure ulcers with sloughing of skin and exposure of subcutaneous tissue seen on proximal posterior shoulder, midline lumbar spine with purulent drainage, proximal buttocks, gluteal cleft, right mid back paraspinal, medial groin region ?Neurologic: CN 2-12 grossly intact.  ?Psychiatric: Awoke to voice but mainly kept eyes closed. Responsive to some questions but not others. ?Data Reviewed: ? ?See HPI ? ?Assessment and Plan: ?* Cellulitis ?-scatted pressure ulcers throughout body including bilateral posterior shoulder, mid lumbar spine, buttocks and gluteal cleft, medial groin.  Mid lumbar spine pressure ulcer most apparent to have active infection with purulent drainage. ?-Continue IV vancomycin, Rocephin and Flagyl ?- Consult wound care ? ?Acute metabolic encephalopathy ?Likely secondary to cellulitis since he presented with fever and purulent pressure ulcers throughout.  However, CT of the head shows ventriculomegaly but difficult to clinically correlation since he has  ongoing infections. Unable to get LP due to midline lumbar wound infection.  ?-Obtain MRI brain to further evaluate ? ?Cerebral ventriculomegaly ?Patient has been ambulating less with his bilateral BKA, has had increasing urinary and bowel incontinence with altered mental status.  However he also has ongoing cellulitic infection.  Unable to get LP due to ongoing infection in lumbar region. ?-Obtain MRI brain to further evaluate ? ?Abnormal LFTs ?Mildly elevated with AST of 95, ALT of 69, total bilirubin 1.3.  Unclear etiology, benign abdominal exam.  Follow with repeat CMP in the morning. ? ?Constipation ?Large stool burden noted on CT abdomen.  Daily MiraLAX. ? ?Status post below-knee amputation ([place]) ?At baseline can ambulate with prosthetics ? ?Glaucoma ?Continue home ophthalmic solutions ? ?Hypertension ?Continue losartan ? ?Pure hypercholesterolemia ?Continue statin ? ? ? ? ? Advance Care Planning:   Code Status: Full Code  ? ?Consults: None ? ?Family Communication: Discussed with brother who is his healthcare power of attorney over the phone ? ?  Severity of Illness: ?The appropriate patient status for this patient is INPATIENT. Inpatient status is judged to be reasonable and necessary in order to provide the required intensity of service to ensure the patient's safety. The patient's presenting symptoms, physical exam findings, and initial radiographic and laboratory data in the context of their chronic comorbidities is felt to place them at high risk for further clinical deterioration. Furthermore, it is not anticipated that the patient will be medically stable for discharge from the hospital within 2 midnights of admission.  ? ?* I certify that at the point of admission it is my clinical judgment that the patient will require inpatient hospital care spanning beyond 2 midnights from the point of admission due to high intensity of service, high risk for further deterioration and high frequency of surveillance  required.* ? ?Author: Orene Desanctis, DO ?09/08/2021 11:03 PM ? ?For on call review [email or web address].  ?

## 2021-09-08 NOTE — Progress Notes (Addendum)
Pharmacy Antibiotic Note ? ?Scott Reynolds is a 81 y.o. male admitted on 09/08/2021 with weakness and confusion.  Patient is noted to have multiple bed sores in the sacral area, back, groin, inner thighs, chest, and head. PMH notable for bilateral AKA.  Pharmacy has been consulted for vancomycin dosing for cellulitis. ? ?CT A/P with subcutaneous edema overlying sacrum/coccyx may represent cellulitis; small tract of L anus inferiorly to the skin of L buttocks w/o drainable fluid collection. CT of R femur also suggesting mild cellulitis. ? ?In the ED, patient received vancomycin 1750mg  IV x1, cefepime 2g IV x1, and metronidazole 500mg  IV x1. ? ?Scr 1, WBC wnl, TM 10.9 ? ?Plan: ?Begin vancomycin 1000mg  IV q24 hours (eAUC 420, Scr 1, IBW, Vd 0.5) ?Ceftriaxone 1g IV q24 hours per MD ?Flagyl 500mg  IV q12 hours per MD ?F/u culture data, clinical improvement, and ability to narrow antibiotics ?Monitor vancomycin levels as indicated ? ?Height: 5\' 8"  (172.7 cm) ?Weight: 93.4 kg (205 lb 14.6 oz) ?IBW/kg (Calculated) : 68.4 ? ?Temp (24hrs), Avg:100.9 ?F (38.3 ?C), Min:100.9 ?F (38.3 ?C), Max:100.9 ?F (38.3 ?C) ? ?Recent Labs  ?Lab 09/08/21 ?1624 09/08/21 ?1625 09/08/21 ?1642  ?WBC  --  8.6  --   ?CREATININE  --  0.94 1.00  ?LATICACIDVEN 1.5  --   --   ?  ?Estimated Creatinine Clearance: 64.2 mL/min (by C-G formula based on SCr of 1 mg/dL).   ? ?No Known Allergies ? ?Antimicrobials this admission: ?Vancomycin 4/20 >>  ?Flagyl 4/20 >> ?Ceftriaxone 4/21 >>  ?Cefepime x1 4/20 ? ?Dose adjustments this admission: ? ? ?Microbiology results: ?4/20 BCx:  ? ?Thank you for allowing pharmacy to be a part of this patient?s care. ? ?5/20, PharmD ?09/08/2021 8:46 PM ? ?

## 2021-09-08 NOTE — Assessment & Plan Note (Signed)
Continue home ophthalmic solutions ?

## 2021-09-08 NOTE — Assessment & Plan Note (Signed)
Patient has been ambulating less with his bilateral BKA, has had increasing urinary and bowel incontinence with altered mental status.  However he also has ongoing cellulitic infection.  Unable to get LP due to ongoing infection in lumbar region. ?-Obtain MRI brain to further evaluate ?

## 2021-09-08 NOTE — ED Provider Notes (Signed)
?[facility] DEPT ?Provider Note ? ? ?CSN: [id number] ?Arrival date & time: 09/08/21  1546 ? ?  ? ?History ? ?Chief Complaint  ?Patient presents with  ? Weakness  ? Bed Sores  ? ? ?Scott Reynolds is a 81 y.o. male. ? ?HPI ?81 year old male with a history of diabetes presents via EMS for bedsores.  Paramedic reports that the patient lives by himself and one of his brothers came to check on him today because he has been noticing the patient seems to be getting weaker and weaker through phone calls (and maybe confused) over the last few weeks.  Found the patient to have multiple bedsores, especially on his back and so EMS was called.  Needed to him to have a glucose of around 200 and mildly low blood pressure of around 185 systolic.  Patient reports that normally his other brother and a cousin, help rolled him but he has been getting progressively weaker during this time. He denies any fevers. ? ?Home Medications ?Prior to Admission medications   ?Medication Sig Start Date End Date Taking? Authorizing Provider  ?aspirin 325 MG tablet Take 325 mg by mouth daily.   Yes [provider]  ?dorzolamide (TRUSOPT) 2 % ophthalmic solution Place 1 drop into both eyes 2 (two) times daily. 01/18/13  Yes [provider]  ?losartan (COZAAR) 25 MG tablet TAKE 1 TABLET BY MOUTH TWICE A DAY ?Patient taking differently: Take 25 mg by mouth daily. 08/24/21  Yes Elayne Snare, MD  ?prednisoLONE acetate (PRED FORTE) 1 % ophthalmic suspension Place 1 drop into both eyes 4 (four) times daily. 01/05/14  Yes [provider]  ?simvastatin (ZOCOR) 20 MG tablet TAKE 1 TABLET BY MOUTH EVERYDAY AT BEDTIME ?Patient taking differently: Take 20 mg by mouth at bedtime. 06/24/21  Yes Elayne Snare, MD  ?Blood Glucose Monitoring Suppl ([place]) w/Device KIT 1 each by Does not apply route daily. Use OneTouch Verio Flex meter to check blood sugar once daily.    [provider]   ?COVID-19 mRNA Vac-TriS, Pfizer, SUSP injection Inject into the muscle. 10/27/20   Carlyle Basques, MD  ?glipiZIDE (GLUCOTROL XL) 2.5 MG 24 hr tablet Take 1 tablet (2.5 mg total) by mouth daily with breakfast. ?Patient not taking: Reported on 09/08/2021 07/02/20   Elayne Snare, MD  ?glucose blood test strip 1 each by Other route daily. Use OneTouch Verio test strips as instructed to check blood sugar once daily. 01/20/19   Elayne Snare, MD  ?metFORMIN (GLUCOPHAGE) 1000 MG tablet TAKE 1 TABLET BY MOUTH TWICE A DAY 02/24/21   Elayne Snare, MD  ?   ? ?Allergies    ?Patient has no known allergies.   ? ?Review of Systems   ?Review of Systems  ?Constitutional:  Positive for fatigue. Negative for fever.  ?Skin:  Positive for wound.  ?Neurological:  Positive for weakness.  ? ?Physical Exam ?Updated Vital Signs ?BP (!) 120/46   Pulse 70   Temp (!) 100.9 ?F (38.3 ?C) (Rectal)   Resp 17   Ht '5\' 8"'  (1.727 m)   Wt 93.4 kg   SpO2 94%   BMI 31.31 kg/m?  ?Physical Exam ?Vitals and nursing note reviewed. Exam conducted with a chaperone present.  ?Constitutional:   ?   Appearance: He is well-developed.  ?HENT:  ?   Head: Normocephalic and atraumatic.  ?Cardiovascular:  ?   Rate and Rhythm: Normal rate and regular rhythm.  ?   Heart sounds: Normal heart  sounds.  ?Pulmonary:  ?   Effort: Pulmonary effort is normal.  ?   Breath sounds: Normal breath sounds.  ?Abdominal:  ?   General: There is no distension.  ?   Palpations: Abdomen is soft.  ?   Tenderness: There is no abdominal tenderness.  ?Musculoskeletal:  ?   Comments: Bilateral BKA  ?Skin: ?   General: Skin is warm and dry.  ?   Comments: Patient has multiple bedsores, most prominent in the sacral area but is well and is in his midline thoracic back.  He also has some shallow wounds over his scrotum and perineum and 1 on his right proximal thigh ?Right thigh seems to be a little swollen compared to the left with some erythema.  There is also prominent erythema of the wounds on the  back.  ?Neurological:  ?   Mental Status: He is alert.  ?   Comments: Awake, alert, knows it is April 2023 though he is off on the day of the month  ? ? ?ED Results / Procedures / Treatments   ?Labs ?(all labs ordered are listed, but only abnormal results are displayed) ?Labs Reviewed  ?COMPREHENSIVE METABOLIC PANEL - Abnormal; Notable for the following components:  ?    Result Value  ? Potassium 3.0 (*)   ? Glucose, Bld 173 (*)   ? Albumin 3.2 (*)   ? AST 95 (*)   ? ALT 69 (*)   ? Total Bilirubin 1.3 (*)   ? All other components within normal limits  ?CBC WITH DIFFERENTIAL/PLATELET - Abnormal; Notable for the following components:  ? RBC 4.20 (*)   ? Hemoglobin 12.6 (*)   ? HCT 38.5 (*)   ? All other components within normal limits  ?I-STAT CHEM 8, ED - Abnormal; Notable for the following components:  ? Potassium 3.1 (*)   ? Glucose, Bld 169 (*)   ? All other components within normal limits  ?RESP PANEL BY RT-PCR (FLU A&B, COVID) [id number]  ?CULTURE, BLOOD (ROUTINE X 2)  ?CULTURE, BLOOD (ROUTINE X 2)  ?URINE CULTURE  ?LACTIC ACID, PLASMA  ?PROTIME-INR  ?APTT  ?URINALYSIS, ROUTINE W REFLEX MICROSCOPIC  ?CBC  ?COMPREHENSIVE METABOLIC PANEL  ? ? ?EKG ?EKG Interpretation ? ?Date/Time:  Thursday September 08 2021 16:05:15 EDT ?Ventricular Rate:  89 ?PR Interval:  138 ?QRS Duration: 111 ?QT Interval:  390 ?QTC Calculation: 475 ?R Axis:   -71 ?Text Interpretation: Sinus rhythm Left axis deviation Abnormal R-wave progression, late transition Confirmed by Sherwood Gambler [phone]) on 09/08/2021 4:23:28 PM ? ?Radiology ?CT Head Wo Contrast ? ?Result Date: 09/08/2021 ?CLINICAL DATA:  Mental status change, unknown cause EXAM: CT HEAD WITHOUT CONTRAST TECHNIQUE: Contiguous axial images were obtained from the base of the skull through the vertex without intravenous contrast. RADIATION DOSE REDUCTION: This exam was performed according to the departmental dose-optimization program which includes automated exposure control, adjustment of the  mA and/or kV according to patient size and/or use of iterative reconstruction technique. COMPARISON:  None. FINDINGS: Brain: No evidence of acute infarction, hemorrhage, cerebral edema, mass, mass effect, or midline shift. No extra-axial fluid collection. Ventriculomegaly, with some sulcal crowding around the vertex. Vascular: No hyperdense vessel. Skull: Normal. Negative for fracture or focal lesion. Sinuses/Orbits: No acute finding. Status post bilateral lens replacements. Other: The mastoid air cells are well aerated. IMPRESSION: IMPRESSION 1. Ventriculomegaly, with some sulcal crowding around the vertex, as can be seen in the setting of normal pressure hydrocephalus, although diffuse cerebral volume loss  can also appear similar. Correlate with symptoms and consider CSF tap test if clinically indicated. 2. No other acute intracranial process. Electronically Signed   By: Merilyn Baba M.D.   On: 09/08/2021 17:20  ? ?CT ABDOMEN PELVIS W CONTRAST ? ?Result Date: 09/08/2021 ?CLINICAL DATA:  Sepsis. EXAM: CT ABDOMEN AND PELVIS WITH CONTRAST TECHNIQUE: Multidetector CT imaging of the abdomen and pelvis was performed using the standard protocol following bolus administration of intravenous contrast. RADIATION DOSE REDUCTION: This exam was performed according to the departmental dose-optimization program which includes automated exposure control, adjustment of the mA and/or kV according to patient size and/or use of iterative reconstruction technique. CONTRAST:  161m OMNIPAQUE IOHEXOL 300 MG/ML  SOLN COMPARISON:  None. FINDINGS: Lower chest: No acute abnormality. Hepatobiliary: No focal liver abnormality is seen. No gallstones, gallbladder wall thickening, or biliary dilatation. Pancreas: Unremarkable. No pancreatic ductal dilatation or surrounding inflammatory changes. Spleen: Normal in size without focal abnormality. Adrenals/Urinary Tract: There are rounded hypodensities in the inferior kidneys bilaterally which may  represent cysts. There is no hydronephrosis or perinephric fluid. The adrenal glands and bladder are within normal limits. Stomach/Bowel: Stomach is within normal limits. Appendix appears normal. No evidence of bowe

## 2021-09-08 NOTE — ED Notes (Signed)
ED RN and NT in room to reposition pt.  Pt positioned for comfort w/ two pillows placed on left side, two on right and one pillow between legs. Pt tolerated well.  ? ?

## 2021-09-09 ENCOUNTER — Inpatient Hospital Stay (HOSPITAL_COMMUNITY): Payer: HMO

## 2021-09-09 DIAGNOSIS — R7989 Other specified abnormal findings of blood chemistry: Secondary | ICD-10-CM

## 2021-09-09 DIAGNOSIS — G9341 Metabolic encephalopathy: Secondary | ICD-10-CM | POA: Diagnosis not present

## 2021-09-09 DIAGNOSIS — L03818 Cellulitis of other sites: Secondary | ICD-10-CM | POA: Diagnosis not present

## 2021-09-09 DIAGNOSIS — G9389 Other specified disorders of brain: Secondary | ICD-10-CM | POA: Diagnosis not present

## 2021-09-09 LAB — CBC
HCT: 32 % — ABNORMAL LOW (ref 39.0–52.0)
Hemoglobin: 9.9 g/dL — ABNORMAL LOW (ref 13.0–17.0)
MCH: 28.9 pg (ref 26.0–34.0)
MCHC: 30.9 g/dL (ref 30.0–36.0)
MCV: 93.6 fL (ref 80.0–100.0)
Platelets: 208 10*3/uL (ref 150–400)
RBC: 3.42 MIL/uL — ABNORMAL LOW (ref 4.22–5.81)
RDW: 12.5 % (ref 11.5–15.5)
WBC: 8.4 10*3/uL (ref 4.0–10.5)
nRBC: 0 % (ref 0.0–0.2)

## 2021-09-09 LAB — URINALYSIS, ROUTINE W REFLEX MICROSCOPIC
Bilirubin Urine: NEGATIVE
Glucose, UA: NEGATIVE mg/dL
Hgb urine dipstick: NEGATIVE
Ketones, ur: 5 mg/dL — AB
Leukocytes,Ua: NEGATIVE
Nitrite: NEGATIVE
Protein, ur: NEGATIVE mg/dL
Specific Gravity, Urine: >1.046 — ABNORMAL HIGH (ref 1.005–1.030)
pH: 5 (ref 5.0–8.0)

## 2021-09-09 LAB — COMPREHENSIVE METABOLIC PANEL
ALT: 51 U/L — ABNORMAL HIGH (ref 0–44)
AST: 63 U/L — ABNORMAL HIGH (ref 15–41)
Albumin: 2.6 g/dL — ABNORMAL LOW (ref 3.5–5.0)
Alkaline Phosphatase: 49 U/L (ref 38–126)
Anion gap: 8 (ref 5–15)
BUN: 18 mg/dL (ref 8–23)
CO2: 25 mmol/L (ref 22–32)
Calcium: 8.2 mg/dL — ABNORMAL LOW (ref 8.9–10.3)
Chloride: 106 mmol/L (ref 98–111)
Creatinine, Ser: 0.65 mg/dL (ref 0.61–1.24)
GFR, Estimated: >60 mL/min (ref >60)
Glucose, Bld: 136 mg/dL — ABNORMAL HIGH (ref 70–99)
Potassium: 2.6 mmol/L — CL (ref 3.5–5.1)
Sodium: 139 mmol/L (ref 135–145)
Total Bilirubin: 0.9 mg/dL (ref 0.3–1.2)
Total Protein: 5.6 g/dL — ABNORMAL LOW (ref 6.5–8.1)

## 2021-09-09 IMAGING — MR MR HEAD W/O CM
10 series · 48 of 48 positions shown · non-contrast
Comparison: Noncontrast CT head dated 1 day prior

CLINICAL DATA: Altered mental status confusion 3 weeks ago getting
worse

EXAM:
MRI HEAD WITHOUT CONTRAST
TECHNIQUE: Multiplanar, multiecho pulse sequences of the brain and surrounding
structures were obtained without intravenous contrast.

[Series 5: DWI · axial · 3.0mm · 1.36mm/px · z∈[-67,+87]mm · 9 of 106 slices shown (1 of 2)]
[im 1/106]
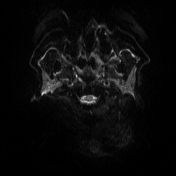
[im 14/106]
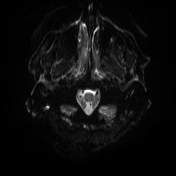
[im 27/106]
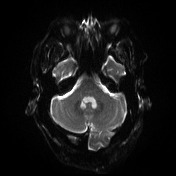
[im 40/106]
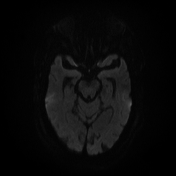
[im 53/106]
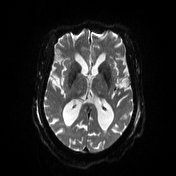
[im 66/106]
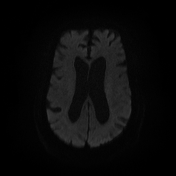
[im 79/106]
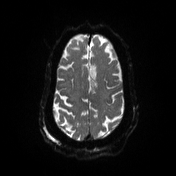
[im 92/106]
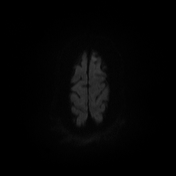
[im 106/106]
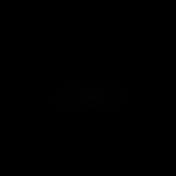

[Series 6: DWI · axial · 3.0mm · 1.36mm/px · z∈[-67,+84]mm · 4 of 53 slices shown (2 of 2)]
[im 1/53]
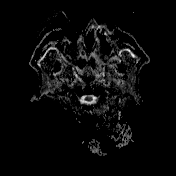
[im 18/53]
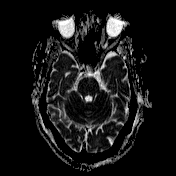
[im 35/53]
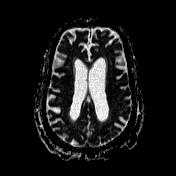
[im 53/53]
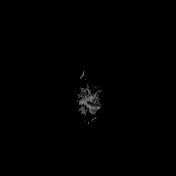

[Series 7: T1 · sagittal · 5.0mm · 0.78mm/px · 2 of 24 slices shown (1 of 2)]
[im 1/24]
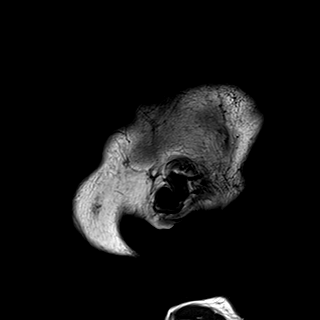
[im 24/24]
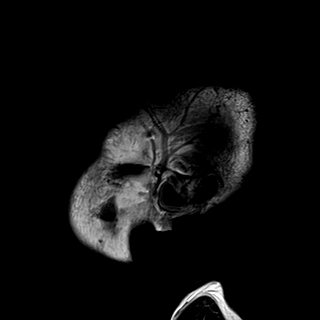

[Series 8: T2 · axial · 5.0mm · 0.62mm/px · z∈[-66,+86]mm · 2 of 25 slices shown (1 of 2)]
[im 1/25]
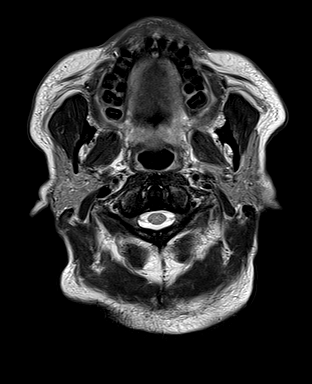
[im 25/25]
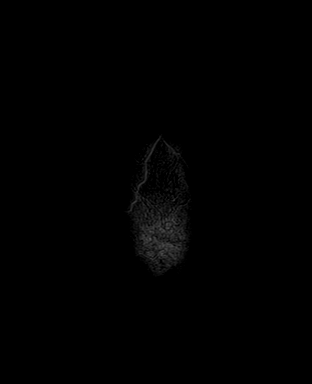

[Series 9: swi_images · axial · 3.0mm · 0.75mm/px · z∈[-64,+84]mm · 4 of 52 slices shown]
[im 1/52]
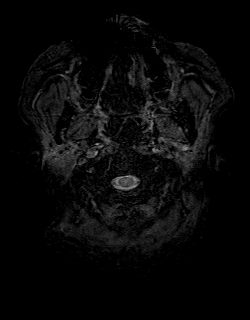
[im 18/52]
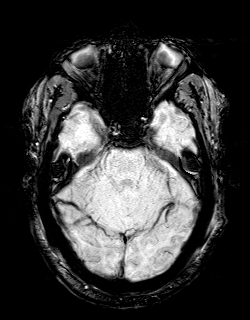
[im 35/52]
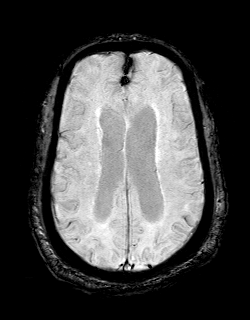
[im 52/52]
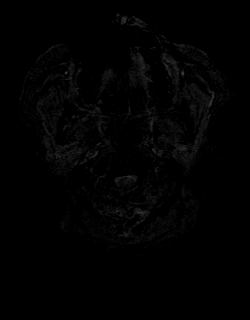

[Series 11: FLAIR · axial · 3.0mm · 0.75mm/px · z∈[-64,+84]mm · 4 of 52 slices shown]
[im 1/52]
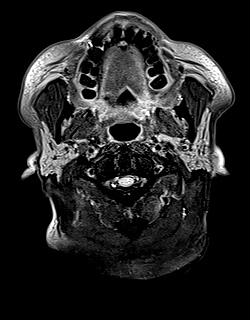
[im 18/52]
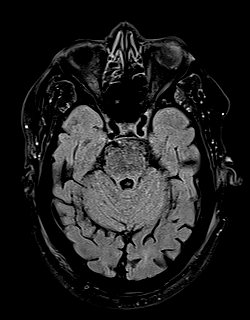
[im 35/52]
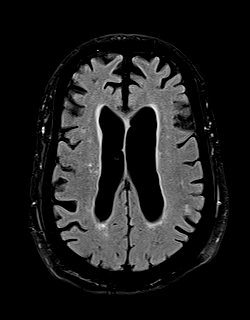
[im 52/52]
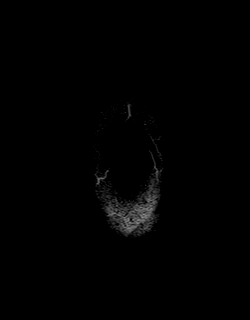

[Series 12: T1 · axial · 1.0mm · 0.94mm/px · z∈[-64,+90]mm · 13 of 160 slices shown (2 of 2)]
[im 1/160]
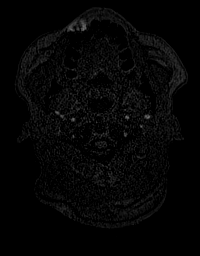
[im 14/160]
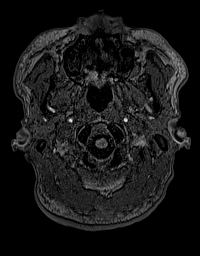
[im 27/160]
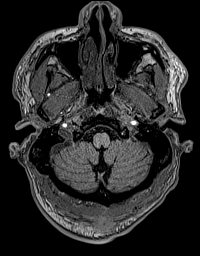
[im 40/160]
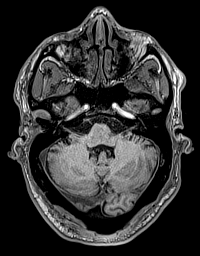
[im 54/160]
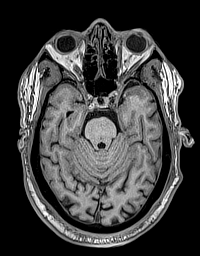
[im 67/160]
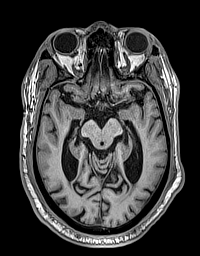
[im 80/160]
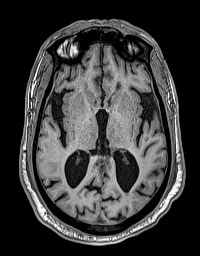
[im 93/160]
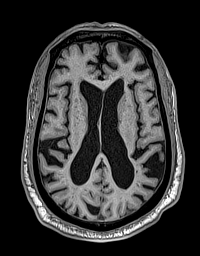
[im 107/160]
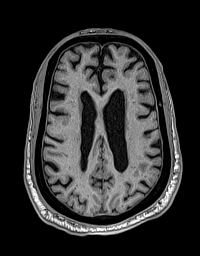
[im 120/160]
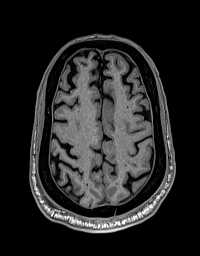
[im 133/160]
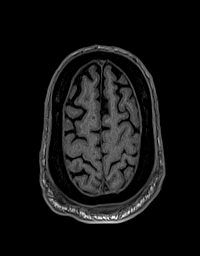
[im 146/160]
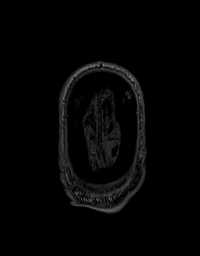
[im 160/160]
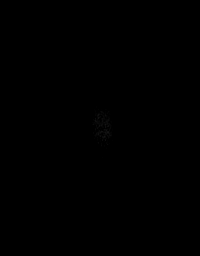

[Series 13: cor dwi_tracew · coronal · 5.0mm · 1.53mm/px · 5 of 62 slices shown]
[im 1/62]
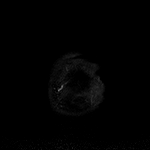
[im 16/62]
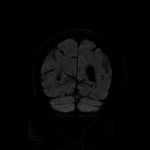
[im 31/62]
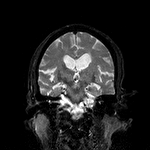
[im 46/62]
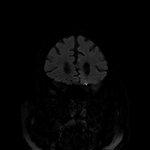
[im 62/62]
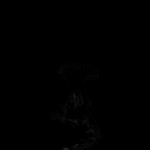

[Series 14: cor dwi_adc · coronal · 5.0mm · 1.53mm/px · 2 of 31 slices shown]
[im 1/31]
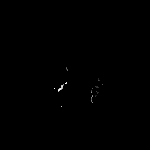
[im 31/31]
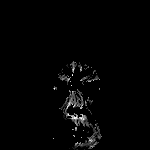

[Series 15: T2 · coronal · 5.0mm · 0.57mm/px · 3 of 39 slices shown (2 of 2)]
[im 1/39]
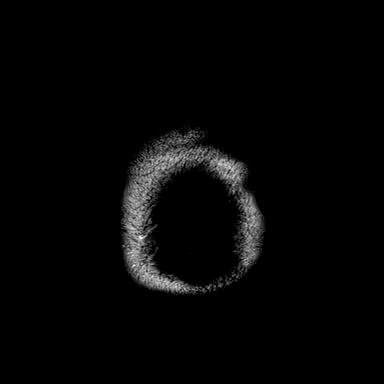
[im 20/39]
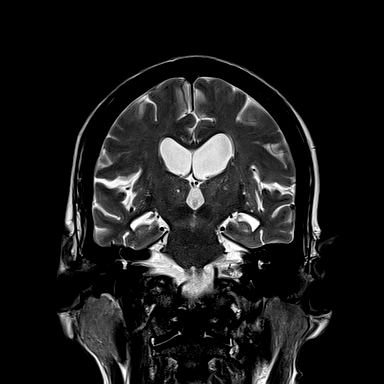
[im 39/39]
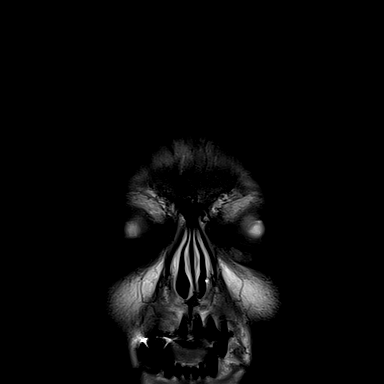

[48 of 48 positions shown; findings below may reference images not displayed]

FINDINGS: Brain: There is no acute intracranial hemorrhage, extra-axial fluid
collection, or acute infarct.

There is mild-to-moderate global parenchymal volume loss with
prominence of the ventricular system and extra-axial CSF spaces.
There is crowding of the gyri at the vertex as seen on prior CT.
Scattered foci of FLAIR signal abnormality in the subcortical and
periventricular white matter likely reflects sequela of mild chronic
white matter microangiopathy.

There is no suspicious parenchymal signal abnormality. There is no
mass lesion. There is no mass effect or midline shift.

Vascular: Normal flow voids.

Skull and upper cervical spine: Marrow signal in the upper cervical
spine is heterogeneous, nonspecific.

Sinuses/Orbits: There is mild mucosal thickening in the paranasal
sinuses. Bilateral lens implants are in place. There is abnormal T2
signal in the prechiasmatic optic nerves, left more than right
(15-30).

Other: None.
IMPRESSION: 1. No acute intracranial pathology.
2. Mild ventriculomegaly is favored ex vacuo in nature due to global
parenchymal volume loss; however, a component of normal pressure
hydrocephalus could have a similar appearance.
3. Abnormal signal in the prechiasmatic optic nerves, left more than
right, may reflect sequela of prior optic neuritis.

## 2021-09-09 MED ORDER — FOLIC ACID 1 MG PO TABS
1.0000 mg | ORAL_TABLET | Freq: Every day | ORAL | Status: DC
  Administered 2021-09-10 – 2021-09-13 (×4): 1 mg via ORAL
  Filled 2021-09-09 (×4): qty 1

## 2021-09-09 MED ORDER — ENSURE ENLIVE PO LIQD
237.0000 mL | Freq: Two times a day (BID) | ORAL | Status: DC
  Administered 2021-09-09: 237 mL via ORAL

## 2021-09-09 MED ORDER — BISACODYL 10 MG RE SUPP: 10.0000 mg | Freq: Every day | RECTAL | Status: DC | PRN

## 2021-09-09 MED ORDER — POTASSIUM CHLORIDE 10 MEQ/100ML IV SOLN
10.0000 meq | INTRAVENOUS | Status: AC
  Administered 2021-09-09 (×6): 10 meq via INTRAVENOUS
  Filled 2021-09-09 (×4): qty 100

## 2021-09-09 MED ORDER — SENNOSIDES-DOCUSATE SODIUM 8.6-50 MG PO TABS
1.0000 | ORAL_TABLET | Freq: Two times a day (BID) | ORAL | Status: DC
  Administered 2021-09-09 – 2021-09-12 (×6): 1 via ORAL
  Filled 2021-09-09 (×7): qty 1

## 2021-09-09 MED ORDER — POTASSIUM CHLORIDE CRYS ER 20 MEQ PO TBCR
40.0000 meq | EXTENDED_RELEASE_TABLET | Freq: Once | ORAL | Status: AC
  Administered 2021-09-09: 40 meq via ORAL
  Filled 2021-09-09: qty 2

## 2021-09-09 NOTE — ED Notes (Signed)
Pt to MRI at this time.

## 2021-09-09 NOTE — Consult Note (Signed)
WOC Nurse Consult Note: ?Reason for Consult: pressure injuries ?Patient bilateral BKA, not currently using prothesis, reports them to be "worn out" noted previously had issues with ill fitting prothesis, but no wounds on either stump as this time.  ?Patient has essentially been bedbound, reports not being incontinent but once urinated or had a BM not able to get up, therefore he was lying in urine and feces at times.  He was limited in his ability to assist with turning today in the bed, which would make me more concerned that he is not moving at home either.  ?Wound type: ?Vertebral Stage 2 Pressure Injuries; from midthoracic to lumbar area; partial thickness ?Right shoulder Stage 2 Pressure Injury; partial thickness ?Right flank Stage 2 Pressure Injury; partial thickness  ?Unstageable Pressure Injury; gluteal cleft; ?Abrasion; right cheek; healing ?Pressure Injury POA: Yes ?Measurement: ?Vertebral: 12cm x 3cm x 0.1cm  ?Right shoulder: 4cm x 3cm x 0.1cm  ?Right flank: 3cm x 2cm x 0.1cm  ?Gluteal cleft: 5cm x 6cm x 0.2cm  ?Wound bed: ?Vertebral/right shoulder/right flank; 100% pink, partial thickness, clean  ?Gluteal cleft 50% pink, some fibrin/50% thicker fibrinous/dark tissue at the right lateral aspect  ?Right cheek; scabbed; evidenced of larger wound due to lack of melanin  ?Drainage (amount, consistency, odor) minimal; serous.  ?NOTED: patient has mucous, large amount, clear in color all over his buttocks, unclear where this coming from.  No recent use of suppository? ED nurse with no explanation. Hospitalist at the bedside, requested him to take images; did not take pictures.  ? ?Periwound: intact ?Dressing procedure/placement/frequency: ?Add air mattress for moisture management and pressure redistribution. Requested Financial controller order ?Silicone foam for now, really at the stage the pressure injuries are no other topical care needed; the buttock wound may need enzymatic debridement at some point  ?RD for  wound healing; maximize nutrition ? ? ?Discussed POC with patient and bedside nurse.  ?Re consult if needed, will not follow at this time. ?Thanks ? Amoni Morales [facility] MSN, RN,CWOCN, CNS, CWON-AP [phone])  ? ? ? ?  ?

## 2021-09-09 NOTE — ED Notes (Signed)
Patient cleaned with soap and water.  Mepilex foam dressings placed to area of skin breakdown noted to back, bilateral shoulders, and buttocks.  Barrier cream and ABD pads placed to groin and between legs to prevent additional skin breakdown.  Pillows placed under patient's R side ?

## 2021-09-09 NOTE — ED Notes (Signed)
Patient provided with water and assisted to drink.  No difficulty with swallowing noted.   ?

## 2021-09-09 NOTE — Progress Notes (Signed)
?PROGRESS NOTE ? ? ? ?Scott Reynolds  YDX:[id number] DOB: 16-Feb-1941 DOA: 09/08/2021 ?PCP: Adron Bene, MD  ? ?Brief Narrative:  ? 81 y.o. 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  On presentation, he had a temperature of 100.9, AST of 95, ALT of 69, total bili of 1.3.  CT of the head showed ventriculomegaly concerning for normal pressure hydrocephalus with no other acute intracranial process.  CT of the right femur showed no acute fracture but findings suggestive of mild cellulitis.  CT of abdomen and pelvis showed subcutaneous edema overlying sacral and coccyx area representing cellulitis, small tract from left anus extending inferiorly into the skin of the left buttocks but no evidence of soft tissue gas or drainable fluid.  He was started on broad-spectrum antibiotics. ? ?Assessment & Plan: ?  ?Cellulitis ?-Scattered pressure ulcers throughout the body including bilateral posterior shoulder, mid lumbar spine, buttocks and gluteal cleft and medial groin.  Mid lumbar spine pressure ulcer most apparent to have active infection with purulent drainage ?-Continue broad-spectrum antibiotics ?-Wound care consultation pending. ? ?Acute metabolic encephalopathy ?-Possibly from above.  CT of the head showed ventriculomegaly concerning for NPH but no other acute intracranial process. ?-Follow MRI of brain ? ?Cerebral ventriculomegaly ?-Plan as above. ? ?Abnormal LFTs ?-Improving.  Monitor ? ?Hypokalemia ?-Replace.  Repeat a.m. labs ? ?Normocytic anemia ?-Questionable cause.  Hemoglobin stable.  Monitor ? ?Status post bilateral below-knee amputation ?-At baseline can ambulate with prosthetics.  PT eval ? ?Constipation ?-Large stool burden noted on CT of the abdomen.  Continue daily MiraLAX.  Add Senokot as well. ? ?Glaucoma ?-Continue home eyedrops ? ?Hypertension ?-Blood pressure  intermittently on the high side.  Continue losartan ? ?Hyperlipidemia--continue simvastatin ? ?Generalized deconditioning ?Goals of care ?-Overall prognosis is guarded to poor.  PT eval.  Palliative care consultation for goals of care discussion. ? ? ?DVT prophylaxis: Lovenox ?Code Status: Full ?Family Communication: None at bedside ?Disposition Plan: ?Status is: Inpatient ?Remains inpatient appropriate because: Of need for IV antibiotics. ? ? ? ?Consultants: None ? ?Procedures: None ? ?Antimicrobials:  ?Anti-infectives (From admission, onward)  ? ? Start     Dose/Rate Route Frequency Ordered Stop  ? 09/09/21 1700  vancomycin (VANCOCIN) IVPB 1000 mg/200 mL premix       ? 1,000 mg ?200 mL/hr over 60 Minutes Intravenous Every 24 hours 09/08/21 2048    ? 09/09/21 0500  metroNIDAZOLE (FLAGYL) IVPB 500 mg       ? 500 mg ?100 mL/hr over 60 Minutes Intravenous Every 12 hours 09/08/21 2031    ? 09/09/21 0000  cefTRIAXone (ROCEPHIN) 1 g in sodium chloride 0.9 % 100 mL IVPB       ? 1 g ?200 mL/hr over 30 Minutes Intravenous Every 24 hours 09/08/21 2031    ? 09/08/21 2045  cefTRIAXone (ROCEPHIN) 1 g in sodium chloride 0.9 % 100 mL IVPB  Status:  Discontinued       ? 1 g ?200 mL/hr over 30 Minutes Intravenous Every 24 hours 09/08/21 2030 09/08/21 2031  ? 09/08/21 1630  vancomycin (VANCOREADY) IVPB 1750 mg/350 mL       ? 1,750 mg ?175 mL/hr over 120 Minutes Intravenous  Once 09/08/21 1615 09/08/21 2024  ? 09/08/21 1615  ceFEPIme (MAXIPIME) 2 g in sodium chloride 0.9 % 100 mL IVPB       ? 2 g ?200 mL/hr over  30 Minutes Intravenous  Once 09/08/21 1609 09/08/21 1702  ? 09/08/21 1615  metroNIDAZOLE (FLAGYL) IVPB 500 mg       ? 500 mg ?100 mL/hr over 60 Minutes Intravenous  Once 09/08/21 1609 09/08/21 1735  ? 09/08/21 1615  vancomycin (VANCOCIN) IVPB 1000 mg/200 mL premix  Status:  Discontinued       ? 1,000 mg ?200 mL/hr over 60 Minutes Intravenous  Once 09/08/21 1609 09/08/21 1615  ? ?  ? ? ? ?Subjective: ?Patient seen and examined  at bedside.  Poor historian.  No overnight fever, seizures, vomiting reported. ? ?Objective: ?Vitals:  ? 09/09/21 0700 09/09/21 0730 09/09/21 0810 09/09/21 0900  ?BP: (!) 125/48 (!) 121/47 (!) 151/96 (!) 124/48  ?Pulse: 74 74 62 73  ?Resp: 16 10 16 11   ?Temp:   98.1 ?F (36.7 ?C)   ?TempSrc:   Oral   ?SpO2: 97% 98% 100% 97%  ?Weight:      ?Height:      ? ? ?Intake/Output Summary (Last 24 hours) at 09/09/2021 1035 ?Last data filed at 09/09/2021 0701 ?Gross per 24 hour  ?Intake 750 ml  ?Output --  ?Net 750 ml  ? ?Filed Weights  ? 09/08/21 1606  ?Weight: 93.4 kg  ? ? ?Examination: ? ?General exam: Appears calm and comfortable.  Looks chronically ill and deconditioned. ?Respiratory system: Bilateral decreased breath sounds at bases with some scattered crackles ?Cardiovascular system: S1 & S2 heard, Rate controlled ?Gastrointestinal system: Abdomen is nondistended, soft and nontender. Normal bowel sounds heard. ?Extremities: No cyanosis, clubbing; bilateral BKA present  ?Central nervous system: Slow to respond. Awake, answers some questions. No focal neurological deficits. Moving extremities ?Skin: pressure ulcers with sloughing of skin and exposure of subcutaneous tissue seen on proximal posterior shoulder, midline lumbar spine with purulent drainage, proximal buttocks, gluteal cleft, right mid back paraspinal, medial groin region ?Psychiatry: Flat affect.  No signs of agitation. ? ? ? ?Data Reviewed: I have personally reviewed following labs and imaging studies ? ?CBC: ?Recent Labs  ?Lab 09/08/21 ?1625 09/08/21 ?1642 09/09/21 ?09/11/21  ?WBC 8.6  --  8.4  ?NEUTROABS 7.0  --   --   ?HGB 12.6* 13.3 9.9*  ?HCT 38.5* 39.0 32.0*  ?MCV 91.7  --  93.6  ?PLT 231  --  208  ? ?Basic Metabolic Panel: ?Recent Labs  ?Lab 09/08/21 ?1625 09/08/21 ?1642 09/09/21 ?09/11/21  ?NA 140 139 139  ?K 3.0* 3.1* 2.6*  ?CL 104 99 106  ?CO2 28  --  25  ?GLUCOSE 173* 169* 136*  ?BUN 23 22 18   ?CREATININE 0.94 1.00 0.65  ?CALCIUM 8.9  --  8.2*   ? ?GFR: ?Estimated Creatinine Clearance: 80.3 mL/min (by C-G formula based on SCr of 0.65 mg/dL). ?Liver Function Tests: ?Recent Labs  ?Lab 09/08/21 ?1625 09/09/21 ?09/10/21  ?AST 95* 63*  ?ALT 69* 51*  ?ALKPHOS 65 49  ?BILITOT 1.3* 0.9  ?PROT 7.2 5.6*  ?ALBUMIN 3.2* 2.6*  ? ?No results for input(s): LIPASE, AMYLASE in the last 168 hours. ?No results for input(s): AMMONIA in the last 168 hours. ?Coagulation Profile: ?Recent Labs  ?Lab 09/08/21 ?1625  ?INR 1.1  ? ?Cardiac Enzymes: ?No results for input(s): CKTOTAL, CKMB, CKMBINDEX, TROPONINI in the last 168 hours. ?BNP (last 3 results) ?No results for input(s): PROBNP in the last 8760 hours. ?HbA1C: ?No results for input(s): HGBA1C in the last 72 hours. ?CBG: ?No results for input(s): GLUCAP in the last 168 hours. ?Lipid Profile: ?No results for input(s):  CHOL, HDL, LDLCALC, TRIG, CHOLHDL, LDLDIRECT in the last 72 hours. ?Thyroid Function Tests: ?No results for input(s): TSH, T4TOTAL, FREET4, T3FREE, THYROIDAB in the last 72 hours. ?Anemia Panel: ?No results for input(s): VITAMINB12, FOLATE, FERRITIN, TIBC, IRON, RETICCTPCT in the last 72 hours. ?Sepsis Labs: ?Recent Labs  ?Lab 09/08/21 ?1624  ?LATICACIDVEN 1.5  ? ? ?Recent Results (from the past 240 hour(s))  ?Blood Culture (routine x 2)     Status: None (Preliminary result)  ? Collection Time: 09/08/21  4:00 PM  ? Specimen: BLOOD  ?Result Value Ref Range Status  ? Specimen Description   Final  ?  BLOOD RIGHT ANTECUBITAL ?Performed at [facility], [address] [place], [place] [address] ?  ? Special Requests   Final  ?  BOTTLES DRAWN AEROBIC AND ANAEROBIC Blood Culture adequate volume ?Performed at [facility], [address] [place], [place] [address] ?  ? Culture   Final  ?  NO GROWTH < 12 HOURS ?Performed at [facility], [address] [place], [place] [address] ?  ? Report Status PENDING  Incomplete  ?Blood Culture (routine x 2)     Status: None (Preliminary  result)  ? Collection Time: 09/08/21  4:15 PM  ? Specimen: BLOOD  ?Result Value Ref Range Status  ? Specimen Description   Final  ?  BLOOD RIGHT ANTECUBITAL ?Performed at [facility], [address]

## 2021-09-10 DIAGNOSIS — G9389 Other specified disorders of brain: Secondary | ICD-10-CM | POA: Diagnosis not present

## 2021-09-10 DIAGNOSIS — R7989 Other specified abnormal findings of blood chemistry: Secondary | ICD-10-CM | POA: Diagnosis not present

## 2021-09-10 DIAGNOSIS — Z7189 Other specified counseling: Secondary | ICD-10-CM | POA: Diagnosis not present

## 2021-09-10 DIAGNOSIS — L03317 Cellulitis of buttock: Secondary | ICD-10-CM

## 2021-09-10 DIAGNOSIS — G9341 Metabolic encephalopathy: Secondary | ICD-10-CM | POA: Diagnosis not present

## 2021-09-10 DIAGNOSIS — Z89512 Acquired absence of left leg below knee: Secondary | ICD-10-CM | POA: Diagnosis not present

## 2021-09-10 DIAGNOSIS — L03818 Cellulitis of other sites: Secondary | ICD-10-CM | POA: Diagnosis not present

## 2021-09-10 DIAGNOSIS — Z515 Encounter for palliative care: Secondary | ICD-10-CM

## 2021-09-10 LAB — CBC WITH DIFFERENTIAL/PLATELET
Abs Immature Granulocytes: 0.02 10*3/uL (ref 0.00–0.07)
Basophils Absolute: 0 10*3/uL (ref 0.0–0.1)
Basophils Relative: 0 %
Eosinophils Absolute: 0.1 10*3/uL (ref 0.0–0.5)
Eosinophils Relative: 1 %
HCT: 31.8 % — ABNORMAL LOW (ref 39.0–52.0)
Hemoglobin: 10.1 g/dL — ABNORMAL LOW (ref 13.0–17.0)
Immature Granulocytes: 0 %
Lymphocytes Relative: 12 %
Lymphs Abs: 0.8 10*3/uL (ref 0.7–4.0)
MCH: 29.5 pg (ref 26.0–34.0)
MCHC: 31.8 g/dL (ref 30.0–36.0)
MCV: 93 fL (ref 80.0–100.0)
Monocytes Absolute: 0.5 10*3/uL (ref 0.1–1.0)
Monocytes Relative: 7 %
Neutro Abs: 5.2 10*3/uL (ref 1.7–7.7)
Neutrophils Relative %: 80 %
Platelets: 168 10*3/uL (ref 150–400)
RBC: 3.42 MIL/uL — ABNORMAL LOW (ref 4.22–5.81)
RDW: 12.4 % (ref 11.5–15.5)
WBC: 6.7 10*3/uL (ref 4.0–10.5)
nRBC: 0 % (ref 0.0–0.2)

## 2021-09-10 LAB — COMPREHENSIVE METABOLIC PANEL
ALT: 40 U/L (ref 0–44)
AST: 42 U/L — ABNORMAL HIGH (ref 15–41)
Albumin: 2.5 g/dL — ABNORMAL LOW (ref 3.5–5.0)
Alkaline Phosphatase: 52 U/L (ref 38–126)
Anion gap: 7 (ref 5–15)
BUN: 15 mg/dL (ref 8–23)
CO2: 26 mmol/L (ref 22–32)
Calcium: 8.2 mg/dL — ABNORMAL LOW (ref 8.9–10.3)
Chloride: 107 mmol/L (ref 98–111)
Creatinine, Ser: 0.79 mg/dL (ref 0.61–1.24)
GFR, Estimated: >60 mL/min (ref >60)
Glucose, Bld: 115 mg/dL — ABNORMAL HIGH (ref 70–99)
Potassium: 3.3 mmol/L — ABNORMAL LOW (ref 3.5–5.1)
Sodium: 140 mmol/L (ref 135–145)
Total Bilirubin: 1 mg/dL (ref 0.3–1.2)
Total Protein: 5.6 g/dL — ABNORMAL LOW (ref 6.5–8.1)

## 2021-09-10 LAB — VITAMIN B12: Vitamin B-12: 2489 pg/mL — ABNORMAL HIGH (ref 180–914)

## 2021-09-10 LAB — URINE CULTURE: Culture: NO GROWTH

## 2021-09-10 LAB — AMMONIA: Ammonia: 28 umol/L (ref 9–35)

## 2021-09-10 LAB — TSH: TSH: 1.558 u[IU]/mL (ref 0.350–4.500)

## 2021-09-10 LAB — MAGNESIUM: Magnesium: 2.2 mg/dL (ref 1.7–2.4)

## 2021-09-10 MED ORDER — GERHARDT'S BUTT CREAM
TOPICAL_CREAM | Freq: Four times a day (QID) | CUTANEOUS | Status: DC
  Administered 2021-09-12: 1 via TOPICAL
  Filled 2021-09-10: qty 1

## 2021-09-10 MED ORDER — POTASSIUM CHLORIDE CRYS ER 20 MEQ PO TBCR
40.0000 meq | EXTENDED_RELEASE_TABLET | Freq: Once | ORAL | Status: AC
  Administered 2021-09-10: 40 meq via ORAL
  Filled 2021-09-10: qty 2

## 2021-09-10 NOTE — Progress Notes (Signed)
?PROGRESS NOTE ? ? ? ?Scott Reynolds  [id number] DOB: 1940-12-29 DOA: 09/08/2021 ?PCP: Delene Ruffini, MD  ? ?Brief Narrative:  ? 81 y.o. 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  On presentation, he had a temperature of 100.9, AST of 95, ALT of 69, total bili of 1.3.  CT of the head showed ventriculomegaly concerning for normal pressure hydrocephalus with no other acute intracranial process.  CT of the right femur showed no acute fracture but findings suggestive of mild cellulitis.  CT of abdomen and pelvis showed subcutaneous edema overlying sacral and coccyx area representing cellulitis, small tract from left anus extending inferiorly into the skin of the left buttocks but no evidence of soft tissue gas or drainable fluid.  He was started on broad-spectrum antibiotics. ? ?Assessment & Plan: ?  ?Cellulitis ?Various stages of pressure injuries present on admission: Vertebral stage II pressure injuries from mid thoracic to lumbar area; right shoulder stage II pressure injury; right flank stage II pressure injuries; gluteal cleft unstageable pressure injury: Present on admission ?-Continue broad-spectrum antibiotics ?-Follow wound care consultation recommendations. ? ?Acute metabolic encephalopathy ?-Possibly from above.  CT of the head showed ventriculomegaly concerning for NPH but no other acute intracranial process. ?-MRI brain as below. ?-Monitor mental status.  Fall precautions.  PT evaluation ?-Ammonia 28, TSH 1.558.  Follow vitamin B12 levels. ? ?Cerebral ventriculomegaly ?-MRI of the brain showed mild ventriculomegaly; NPH can have similar appearance.  Discussed MRI findings with neurology/Dr. Rory Percy via secure chat on 09/09/2021: He recommended that patient needs to follow-up with neurosurgery as an outpatient regarding the same.   ? ?Abnormal LFTs ?-Improving.   Monitor ? ?Hypokalemia ?-Replace.  Repeat a.m. labs ? ?Normocytic anemia ?-Questionable cause.  Hemoglobin stable.  Monitor ? ?Status post bilateral below-knee amputation ?-At baseline can ambulate with prosthetics.  PT eval ? ?Constipation ?-Large stool burden noted on CT of the abdomen.  Continue daily MiraLAX and Senokot. ? ?Glaucoma ?-Continue home eyedrops ? ?Hypertension ?-Blood pressure currently stable.  Continue losartan ? ?Hyperlipidemia--continue simvastatin ? ?Generalized deconditioning ?Goals of care ?-Overall prognosis is guarded to poor.  PT eval.  Palliative care consultation for goals of care discussion. ? ? ? ?DVT prophylaxis: Lovenox ?Code Status: Full ?Family Communication: Brother at bedside ?Disposition Plan: ?Status is: Inpatient ?Remains inpatient appropriate because: Of need for IV antibiotics.  PT eval pending ? ? ? ?Consultants: Palliative care consultation pending. ? ?Procedures: None ? ?Antimicrobials:  ?Anti-infectives (From admission, onward)  ? ? Start     Dose/Rate Route Frequency Ordered Stop  ? 09/09/21 1700  vancomycin (VANCOCIN) IVPB 1000 mg/200 mL premix       ? 1,000 mg ?200 mL/hr over 60 Minutes Intravenous Every 24 hours 09/08/21 2048    ? 09/09/21 0500  metroNIDAZOLE (FLAGYL) IVPB 500 mg       ? 500 mg ?100 mL/hr over 60 Minutes Intravenous Every 12 hours 09/08/21 2031    ? 09/09/21 0000  cefTRIAXone (ROCEPHIN) 1 g in sodium chloride 0.9 % 100 mL IVPB       ? 1 g ?200 mL/hr over 30 Minutes Intravenous Every 24 hours 09/08/21 2031    ? 09/08/21 2045  cefTRIAXone (ROCEPHIN) 1 g in sodium chloride 0.9 % 100 mL IVPB  Status:  Discontinued       ? 1 g ?200 mL/hr over 30 Minutes Intravenous Every 24 hours 09/08/21 2030  09/08/21 2031  ? 09/08/21 1630  vancomycin (VANCOREADY) IVPB 1750 mg/350 mL       ? 1,750 mg ?175 mL/hr over 120 Minutes Intravenous  Once 09/08/21 1615 09/08/21 2024  ? 09/08/21 1615  ceFEPIme (MAXIPIME) 2 g in sodium chloride 0.9 % 100 mL IVPB       ? 2 g ?200  mL/hr over 30 Minutes Intravenous  Once 09/08/21 1609 09/08/21 1702  ? 09/08/21 1615  metroNIDAZOLE (FLAGYL) IVPB 500 mg       ? 500 mg ?100 mL/hr over 60 Minutes Intravenous  Once 09/08/21 1609 09/08/21 1735  ? 09/08/21 1615  vancomycin (VANCOCIN) IVPB 1000 mg/200 mL premix  Status:  Discontinued       ? 1,000 mg ?200 mL/hr over 60 Minutes Intravenous  Once 09/08/21 1609 09/08/21 1615  ? ?  ? ? ? ?Subjective: ?Patient seen and examined at bedside.  Poor historian.  No agitation, fever, vomiting, seizures reported. ?Objective: ?Vitals:  ? 09/09/21 1406 09/09/21 1908 09/09/21 2148 09/10/21 0234  ?BP: (!) 112/46 (!) 116/48 (!) 111/48 (!) 115/50  ?Pulse: 74 69 66 69  ?Resp: 18 18 18 18   ?Temp: 99.3 ?F (37.4 ?C) 99.1 ?F (37.3 ?C) 98.7 ?F (37.1 ?C) 98.8 ?F (37.1 ?C)  ?TempSrc: Oral Oral Oral Oral  ?SpO2: 97% 97% 96% 99%  ?Weight:      ?Height:      ? ? ?Intake/Output Summary (Last 24 hours) at 09/10/2021 0757 ?Last data filed at 09/10/2021 0100 ?Gross per 24 hour  ?Intake 240 ml  ?Output 500 ml  ?Net -260 ml  ? ? ?Filed Weights  ? 09/08/21 1606  ?Weight: 93.4 kg  ? ? ?Examination: ? ?General: On room air.  No distress.  Chronically ill and deconditioned looking. ?ENT/neck: No thyromegaly.  JVD is not elevated  ?respiratory: Decreased breath sounds at bases bilaterally with some crackles; no wheezing  ?CVS: S1-S2 heard, rate controlled currently ?Abdominal: Soft, nontender, slightly distended; no organomegaly, normal bowel sounds are heard ?Extremities: Trace lower extremity edema; no cyanosis.  Bilateral BKA present ?CNS: More awake this morning; still slightly slow to respond.  Answers some questions.  No focal neurologic deficit.  Moves extremities ?Lymph: No obvious lymphadenopathy ?Skin: No obvious ecchymosis/petechiae  ?psych: Mostly flat affect.  No signs of agitation. ?musculoskeletal: No obvious joint swelling/deformity ? ? ? ? ?Data Reviewed: I have personally reviewed following labs and imaging  studies ? ?CBC: ?Recent Labs  ?Lab 09/08/21 ?1625 09/08/21 ?1642 09/09/21 ?[id number] 09/10/21 ?0533  ?WBC 8.6  --  8.4 6.7  ?NEUTROABS 7.0  --   --  5.2  ?HGB 12.6* 13.3 9.9* 10.1*  ?HCT 38.5* 39.0 32.0* 31.8*  ?MCV 91.7  --  93.6 93.0  ?PLT 231  --  208 168  ? ? ?Basic Metabolic Panel: ?Recent Labs  ?Lab 09/08/21 ?1625 09/08/21 ?1642 09/09/21 ?[id number] 09/10/21 ?0533  ?NA 140 139 139 140  ?K 3.0* 3.1* 2.6* 3.3*  ?CL 104 99 106 107  ?CO2 28  --  25 26  ?GLUCOSE 173* 169* 136* 115*  ?BUN 23 22 18 15   ?CREATININE 0.94 1.00 0.65 0.79  ?CALCIUM 8.9  --  8.2* 8.2*  ?MG  --   --   --  2.2  ? ? ?GFR: ?Estimated Creatinine Clearance: 80.3 mL/min (by C-G formula based on SCr of 0.79 mg/dL). ?Liver Function Tests: ?Recent Labs  ?Lab 09/08/21 ?1625 09/09/21 ?[id number] 09/10/21 ?0533  ?AST 95* 63* 42*  ?ALT 69* 51* 40  ?  ALKPHOS 65 49 52  ?BILITOT 1.3* 0.9 1.0  ?PROT 7.2 5.6* 5.6*  ?ALBUMIN 3.2* 2.6* 2.5*  ? ? ?No results for input(s): LIPASE, AMYLASE in the last 168 hours. ?Recent Labs  ?Lab 09/10/21 ?0541  ?AMMONIA 28  ? ?Coagulation Profile: ?Recent Labs  ?Lab 09/08/21 ?1625  ?INR 1.1  ? ? ?Cardiac Enzymes: ?No results for input(s): CKTOTAL, CKMB, CKMBINDEX, TROPONINI in the last 168 hours. ?BNP (last 3 results) ?No results for input(s): PROBNP in the last 8760 hours. ?HbA1C: ?No results for input(s): HGBA1C in the last 72 hours. ?CBG: ?No results for input(s): GLUCAP in the last 168 hours. ?Lipid Profile: ?No results for input(s): CHOL, HDL, LDLCALC, TRIG, CHOLHDL, LDLDIRECT in the last 72 hours. ?Thyroid Function Tests: ?Recent Labs  ?  09/10/21 ?0533  ?TSH 1.558  ? ?Anemia Panel: ?No results for input(s): VITAMINB12, FOLATE, FERRITIN, TIBC, IRON, RETICCTPCT in the last 72 hours. ?Sepsis Labs: ?Recent Labs  ?Lab 09/08/21 ?1624  ?LATICACIDVEN 1.5  ? ? ? ?Recent Results (from the past 240 hour(s))  ?Blood Culture (routine x 2)     Status: None (Preliminary result)  ? Collection Time: 09/08/21  4:00 PM  ? Specimen: BLOOD  ?Result Value Ref  Range Status  ? Specimen Description   Final  ?  BLOOD RIGHT ANTECUBITAL ?Performed at [facility], [place] [address], [place], [place] [address] ?  ? Special Requests   Final  ?  BOTTLES DRAWN AEROBIC AND ANA

## 2021-09-11 DIAGNOSIS — G9389 Other specified disorders of brain: Secondary | ICD-10-CM | POA: Diagnosis not present

## 2021-09-11 DIAGNOSIS — G9341 Metabolic encephalopathy: Secondary | ICD-10-CM | POA: Diagnosis not present

## 2021-09-11 DIAGNOSIS — R7989 Other specified abnormal findings of blood chemistry: Secondary | ICD-10-CM | POA: Diagnosis not present

## 2021-09-11 DIAGNOSIS — L03818 Cellulitis of other sites: Secondary | ICD-10-CM | POA: Diagnosis not present

## 2021-09-11 LAB — COMPREHENSIVE METABOLIC PANEL
ALT: 33 U/L (ref 0–44)
AST: 30 U/L (ref 15–41)
Albumin: 2.4 g/dL — ABNORMAL LOW (ref 3.5–5.0)
Alkaline Phosphatase: 52 U/L (ref 38–126)
Anion gap: 7 (ref 5–15)
BUN: 15 mg/dL (ref 8–23)
CO2: 27 mmol/L (ref 22–32)
Calcium: 8.3 mg/dL — ABNORMAL LOW (ref 8.9–10.3)
Chloride: 105 mmol/L (ref 98–111)
Creatinine, Ser: 0.69 mg/dL (ref 0.61–1.24)
GFR, Estimated: >60 mL/min (ref >60)
Glucose, Bld: 153 mg/dL — ABNORMAL HIGH (ref 70–99)
Potassium: 3.8 mmol/L (ref 3.5–5.1)
Sodium: 139 mmol/L (ref 135–145)
Total Bilirubin: 1 mg/dL (ref 0.3–1.2)
Total Protein: 5.6 g/dL — ABNORMAL LOW (ref 6.5–8.1)

## 2021-09-11 LAB — MAGNESIUM: Magnesium: 2 mg/dL (ref 1.7–2.4)

## 2021-09-11 LAB — C-REACTIVE PROTEIN: CRP: 6.7 mg/dL — ABNORMAL HIGH (ref <1.0)

## 2021-09-11 MED ORDER — METRONIDAZOLE 500 MG PO TABS
500.0000 mg | ORAL_TABLET | Freq: Two times a day (BID) | ORAL | Status: DC
  Administered 2021-09-11 – 2021-09-13 (×4): 500 mg via ORAL
  Filled 2021-09-11 (×4): qty 1

## 2021-09-11 MED ORDER — ADULT MULTIVITAMIN W/MINERALS CH
1.0000 | ORAL_TABLET | Freq: Every day | ORAL | Status: DC
  Administered 2021-09-11 – 2021-09-13 (×3): 1 via ORAL
  Filled 2021-09-11 (×3): qty 1

## 2021-09-11 MED ORDER — JUVEN PO PACK
1.0000 | PACK | Freq: Two times a day (BID) | ORAL | Status: DC
  Administered 2021-09-11 – 2021-09-13 (×3): 1 via ORAL
  Filled 2021-09-11 (×4): qty 1

## 2021-09-11 NOTE — Progress Notes (Signed)
Initial Nutrition Assessment ? ?DOCUMENTATION CODES:  ? ?Not applicable ? ?INTERVENTION:  ? ?Liberalize diet to REGULAR ? ?Add Juven BID, each packet provides 80 calories, 8 grams of carbohydrate, 2.5  grams of protein (collagen), 7 grams of L-arginine and 7 grams of L-glutamine; supplement contains CaHMB, Vitamins C, E, B12 and Zinc to promote wound healing ? ?Add 30 ml ProSource Plus BID, each supplement provides 100 kcals and 15 grams protein.  ? ?Add MVI with Minerals  ? ?Check Vitamin A, Vitamin C and Zinc given extensive wounds; CRP already resulted for today. If deficient will add aggressive supplementation; currently receiving MVI and extra vitamins in Juven ? ?D/C Ensure for now given po intake 90-100% of meals and plan to replace with alternative interventions. If po intake inadequate on follow-up, recommend resuming Ensure.  ? ? ? ?NUTRITION DIAGNOSIS:  ? ?Increased nutrient needs related to wound healing as evidenced by estimated needs. ? ?GOAL:  ? ?Patient will meet greater than or equal to 90% of their needs ? ?MONITOR:  ? ?PO intake, Supplement acceptance, Labs, Weight trends, Skin ? ?REASON FOR ASSESSMENT:  ? ?Malnutrition Screening Tool ?  ? ?ASSESSMENT:  ? ?81 yo male admitted with acute metabolic encephalopathy, cellulitis, chronic wounds. PMH includes DM, bilateral BKA, HLD, HTN, left eye blindness secondary to glaucoma ? ?4/20 CT head showed ventriculomegaly concerning for NPH ?4/20 CT A/P with subcutaneous edema overlying sacrum/coccyx indicating possible cellulitis; small tract of L anus inferiorly to the skin of L buttocks w/o drainable fluid collection ?4/21 MRI brain with mild ventriculomegaly, neurology consulted with plans to follow-up with neurosurgery as outpt ? ?Palliative care is following; pt is Full Code. Noted pt attributes his recent decline to ill-fitting prosthetics which has led him to be less mobile. Pt has essentially been bed bound PTA. Pt has appointment next week to get  new prosthetics made ? ?Recorded po intake 90-100% of meals ? ?Pt with bilateral BKA with no wounds on either stump.  ?Various stages of pressure injuries present on admission: Vertebral stage II pressure injuries from mid thoracic to lumbar area; right shoulder stage II pressure injury; right flank stage II pressure injuries; gluteal cleft unstageable pressure injury: Present on admission ?WOC RN has evaluated  ? ?Large BM yesterday; Previously constipated with large stool burden on films ? ?No weight loss trend per weight encounters ;current wt 93.4 kg-admit wt. No weight since admission. Recommend obtaining new weight.  ? ?Vitamin Panel (4/23): ?CRP: 6.7 (H) ?Vitamin A: order pending ?Vitamin B12: 2489 (H) ?Vitamin C: order pending ?Zinc: order pending ? ?Labs: reviewed ?Meds: folic acid, senna-docusate ? ?NUTRITION - FOCUSED PHYSICAL EXAM: ? ?Unable to assess ? ?Diet Order:   ?Diet Order   ? ?       ?  Diet regular Room service appropriate? Yes; Fluid consistency: Thin  Diet effective now       ?  ? ?  ?  ? ?  ? ? ?EDUCATION NEEDS:  ? ?Not appropriate for education at this time ? ?Skin:  Skin Assessment: Skin Integrity Issues: ?Skin Integrity Issues:: Unstageable, Stage II ?Stage II: right shoulder, right flank, vertebral from mid thoracic to lumbar area ?Unstageable: gluteal cleft ? ?Last BM:  4/22 ? ?Height:  ? ?Ht Readings from Last 1 Encounters:  ?09/08/21 5\' 8"  (1.727 m)  ? ? ?Weight:  Adjusted IBW based on bilateral BKA 62 kg ? ?Wt Readings from Last 1 Encounters:  ?09/08/21 93.4 kg  ? ? ? ?BMI:  Body mass index is 31.31 kg/m?. ? ?Estimated Nutritional Needs:  ? ?Kcal:  1850-2050 kcals ? ?Protein:  90-110 g ? ?Fluid:  >/= 1.8 L ? ?Kerman Passey MS, RDN, LDN, CNSC ?Registered Dietitian III ?Clinical Nutrition ?RD Pager and On-Call Pager Number Located in [place]  ? ?

## 2021-09-11 NOTE — Progress Notes (Signed)
Pharmacy Antibiotic Note ? ?Scott Reynolds is a 81 y.o. male admitted on 09/08/2021 with weakness and confusion.  Patient is noted to have multiple bed sores in the sacral area, back, groin, inner thighs, chest, and head. PMH notable for bilateral AKA.  Pharmacy has been consulted for vancomycin dosing for cellulitis. ? ?CT A/P with subcutaneous edema overlying sacrum/coccyx may represent cellulitis; small tract of L anus inferiorly to the skin of L buttocks w/o drainable fluid collection. CT of R femur also suggesting mild cellulitis. ? ?09/11/2021 ?Day #4 abx ?Remains afebrile, WBC remains WNL, SCr WNL ?4/21 WOC note: minimal, serous drainage ? ?Plan: ?Continues on vancomycin 1000mg  IV q24 hours (eAUC 420, Scr 1, IBW, Vd 0.5) ?Continues on Ceftriaxone 1g IV q24 hours per MD ?Continues on Flagyl 500mg  IV q12 hours per MD (changed to PO)  ?Rec: change to PO abx & add stop date ? ? ?Height: 5\' 8"  (172.7 cm) ?Weight: 93.4 kg (205 lb 14.6 oz) ?IBW/kg (Calculated) : 68.4 ? ?Temp (24hrs), Avg:98.7 ?F (37.1 ?C), Min:98.4 ?F (36.9 ?C), Max:99 ?F (37.2 ?C) ? ?Recent Labs  ?Lab 09/08/21 ?1624 09/08/21 ?1625 09/08/21 ?1642 09/09/21 ?09/10/21 09/10/21 ?09/11/21 09/11/21 ?09/12/21  ?WBC  --  8.6  --  8.4 6.7  --   ?CREATININE  --  0.94 1.00 0.65 0.79 0.69  ?LATICACIDVEN 1.5  --   --   --   --   --   ? ?  ?Estimated Creatinine Clearance: 80.3 mL/min (by C-G formula based on SCr of 0.69 mg/dL).   ? ?No Known Allergies ?Antimicrobials this admission: ?Vancomycin 4/20 >>  ?Flagyl 4/20 >> ?Ceftriaxone 4/21 >>  ?Dose adjustments this admission: ?Microbiology results: ?4/20 BCx: ngtd ?4/21 UCx ngF ? ?Thank you for allowing pharmacy to be a part of this patient?s care. ? ?5/21, Pharm.D ?09/11/2021 10:28 AM ? ?

## 2021-09-11 NOTE — TOC Initial Note (Signed)
Transition of Care (TOC) - Initial/Assessment Note  ? ? ?Patient Details  ?Name: Scott Reynolds ?MRN: [id number] ?Date of Birth: 1941/04/28 ? ?Transition of Care (TOC) CM/SW Contact:    ?Tawanna Cooler, RN ?Phone Number: ?09/11/2021, 4:42 PM ? ?Clinical Narrative:                 ? ?Patient from home alone, multiple bedsores. Brothers help him, but patient has limited mobility due to bilateral BKA.  Is blind in the left eye.  Does have prosthetic legs but they are old, has an appt to get new ones.  Palliative care met with patient, patient wants to continue full treatment and go to rehab to get stronger.   ?PT eval pending.  ?TOC following.  ? ? ?Expected Discharge Plan: [place] ?Barriers to Discharge: Continued Medical Work up ? ? ?Expected Discharge Plan and Services ?Expected Discharge Plan: [place] ?  ?   ?Living arrangements for the past 2 months: Single Family Home ?                ?  ? ?Prior Living Arrangements/Services ?Living arrangements for the past 2 months: [place] ?Lives with:: Self ?Patient language and need for interpreter reviewed:: Yes ?       ?Need for Family Participation in Patient Care: Yes (Comment) ?Care giver support system in place?: Yes (comment) ?  ?Criminal Activity/Legal Involvement Pertinent to Current Situation/Hospitalization: No - Comment as needed ? ?Activities of Daily Living ?Home Assistive Devices/Equipment: Prosthesis, Grab bars in shower, Shower chair without back, Wheelchair ?ADL Screening (condition at time of admission) ?Patient's cognitive ability adequate to safely complete daily activities?: Yes ?Is the patient deaf or have difficulty hearing?: No ?Does the patient have difficulty seeing, even when wearing glasses/contacts?: No ?Does the patient have difficulty concentrating, remembering, or making decisions?: No ?Patient able to express need for assistance with ADLs?: No ?Does the patient have difficulty dressing or  bathing?: Yes ?Independently performs ADLs?: No ?Communication: Independent ?Dressing (OT): Independent with device (comment) ?Is this a change from baseline?: Pre-admission baseline ?Grooming: Dependent ?Is this a change from baseline?: Change from baseline, expected to last >3 days ?Feeding: Independent ?Bathing: Needs assistance ?Is this a change from baseline?: Change from baseline, expected to last >3 days ?Toileting: Needs assistance ?Is this a change from baseline?: Change from baseline, expected to last >3days ?In/Out Bed: Needs assistance ?Is this a change from baseline?: Change from baseline, expected to last >3 days ?Walks in Home: Needs assistance ?Is this a change from baseline?: Change from baseline, expected to last >3 days ?Does the patient have difficulty walking or climbing stairs?: Yes ?Weakness of Legs: Both ?Weakness of Arms/Hands: Both ? ? ?Emotional Assessment ?  ? Orientation: : Oriented to Self, Oriented to Place, Oriented to  Time, Oriented to Situation ?Alcohol / Substance Use: Not Applicable ?Psych Involvement: No (comment) ? ?Admission diagnosis:  Cellulitis of buttock [L03.317] ?Cellulitis [L03.90] ?Patient Active Problem List  ? Diagnosis Date Noted  ? Cellulitis 09/08/2021  ? Cerebral ventriculomegaly 09/08/2021  ? Status post below-knee amputation ([place]) 09/08/2021  ? Constipation 09/08/2021  ? Acute metabolic encephalopathy 90/24/0973  ? Abnormal LFTs 09/08/2021  ? Anemia 08/15/2021  ? Hypertension 08/15/2021  ? Glaucoma 08/15/2021  ? Healthcare maintenance 08/15/2021  ? Status post bilateral above knee amputation ([place]) 07/05/2016  ? Diabetes mellitus, stable ([place]) 09/23/2014  ? Diabetes mellitus without complication ([place]) 53/29/9242  ? Pure hypercholesterolemia 12/15/2012  ? Chronic kidney  disease, stage II (mild) 12/15/2012  ? ?PCP:  Delene Ruffini, MD ?Pharmacy:   ?[facility] [place], [place] ?[place] ?[place]  [address] ?Phone: [phone] Fax: [phone] ? ?[id number]?Phone: [phone]: [phone]? ? ? ?

## 2021-09-11 NOTE — Progress Notes (Signed)
?PROGRESS NOTE ? ? ? ?Scott Reynolds  [id number] DOB: 1940/08/17 DOA: 09/08/2021 ?PCP: Delene Ruffini, MD  ? ?Brief Narrative:  ? 81 y.o. 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  On presentation, he had a temperature of 100.9, AST of 95, ALT of 69, total bili of 1.3.  CT of the head showed ventriculomegaly concerning for normal pressure hydrocephalus with no other acute intracranial process.  CT of the right femur showed no acute fracture but findings suggestive of mild cellulitis.  CT of abdomen and pelvis showed subcutaneous edema overlying sacral and coccyx area representing cellulitis, small tract from left anus extending inferiorly into the skin of the left buttocks but no evidence of soft tissue gas or drainable fluid.  He was started on broad-spectrum antibiotics. ? ?Assessment & Plan: ?  ?Cellulitis ?Various stages of pressure injuries present on admission: Vertebral stage II pressure injuries from mid thoracic to lumbar area; right shoulder stage II pressure injury; right flank stage II pressure injuries; gluteal cleft unstageable pressure injury: Present on admission ?-Continue broad-spectrum antibiotics.  Blood cultures negative so far 3.3 ?-Follow wound care consultation recommendations. ? ?Acute metabolic encephalopathy ?-Possibly from above.  CT of the head showed ventriculomegaly concerning for NPH but no other acute intracranial process. ?-MRI brain as below. ?-Monitor mental status.  Fall precautions.  PT evaluation ?-Ammonia 28, TSH 1.558.  Vitamin B12 2489 ? ?Cerebral ventriculomegaly ?-MRI of the brain showed mild ventriculomegaly; NPH can have similar appearance.  Discussed MRI findings with neurology/Dr. Rory Percy via secure chat on 09/09/2021: He recommended that patient needs to follow-up with neurosurgery as an outpatient regarding the same.    ? ?Abnormal LFTs ?-Resolved ? ?Hypokalemia ?-Resolved ? ?Normocytic anemia ?-Questionable cause.  Hemoglobin stable.  Monitor intermittently ? ?Status post bilateral below-knee amputation ?-At baseline can ambulate with prosthetics.  PT eval pending ? ?Constipation ?-Large stool burden noted on CT of the abdomen.  Continue daily MiraLAX and Senokot. ? ?Glaucoma ?-Continue home eyedrops ? ?Hypertension ?-Blood pressure currently stable.  Continue losartan ? ?Hyperlipidemia--continue simvastatin ? ?Generalized deconditioning ?Goals of care ?-Overall prognosis is guarded to poor.  PT eval.  Palliative care consultation for goals of care discussion is pending. ? ? ? ?DVT prophylaxis: Lovenox ?Code Status: Full ?Family Communication: Brother at bedside on 09/10/2021 ?Disposition Plan: ?Status is: Inpatient ?Remains inpatient appropriate because: Of need for IV antibiotics.  PT eval pending ? ? ? ?Consultants: Palliative care consultation pending. ? ?Procedures: None ? ?Antimicrobials:  ?Anti-infectives (From admission, onward)  ? ? Start     Dose/Rate Route Frequency Ordered Stop  ? 09/09/21 1700  vancomycin (VANCOCIN) IVPB 1000 mg/200 mL premix       ? 1,000 mg ?200 mL/hr over 60 Minutes Intravenous Every 24 hours 09/08/21 2048    ? 09/09/21 0500  metroNIDAZOLE (FLAGYL) IVPB 500 mg       ? 500 mg ?100 mL/hr over 60 Minutes Intravenous Every 12 hours 09/08/21 2031    ? 09/09/21 0000  cefTRIAXone (ROCEPHIN) 1 g in sodium chloride 0.9 % 100 mL IVPB       ? 1 g ?200 mL/hr over 30 Minutes Intravenous Every 24 hours 09/08/21 2031    ? 09/08/21 2045  cefTRIAXone (ROCEPHIN) 1 g in sodium chloride 0.9 % 100 mL IVPB  Status:  Discontinued       ? 1 g ?200 mL/hr over 30 Minutes  Intravenous Every 24 hours 09/08/21 2030 09/08/21 2031  ? 09/08/21 1630  vancomycin (VANCOREADY) IVPB 1750 mg/350 mL       ? 1,750 mg ?175 mL/hr over 120 Minutes Intravenous  Once 09/08/21 1615 09/08/21 2024  ? 09/08/21 1615  ceFEPIme (MAXIPIME) 2 g in  sodium chloride 0.9 % 100 mL IVPB       ? 2 g ?200 mL/hr over 30 Minutes Intravenous  Once 09/08/21 1609 09/08/21 1702  ? 09/08/21 1615  metroNIDAZOLE (FLAGYL) IVPB 500 mg       ? 500 mg ?100 mL/hr over 60 Minutes Intravenous  Once 09/08/21 1609 09/08/21 1735  ? 09/08/21 1615  vancomycin (VANCOCIN) IVPB 1000 mg/200 mL premix  Status:  Discontinued       ? 1,000 mg ?200 mL/hr over 60 Minutes Intravenous  Once 09/08/21 1609 09/08/21 1615  ? ?  ? ? ? ?Subjective: ?Patient seen and examined at bedside.  Poor historian.  No chest pain, agitation, seizures or fever reported.   ?Objective: ?Vitals:  ? 09/10/21 0234 09/10/21 1453 09/10/21 2048 09/11/21 0511  ?BP: (!) 115/50 (!) 124/55 (!) 128/55 (!) 107/56  ?Pulse: 69 70 67 64  ?Resp: 18 17 18 18   ?Temp: 98.8 ?F (37.1 ?C) 98.8 ?F (37.1 ?C) 99 ?F (37.2 ?C) 98.4 ?F (36.9 ?C)  ?TempSrc: Oral Oral Oral Oral  ?SpO2: 99% 97% 99% 99%  ?Weight:      ?Height:      ? ? ?Intake/Output Summary (Last 24 hours) at 09/11/2021 0759 ?Last data filed at 09/11/2021 0500 ?Gross per 24 hour  ?Intake 720 ml  ?Output 1150 ml  ?Net -430 ml  ? ? ?Filed Weights  ? 09/08/21 1606  ?Weight: 93.4 kg  ? ? ?Examination: ? ?General: No acute distress.  Currently on room air.  Chronically ill and deconditioned looking. ?ENT/neck: No neck masses.  No JVD elevation ? respiratory: Bilateral decreased breath sounds at bases with scattered crackles CVS: Currently rate controlled; S1-S2 heard  ?abdominal: Soft, nontender, slightly distended; no organomegaly, bowel sounds are heard  ?extremities: Bilateral BKA present ?CNS: Awake; slow to respond.  Answers some questions.  No focal neurologic deficit.  Moving extremities  ?lymph: No palpable lymphadenopathy  ?skin: No obvious petechiae/lesions  ?psych: Currently not agitated.  Flat affect  ?musculoskeletal: No obvious other joint tenderness/swelling ? ? ? ?Data Reviewed: I have personally reviewed following labs and imaging studies ? ?CBC: ?Recent Labs  ?Lab  09/08/21 ?1625 09/08/21 ?1642 09/09/21 ?[id number] 09/10/21 ?0533  ?WBC 8.6  --  8.4 6.7  ?NEUTROABS 7.0  --   --  5.2  ?HGB 12.6* 13.3 9.9* 10.1*  ?HCT 38.5* 39.0 32.0* 31.8*  ?MCV 91.7  --  93.6 93.0  ?PLT 231  --  208 168  ? ? ?Basic Metabolic Panel: ?Recent Labs  ?Lab 09/08/21 ?1625 09/08/21 ?1642 09/09/21 ?[id number] 09/10/21 ?[id number] 09/11/21 ?[id number]  ?NA 140 139 139 140 139  ?K 3.0* 3.1* 2.6* 3.3* 3.8  ?CL 104 99 106 107 105  ?CO2 28  --  25 26 27   ?GLUCOSE 173* 169* 136* 115* 153*  ?BUN 23 22 18 15 15   ?CREATININE 0.94 1.00 0.65 0.79 0.69  ?CALCIUM 8.9  --  8.2* 8.2* 8.3*  ?MG  --   --   --  2.2 2.0  ? ? ?GFR: ?Estimated Creatinine Clearance: 80.3 mL/min (by C-G formula based on SCr of 0.69 mg/dL). ?Liver Function Tests: ?Recent Labs  ?Lab 09/08/21 ?1625 09/09/21 ?[id number] 09/10/21 ?[id number]  09/11/21 ?0618  ?AST 95* 63* 42* 30  ?ALT 69* 51* 40 33  ?ALKPHOS [id number]  ?BILITOT 1.3* 0.9 1.0 1.0  ?PROT 7.2 5.6* 5.6* 5.6*  ?ALBUMIN 3.2* 2.6* 2.5* 2.4*  ? ? ?No results for input(s): LIPASE, AMYLASE in the last 168 hours. ?Recent Labs  ?Lab 09/10/21 ?0541  ?AMMONIA 28  ? ? ?Coagulation Profile: ?Recent Labs  ?Lab 09/08/21 ?1625  ?INR 1.1  ? ? ?Cardiac Enzymes: ?No results for input(s): CKTOTAL, CKMB, CKMBINDEX, TROPONINI in the last 168 hours. ?BNP (last 3 results) ?No results for input(s): PROBNP in the last 8760 hours. ?HbA1C: ?No results for input(s): HGBA1C in the last 72 hours. ?CBG: ?No results for input(s): GLUCAP in the last 168 hours. ?Lipid Profile: ?No results for input(s): CHOL, HDL, LDLCALC, TRIG, CHOLHDL, LDLDIRECT in the last 72 hours. ?Thyroid Function Tests: ?Recent Labs  ?  09/10/21 ?0533  ?TSH 1.558  ? ? ?Anemia Panel: ?Recent Labs  ?  09/10/21 ?0533  ?[id number] 2,489*  ? ?Sepsis Labs: ?Recent Labs  ?Lab 09/08/21 ?1624  ?LATICACIDVEN 1.5  ? ? ? ?Recent Results (from the past 240 hour(s))  ?Blood Culture (routine x 2)     Status: None (Preliminary result)  ? Collection Time: 09/08/21  4:00 PM  ? Specimen: BLOOD   ?Result Value Ref Range Status  ? Specimen Description   Final  ?  BLOOD RIGHT ANTECUBITAL ?Performed at [facility], [place] [address] [place], [place] [address] ?  ? Special Requests   Final  ?  BOTTLES DRA

## 2021-09-11 NOTE — Consult Note (Signed)
? ?                                                                                ?Consultation Note ?Date: 09/11/2021  ? ?Patient Name: Scott Reynolds  ?DOB: 30-Sep-1940  MRN: [id number]  Age / Sex: 81 y.o., male  ?PCP: Delene Ruffini, MD ?Referring Physician: Aline August, MD ? ?Reason for Consultation: Disposition and Establishing goals of care ? ?HPI/Patient Profile: 81 y.o. male  with past medical history of hypertension, hyperlipidemia, diabetes, bilateral BKA, blindness secondary to glaucoma admitted on 09/08/2021 with lethargy and bedsores.  CT did not reveal any fracture but was suggestive of cellulitis.  Palliative consulted for goals of care. ? ?Clinical Assessment and Goals of Care: ?I met today with Mr. Mesa. ? ?I introduced palliative care as specialized medical care for people living with serious illness. It focuses on providing relief from the symptoms and stress of a serious illness. The goal is to improve quality of life for both the patient and the family. ? ?He tells me the most important things are spending time with friends, particularly his brothers, and working to get back home.  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  He has been disabled for several years after having bilateral amputation. ? ?We discussed events leading to hospitalization including continued decline in his functional status.  He relays that he thinks that all of this occurred because he has not gotten new prosthetics for greater than 20 years and as these have worn out he has eventually become less and less mobile and the prosthetics continue to fit even worse.  He has new sores related to being bedbound.  He feels as though he needs the opportunity to see if he can regain functional status prior to considering anything other than full aggressive care.  Reports he actually has an appointment scheduled for next week to have new prosthetics ordered. ? ?We discussed surrogate decision making  and advance care planning.  He has a son, but he is estranged from him and does not even know where he is or have any contact information for him.  He tells me that he has 2 living brothers who he would like to serve as his decision makers in the event he cannot make his own decisions. ? ?We discussed aggressiveness of care moving forward and he reports at this time that he wants to continue with full code/full scope treatment.  I asked his thoughts on limits of care in the event that he continues to worsen and he reports that "my brothers will know what to do depending upon what is going on."  I asked him to share more with me about his thoughts on this and he simply stated again, "you have to talk to my brother based upon what is going on." ? ?SUMMARY OF RECOMMENDATIONS   ?-Full code/full scope ?-He would like for his 2 brothers to serve as a surrogate decision-makers if he is unable to make his own decisions.  While he does have a son, he has been estranged from him for greater than 10 years and does not even have any contact information for his  son. ?-He feels that his recent decline has occurred because his prosthetics are no longer fitting well.  This is led him to become less mobile and eventually bedbound.  He tells me he has an appointment next week to get new prosthetics ordered as it has been greater than 24 years since he has been made.  His goal is to transition to skilled facility and get new prosthetics made for trial of rehab to see if he can regain functional status.  I tried to talk with him about plans if this does not work out, but he is set on this plan. ? ?Psycho-social/Spiritual:  ?Desire for further Chaplaincy support:no ?Additional Recommendations: Caregiving  Support/Resources ? ?Prognosis:  ?Unable to determine ? ?Discharge Planning: [place] for rehab with Palliative care service follow-up  ? ?  ? ?Primary Diagnoses: ?Present on Admission: ? Cellulitis ? Pure  hypercholesterolemia ? Hypertension ? Glaucoma ? ? ?I have reviewed the medical record, interviewed the patient and family, and examined the patient. The following aspects are pertinent. ? ?Past Medical History:  ?Diagnosis Date  ? Diabetes mellitus without complication ([place])   ? Erectile dysfunction   ? Glaucoma   ? Hyperlipidemia   ? Hypertension   ? ?Social History  ? ?Socioeconomic History  ? Marital status: Single  ?  Spouse name: Not on file  ? Number of children: Not on file  ? Years of education: Not on file  ? Highest education level: Not on file  ?Occupational History  ? Not on file  ?Tobacco Use  ? Smoking status: Never  ? Smokeless tobacco: Never  ?Substance and Sexual Activity  ? Alcohol use: Not on file  ? Drug use: Not on file  ? Sexual activity: Not on file  ?Other Topics Concern  ? Not on file  ?Social History Narrative  ? Not on file  ? ?Social Determinants of Health  ? ?Financial Resource Strain: Not on file  ?Food Insecurity: Not on file  ?Transportation Needs: Not on file  ?Physical Activity: Not on file  ?Stress: Not on file  ?Social Connections: Not on file  ? ?Family History  ?Problem Relation Age of Onset  ? Diabetes Brother   ? Cancer Neg Hx   ? ?Scheduled Meds: ? aspirin  325 mg Oral Daily  ? dorzolamide  1 drop Both Eyes BID  ? enoxaparin (LOVENOX) injection  40 mg Subcutaneous Q24H  ? feeding supplement  237 mL Oral BID BM  ? folic acid  1 mg Oral Daily  ? Gerhardt's butt cream   Topical QID  ? losartan  25 mg Oral Daily  ? polyethylene glycol  17 g Oral Daily  ? prednisoLONE acetate  1 drop Both Eyes QID  ? senna-docusate  1 tablet Oral BID  ? simvastatin  20 mg Oral QHS  ? ?Continuous Infusions: ? cefTRIAXone (ROCEPHIN)  IV 1 g (09/10/21 2301)  ? metronidazole 500 mg (09/11/21 0520)  ? vancomycin 1,000 mg (09/10/21 1745)  ? ?PRN Meds:.bisacodyl ?Medications Prior to Admission:  ?Prior to Admission medications   ?Medication Sig Start Date End Date Taking? Authorizing Provider   ?acetaZOLAMIDE (DIAMOX) 250 MG tablet Take 500 mg by mouth 2 (two) times daily.   Yes [provider]  ?aspirin 325 MG tablet Take 325 mg by mouth daily.   Yes [provider]  ?dorzolamide (TRUSOPT) 2 % ophthalmic solution Place 1 drop into both eyes 2 (two) times daily. 01/18/13  Yes [provider]  ?  losartan (COZAAR) 25 MG tablet TAKE 1 TABLET BY MOUTH TWICE A DAY ?Patient taking differently: Take 25 mg by mouth daily. 08/24/21  Yes Elayne Snare, MD  ?prednisoLONE acetate (PRED FORTE) 1 % ophthalmic suspension Place 1 drop into both eyes 4 (four) times daily. 01/05/14  Yes [provider]  ?simvastatin (ZOCOR) 20 MG tablet TAKE 1 TABLET BY MOUTH EVERYDAY AT BEDTIME ?Patient taking differently: Take 20 mg by mouth at bedtime. 06/24/21  Yes Elayne Snare, MD  ?Blood Glucose Monitoring Suppl ([place]) w/Device KIT 1 each by Does not apply route daily. Use OneTouch Verio Flex meter to check blood sugar once daily.    [provider]  ?COVID-19 mRNA Vac-TriS, Pfizer, SUSP injection Inject into the muscle. 10/27/20   Carlyle Basques, MD  ?glipiZIDE (GLUCOTROL XL) 2.5 MG 24 hr tablet Take 1 tablet (2.5 mg total) by mouth daily with breakfast. ?Patient not taking: Reported on 09/08/2021 07/02/20   Elayne Snare, MD  ?glucose blood test strip 1 each by Other route daily. Use OneTouch Verio test strips as instructed to check blood sugar once daily. 01/20/19   Elayne Snare, MD  ?metFORMIN (GLUCOPHAGE) 1000 MG tablet TAKE 1 TABLET BY MOUTH TWICE A DAY 02/24/21   Elayne Snare, MD  ? ?No Known Allergies ?Review of Systems ?Weakness, back pain ? ?Physical Exam ?General: Alert, awake, in no acute distress chronically ill-appearing ?Heart: Regular rate and rhythm. No murmur appreciated. ?Lungs: Decreased air movement, clear ?Abdomen: Soft, nontender, nondistended, positive bowel sounds.   ?Ext: No significant edema, amputation bilaterally ?Skin: Warm and dry ?Neuro: Grossly intact,  nonfocal. ? ? ?Vital Signs: BP (!) 107/56 (BP Location: Right Arm)   Pulse 64   Temp 98.4 ?F (36.9 ?C) (Oral)   Resp 18   Ht _0  (1.727 m)   Wt 93.4 kg   SpO2 99%   BMI 31.31 kg/m?  ?Pain Scale: 0-10 ?  ?Pain Score: 0-No

## 2021-09-12 DIAGNOSIS — G9389 Other specified disorders of brain: Secondary | ICD-10-CM | POA: Diagnosis not present

## 2021-09-12 DIAGNOSIS — R7989 Other specified abnormal findings of blood chemistry: Secondary | ICD-10-CM | POA: Diagnosis not present

## 2021-09-12 DIAGNOSIS — L03818 Cellulitis of other sites: Secondary | ICD-10-CM | POA: Diagnosis not present

## 2021-09-12 DIAGNOSIS — G9341 Metabolic encephalopathy: Secondary | ICD-10-CM | POA: Diagnosis not present

## 2021-09-12 NOTE — Evaluation (Signed)
Physical Therapy Evaluation ?Patient Details ?Name: Scott Reynolds ?MRN: [id number] ?DOB: 18-Jun-1940 ?Today's Date: 09/12/2021 ? ?History of Present Illness ? Pt is an 81 yo male presenting [facility] ED on 4/20  with weakness and altered mental status, CT scans suggestive of cellulitis.   PMH: DM, HLD, HTN, b/l transtibial amputee uses prosethetic legs at baseline.  ?Clinical Impression ? Pt presents with the problems listed above and functional impairments listed below. Pt reporting they have not been ambulating recently secondary to ill-fitting prosthetics and when they went to the prosthetist to try on the new ones he was too weak to perform sit to stand and attempt ambulation. Pt total assist +2 for bed mobility and transfers via lateral squat pivot transfer to recliner and back to bed, would recommend staff use hoyer lift to perform transfers moving forward. Pt has range of motion in limbs but is generally weak; encouraged pt to complete appropriate BUE exercises to continue to be able to transfer himself in hospital. Introduced possibilty of SNF-level therapies upon discharge, pt agreeable and motivated to continue with therapy. We will continue to follow him acutely to promote modified independence with mobility and safe discharge to the destination indicated below.  ?   ? ?Recommendations for follow up therapy are one component of a multi-disciplinary discharge planning process, led by the attending physician.  Recommendations may be updated based on patient status, additional functional criteria and insurance authorization. ? ?Follow Up Recommendations Skilled nursing-short term rehab (<3 hours/day) ? ?  ?Assistance Recommended at Discharge Frequent or constant Supervision/Assistance  ?Patient can return home with the following ? Two people to help with walking and/or transfers;Two people to help with bathing/dressing/bathroom;Assistance with cooking/housework;Assist for transportation;Help with stairs or ramp for  entrance ? ?  ?Equipment Recommendations None recommended by PT (TBD)  ?Recommendations for Other Services ?    ?  ?Functional Status Assessment Patient has had a recent decline in their functional status and demonstrates the ability to make significant improvements in function in a reasonable and predictable amount of time.  ? ?  ?Precautions / Restrictions Precautions ?Precautions: Fall ?Precaution Comments: Bilateral transtibial amputee  ? ?  ? ?Mobility ? Bed Mobility ?Overal bed mobility: Needs Assistance ?Bed Mobility: Supine to Sit ?  ?  ?Supine to sit: +2 for physical assistance, Total assist, HOB elevated ?  ?  ?General bed mobility comments: Pt total assist +2 for supine to sit for trunk elevation and swinging BLEs off bed, mod assist for scooting hips to EOB via bed pad. Pt with strong posterior trunk lean during static sitting, pt reporting secondary to fear to falling. Pt min assist for trunk steadying in sitting with single UE support. ?  ? ?Transfers ?Overall transfer level: Needs assistance ?Equipment used: Ambulation equipment used ?Transfers: Bed to chair/wheelchair/BSC ?  ?  ?  ?Squat pivot transfers: Total assist, +2 physical assistance ?  ?  ?General transfer comment: Pt total assist +2 squat pivot transfer for lift assist, pt too weak to provide BUE assist but did provide a small amount of lift assist through BLE hip extension. Pt required use of bed pad +2 for scooting hips back in chair. Recommend staff use Hoyer lift during acute stay. ?  ? ?Ambulation/Gait ?  ?  ?  ?  ?  ?  ?  ?General Gait Details: deferred, pt does not have his prosthetics. If pt continues acute stay, consider trailing WC as pt has been using one PTA. ? ?Stairs ?  ?  ?  ?  ?  ? ?  Wheelchair Mobility ?  ? ?Modified Rankin (Stroke Patients Only) ?  ? ?  ? ?Balance Overall balance assessment: Needs assistance ?Sitting-balance support: Single extremity supported ?Sitting balance-Leahy Scale: Poor ?Sitting balance - Comments:  Pt demonstrating posterior lean and required at least 1 UE support on bed rail during static sitting, min assist provided to bring trunk upright. Pt reporting fear of falling. ?Postural control: Posterior lean ?  ?  ?  ?  ?  ?  ?  ?  ?  ?  ?  ?  ?  ?  ?   ? ? ? ?Pertinent Vitals/Pain Pain Assessment ?Pain Assessment: No/denies pain  ? ? ?Home Living Family/patient expects to be discharged to:: Private residence ?Living Arrangements: Alone ?Available Help at Discharge: Family;Available PRN/intermittently ?  ?  ?  ?  ?  ?  ?  ?   ?  ?Prior Function Prior Level of Function : Independent/Modified Independent ?  ?  ?  ?  ?  ?  ?Mobility Comments: uses crutches and RW with prosthetics ?ADLs Comments: meals on wheels ?  ? ? ?Hand Dominance  ?   ? ?  ?Extremity/Trunk Assessment  ? Upper Extremity Assessment ?Upper Extremity Assessment: Overall WFL for tasks assessed ?  ? ?Lower Extremity Assessment ?Lower Extremity Assessment: RLE deficits/detail;LLE deficits/detail ?RLE Deficits / Details: Transfemoral amputation, skin at distal limb clean and free of wounds. Able to perform SLR. ?RLE Sensation: WNL ?LLE Deficits / Details: Transfemoral amputation, skin at distal limb clean and free of wounds. Able to perform SLR. ?LLE Sensation: WNL ?  ? ?Cervical / Trunk Assessment ?Cervical / Trunk Assessment: Kyphotic  ?Communication  ? Communication: No difficulties  ?Cognition Arousal/Alertness: Awake/alert ?Behavior During Therapy: [facility] for tasks assessed/performed ?Overall Cognitive Status: Within Functional Limits for tasks assessed ?  ?  ?  ?  ?  ?  ?  ?  ?  ?  ?  ?  ?  ?  ?  ?  ?  ?  ?  ? ?  ?General Comments   ? ?  ?Exercises    ? ?Assessment/Plan  ?  ?PT Assessment Patient needs continued PT services  ?PT Problem List Decreased strength;Decreased range of motion;Decreased activity tolerance;Decreased balance;Decreased mobility;Decreased coordination;Decreased safety awareness;Decreased skin integrity ? ?   ?  ?PT Treatment  Interventions DME instruction;Gait training;Functional mobility training;Therapeutic activities;Therapeutic exercise;Neuromuscular re-education;Patient/family education;Wheelchair mobility training;Other (comment) (Treatments may vary on equipment available to pt; wheelchair vs. prosthetic limbs)   ? ?PT Goals (Current goals can be found in the Care Plan section)  ?Acute Rehab PT Goals ?Patient Stated Goal: To get stronger to be able to walk again ?PT Goal Formulation: With patient ?Time For Goal Achievement: 09/26/21 ?Potential to Achieve Goals: Fair ? ?  ?Frequency Min 2X/week ?  ? ? ?Co-evaluation   ?  ?  ?  ?  ? ? ?  ?AM-PAC PT "6 Clicks" Mobility  ?Outcome Measure Help needed turning from your back to your side while in a flat bed without using bedrails?: A Lot ?Help needed moving from lying on your back to sitting on the side of a flat bed without using bedrails?: Total ?Help needed moving to and from a bed to a chair (including a wheelchair)?: Total ?Help needed standing up from a chair using your arms (e.g., wheelchair or bedside chair)?: Total ?Help needed to walk in hospital room?: Total ?Help needed climbing 3-5 steps with a railing? : Total ?6 Click Score: 7 ? ?  ?  End of Session Equipment Utilized During Treatment: Gait belt ?Activity Tolerance: Patient limited by fatigue ?Patient left: in chair;with call bell/phone within reach ?Nurse Communication: Mobility status;Need for lift equipment ?PT Visit Diagnosis: Muscle weakness (generalized) (M62.81);Other abnormalities of gait and mobility (R26.89) ?  ? ?Time: 1050-1107 ?PT Time Calculation (min) (ACUTE ONLY): 17 min ? ? ?Charges:   PT Evaluation ?$PT Eval Low Complexity: 1 Low ?  ?  ?   ? ? ?Coolidge Breeze, PT, DPT ?[facility] ?Office: [phone] ?Pager: [phone] ? ?Coolidge Breeze ?09/12/2021, 12:10 PM ? ?

## 2021-09-12 NOTE — Progress Notes (Signed)
?PROGRESS NOTE ? ? ? ?Scott Reynolds  [id number] DOB: [birth year] DOA: 09/08/2021 ?PCP: Delene Ruffini, MD  ? ?Brief Narrative:  ? 81 y.o. 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  On presentation, he had a temperature of 100.9, AST of 95, ALT of 69, total bili of 1.3.  CT of the head showed ventriculomegaly concerning for normal pressure hydrocephalus with no other acute intracranial process.  CT of the right femur showed no acute fracture but findings suggestive of mild cellulitis.  CT of abdomen and pelvis showed subcutaneous edema overlying sacral and coccyx area representing cellulitis, small tract from left anus extending inferiorly into the skin of the left buttocks but no evidence of soft tissue gas or drainable fluid.  He was started on broad-spectrum antibiotics. ? ?Assessment & Plan: ?  ?Cellulitis ?Various stages of pressure injuries present on admission: Vertebral stage II pressure injuries from mid thoracic to lumbar area; right shoulder stage II pressure injury; right flank stage II pressure injuries; gluteal cleft unstageable pressure injury: Present on admission ?-Continue broad-spectrum antibiotics.  Blood cultures negative so far 3.3 ?-Follow wound care consultation recommendations. ? ?Acute metabolic encephalopathy ?-Possibly from above.  CT of the head showed ventriculomegaly concerning for NPH but no other acute intracranial process. ?-MRI brain as below. ?-Monitor mental status.  Fall precautions.  PT evaluation still pending ?-Ammonia 28, TSH 1.558.  Vitamin B12 2489 ? ?Cerebral ventriculomegaly ?-MRI of the brain showed mild ventriculomegaly; NPH can have similar appearance.  Discussed MRI findings with neurology/Dr. Rory Percy via secure chat on 09/09/2021: He recommended that patient needs to follow-up with neurosurgery as an outpatient regarding the  same.   ? ?Abnormal LFTs ?-Resolved ? ?Hypokalemia ?-Resolved ? ?Normocytic anemia ?-Questionable cause.  Hemoglobin stable.  Monitor intermittently ? ?Status post bilateral below-knee amputation ?-At baseline can ambulate with prosthetics.  PT eval pending ? ?Constipation ?-Large stool burden noted on CT of the abdomen.  Continue daily MiraLAX and Senokot. ? ?Glaucoma ?-Continue home eyedrops ? ?Hypertension ?-Blood pressure currently stable.  Continue losartan ? ?Hyperlipidemia--continue simvastatin ? ?Generalized deconditioning ?Goals of care ?-Overall prognosis is guarded to poor.  PT eval.  Palliative care consultation appreciated.  Patient remains full code. ? ? ?DVT prophylaxis: Lovenox ?Code Status: Full ?Family Communication: Brother at bedside on 09/10/2021 ?Disposition Plan: ?Status is: Inpatient ?Remains inpatient appropriate because: Of need for IV antibiotics.  PT eval pending ? ? ? ?Consultants: Palliative care  ? ?Procedures: None ? ?Antimicrobials:  ?Anti-infectives (From admission, onward)  ? ? Start     Dose/Rate Route Frequency Ordered Stop  ? 09/11/21 2200  metroNIDAZOLE (FLAGYL) tablet 500 mg       ? 500 mg Oral Every 12 hours 09/11/21 1025    ? 09/09/21 1700  vancomycin (VANCOCIN) IVPB 1000 mg/200 mL premix       ? 1,000 mg ?200 mL/hr over 60 Minutes Intravenous Every 24 hours 09/08/21 2048    ? 09/09/21 0500  metroNIDAZOLE (FLAGYL) IVPB 500 mg  Status:  Discontinued       ? 500 mg ?100 mL/hr over 60 Minutes Intravenous Every 12 hours 09/08/21 2031 09/11/21 1025  ? 09/09/21 0000  cefTRIAXone (ROCEPHIN) 1 g in sodium chloride 0.9 % 100 mL IVPB       ? 1 g ?200 mL/hr over 30 Minutes Intravenous Every 24 hours 09/08/21 2031    ? 09/08/21 2045  cefTRIAXone (ROCEPHIN) 1 g in sodium chloride 0.9 % 100 mL IVPB  Status:  Discontinued       ? 1 g ?200 mL/hr over 30 Minutes Intravenous Every 24 hours 09/08/21 2030 09/08/21 2031  ? 09/08/21 1630  vancomycin (VANCOREADY) IVPB 1750 mg/350 mL       ? 1,750  mg ?175 mL/hr over 120 Minutes Intravenous  Once 09/08/21 1615 09/08/21 2024  ? 09/08/21 1615  ceFEPIme (MAXIPIME) 2 g in sodium chloride 0.9 % 100 mL IVPB       ? 2 g ?200 mL/hr over 30 Minutes Intravenous  Once 09/08/21 1609 09/08/21 1702  ? 09/08/21 1615  metroNIDAZOLE (FLAGYL) IVPB 500 mg       ? 500 mg ?100 mL/hr over 60 Minutes Intravenous  Once 09/08/21 1609 09/08/21 1735  ? 09/08/21 1615  vancomycin (VANCOCIN) IVPB 1000 mg/200 mL premix  Status:  Discontinued       ? 1,000 mg ?200 mL/hr over 60 Minutes Intravenous  Once 09/08/21 1609 09/08/21 1615  ? ?  ? ? ? ?Subjective: ?Patient seen and examined at bedside.  Poor historian.  Denies worsening shortness breath, fever, vomiting or abdominal pain.   ?Objective: ?Vitals:  ? 09/11/21 0511 09/11/21 1343 09/11/21 2100 09/12/21 0435  ?BP: (!) 107/56 (!) 114/54 (!) 114/52 (!) 111/56  ?Pulse: 64 74 74 63  ?Resp: 18 19 16 18   ?Temp: 98.4 ?F (36.9 ?C) 98.4 ?F (36.9 ?C) 98.4 ?F (36.9 ?C) 98.2 ?F (36.8 ?C)  ?TempSrc: Oral Oral Oral Oral  ?SpO2: 99% 94% 96% 99%  ?Weight:      ?Height:      ? ? ?Intake/Output Summary (Last 24 hours) at 09/12/2021 0744 ?Last data filed at 09/12/2021 0600 ?Gross per 24 hour  ?Intake 480 ml  ?Output 1550 ml  ?Net -1070 ml  ? ? ?Filed Weights  ? 09/08/21 1606  ?Weight: 93.4 kg  ? ? ?Examination: ? ?General: On room air currently.  No distress.  Chronically ill and deconditioned looking. ?ENT/neck: JVD is not elevated.  No palpable thyromegaly.  ? Respiratory: Decreased breath sounds at bases bilaterally with some scattered crackles  ?CVS: S1-S2 heard; rate controlled currently ?abdominal: Soft, nontender, distended slightly; no organomegaly, normal bowel sounds heard  ?extremities: Bilateral BKA present ?CNS: Still slow to respond.  Alert.  Answers some questions.  No focal neurologic deficit.  Moves extremities  ?lymph: No cervical lymphadenopathy  ?skin: No obvious ecchymosis/petechiae  ?psych: Affect is flat.  No signs of  agitation. ?musculoskeletal: No obvious other joint tenderness/swelling ? ? ? ?Data Reviewed: I have personally reviewed following labs and imaging studies ? ?CBC: ?Recent Labs  ?Lab 09/08/21 ?1625 09/08/21 ?1642 09/09/21 ?[id number] 09/10/21 ?0533  ?WBC 8.6  --  8.4 6.7  ?NEUTROABS 7.0  --   --  5.2  ?HGB 12.6* 13.3 9.9* 10.1*  ?HCT 38.5* 39.0 32.0* 31.8*  ?MCV 91.7  --  93.6 93.0  ?PLT 231  --  208 168  ? ? ?Basic Metabolic Panel: ?Recent Labs  ?Lab 09/08/21 ?1625 09/08/21 ?1642 09/09/21 ?[id number] 09/10/21 ?[id number] 09/11/21 ?[id number]  ?NA 140 139 139 140 139  ?K 3.0* 3.1* 2.6* 3.3* 3.8  ?CL 104 99 106 107 105  ?CO2 28  --  25 26 27   ?GLUCOSE 173* 169* 136* 115* 153*  ?BUN 23 22 18 15 15   ?CREATININE 0.94 1.00 0.65 0.79 0.69  ?CALCIUM 8.9  --  8.2* 8.2* 8.3*  ?MG  --   --   --  2.2 2.0  ? ? ?GFR: ?Estimated Creatinine Clearance: 80.3 mL/min (by C-G formula based on SCr of 0.69 mg/dL). ?Liver Function Tests: ?Recent Labs  ?Lab 09/08/21 ?1625 09/09/21 ?[id number] 09/10/21 ?[id number] 09/11/21 ?[id number]  ?AST 95* 63* 42* 30  ?ALT 69* 51* 40 33  ?ALKPHOS [id number]  ?BILITOT 1.3* 0.9 1.0 1.0  ?PROT 7.2 5.6* 5.6* 5.6*  ?ALBUMIN 3.2* 2.6* 2.5* 2.4*  ? ? ?No results for input(s): LIPASE, AMYLASE in the last 168 hours. ?Recent Labs  ?Lab 09/10/21 ?0541  ?AMMONIA 28  ? ? ?Coagulation Profile: ?Recent Labs  ?Lab 09/08/21 ?1625  ?INR 1.1  ? ? ?Cardiac Enzymes: ?No results for input(s): CKTOTAL, CKMB, CKMBINDEX, TROPONINI in the last 168 hours. ?BNP (last 3 results) ?No results for input(s): PROBNP in the last 8760 hours. ?HbA1C: ?No results for input(s): HGBA1C in the last 72 hours. ?CBG: ?No results for input(s): GLUCAP in the last 168 hours. ?Lipid Profile: ?No results for input(s): CHOL, HDL, LDLCALC, TRIG, CHOLHDL, LDLDIRECT in the last 72 hours. ?Thyroid Function Tests: ?Recent Labs  ?  09/10/21 ?0533  ?TSH 1.558  ? ? ?Anemia Panel: ?Recent Labs  ?  09/10/21 ?0533  ?[id number] 2,489*  ? ? ?Sepsis Labs: ?Recent Labs  ?Lab 09/08/21 ?1624  ?LATICACIDVEN  1.5  ? ? ? ?Recent Results (from the past 240 hour(s))  ?Blood Culture (routine x 2)     Status: None (Preliminary result)  ? Collection Time: 09/08/21  4:00 PM  ? Specimen: BLOOD  ?Result Value Ref Range Status  ? Specimen

## 2021-09-12 NOTE — Care Management Important Message (Signed)
Important Message ? ?Patient Details IM Letter placed in Patients room. ?Name: Scott Reynolds ?MRN: [id number] ?Date of Birth: July 17, 1940 ? ? ?Medicare Important Message Given:  Yes ? ? ? ? ?Caren Macadam ?09/12/2021, 9:57 AM ?

## 2021-09-12 NOTE — TOC Progression Note (Signed)
Transition of Care (TOC) - Progression Note  ? ? ?Patient Details  ?Name: Scott Reynolds ?MRN: [id number] ?Date of Birth: 02-01-1941 ? ?Transition of Care (TOC) CM/SW Contact  ?Maily Debarge, Meriam Sprague, RN ?Phone Number: ?09/12/2021, 2:00 PM ? ?Clinical Narrative:    ?Per previous TOC note pt would like to go to rehab to get stronger prior to going home. Physical therapy has recommended SNF at this time. FL2 faxed out to area SNFs. Will start insurance auth for SNF with [facility]. TOC will continue to follow. ? ? ?Expected Discharge Plan: Skilled Nursing Facility ?Barriers to Discharge: Continued Medical Work up ? ?Expected Discharge Plan and Services ?Expected Discharge Plan: Skilled Nursing Facility ?  ?  ?  ?Living arrangements for the past 2 months: Single Family Home ?   Readmission Risk Interventions ?   ? View : No data to display.  ?  ?  ?  ? ? ?

## 2021-09-12 NOTE — NC FL2 (Signed)
?[place] MEDICAID FL2 LEVEL OF CARE SCREENING TOOL  ?  ? ?IDENTIFICATION  ?Patient Name: ?Scott Reynolds Birthdate: 1941/02/17 Sex: male Admission Date (Current Location): ?09/08/2021  ?[place] and [place] Number: ? [place] ?  Facility and Address:  ?[facility],  [address] [place], [place] ?     Provider Number: ?[id number]  ?Attending Physician Name and Address:  ?Aline August, MD ? Relative Name and Phone Number:  ?  ?   ?Current Level of Care: ?Hospital Recommended Level of Care: ?[place] Prior Approval Number: ?  ? ?Date Approved/Denied: ?  PASRR Number: ?  ? ?Discharge Plan: ?SNF ?  ? ?Current Diagnoses: ?Patient Active Problem List  ? Diagnosis Date Noted  ? Cellulitis 09/08/2021  ? Cerebral ventriculomegaly 09/08/2021  ? Status post below-knee amputation ([place]) 09/08/2021  ? Constipation 09/08/2021  ? Acute metabolic encephalopathy [id number]  ? Abnormal LFTs 09/08/2021  ? Anemia 08/15/2021  ? Hypertension 08/15/2021  ? Glaucoma 08/15/2021  ? Healthcare maintenance 08/15/2021  ? Status post bilateral above knee amputation ([place]) 07/05/2016  ? Diabetes mellitus, stable ([place]) 09/23/2014  ? Diabetes mellitus without complication ([place]) [id number]  ? Pure hypercholesterolemia 12/15/2012  ? Chronic kidney disease, stage II (mild) 12/15/2012  ? ? ?Orientation RESPIRATION BLADDER Height & Weight   ?  ?Self, Time, Situation, Place ? Normal External catheter Weight: 93.4 kg ?Height:  5\' 8"  (172.7 cm)  ?BEHAVIORAL SYMPTOMS/MOOD NEUROLOGICAL BOWEL NUTRITION STATUS  ?    Incontinent Diet (Regular)  ?AMBULATORY STATUS COMMUNICATION OF NEEDS Skin   ?Extensive Assist Verbally Other (Comment) (see note) ?  ?  ?  ?    ?     ?     ? ? ?Personal Care Assistance Level of Assistance  ?Bathing, Feeding, Dressing Bathing Assistance: Limited assistance ?Feeding assistance: Limited assistance ?Dressing Assistance: Limited assistance ?   ? ?Functional Limitations Info  ?Sight, Hearing, Speech  Sight Info: Impaired (blind in left eye) ?Hearing Info: Adequate ?Speech Info: Adequate  ? ? ?SPECIAL CARE FACTORS FREQUENCY  ?PT (By licensed PT), OT (By licensed OT)   ?  ?PT Frequency: 5 x weekly ?OT Frequency: 5 x weekly ?  ?  ?  ?   ? ? ?Contractures    ? ? ?Additional Factors Info  ?Code Status, Allergies Code Status Info: Full ?Allergies Info: None Known ?  ?  ?  ?   ? ?Current Medications (09/12/2021):  This is the current hospital active medication list ?Current Facility-Administered Medications  ?Medication Dose Route Frequency Provider Last Rate Last Admin  ? aspirin tablet 325 mg  325 mg Oral Daily Tu, Ching T, DO   325 mg at 09/12/21 1027  ? bisacodyl (DULCOLAX) suppository 10 mg  10 mg Rectal Daily PRN Aline August, MD      ? cefTRIAXone (ROCEPHIN) 1 g in sodium chloride 0.9 % 100 mL IVPB  1 g Intravenous Q24H Tu, Ching T, DO 200 mL/hr at 09/11/21 2335 1 g at 09/11/21 2335  ? dorzolamide (TRUSOPT) 2 % ophthalmic solution 1 drop  1 drop Both Eyes BID Tu, Ching T, DO   1 drop at 09/12/21 1029  ? enoxaparin (LOVENOX) injection 40 mg  40 mg Subcutaneous Q24H Tu, Ching T, DO   40 mg at 09/11/21 2328  ? folic acid (FOLVITE) tablet 1 mg  1 mg Oral Daily Aline August, MD   1 mg at 09/12/21 1027  ? Gerhardt's butt cream   Topical QID Aline August, MD  Given at 09/12/21 1029  ? losartan (COZAAR) tablet 25 mg  25 mg Oral Daily Tu, Ching T, DO   25 mg at 09/12/21 1027  ? metroNIDAZOLE (FLAGYL) tablet 500 mg  500 mg Oral Q12H Eudelia Bunch, RPH   500 mg at 09/12/21 1027  ? multivitamin with minerals tablet 1 tablet  1 tablet Oral Daily Aline August, MD   1 tablet at 09/12/21 1027  ? nutrition supplement (JUVEN) (JUVEN) powder packet 1 packet  1 packet Oral BID BM Aline August, MD   1 packet at 09/12/21 1027  ? polyethylene glycol (MIRALAX / GLYCOLAX) packet 17 g  17 g Oral Daily Tu, Ching T, DO   17 g at 09/11/21 0949  ? prednisoLONE acetate (PRED FORTE) 1 % ophthalmic suspension 1 drop  1 drop Both  Eyes QID Tu, Ching T, DO   1 drop at 09/12/21 1026  ? senna-docusate (Senokot-S) tablet 1 tablet  1 tablet Oral BID Aline August, MD   1 tablet at 09/11/21 2328  ? simvastatin (ZOCOR) tablet 20 mg  20 mg Oral QHS Tu, Ching T, DO   20 mg at 09/11/21 2328  ? vancomycin (VANCOCIN) IVPB 1000 mg/200 mL premix  1,000 mg Intravenous Q24H Dimple Nanas, RPH 200 mL/hr at 09/11/21 1649 1,000 mg at 09/11/21 1649  ? ? ? ?Discharge Medications: ?Please see discharge summary for a list of discharge medications. ? ?Relevant Imaging Results: ? ?Relevant Lab Results: ? ? ?Additional Information ?SS# [id number]. Pfizer covid vaccinations x 4. Wound type:  1. Vertebral Stage 2 Pressure Injuries; from midthoracic to lumbar area; partial thickness  2. Right shoulder Stage 2 Pressure Injury; partial thickness  3. Right flank Stage 2 Pressure Injury; partial thickness   4. Unstageable Pressure Injury; gluteal cleft;  5. Abrasion; right cheek; healing  Pressure Injury POA: Yes  Measurement:  Vertebral: 12cm x 3cm x 0.1cm   Right shoulder: 4cm x 3cm x 0.1cm   Right flank: 3cm x 2cm x 0.1cm   Gluteal cleft: 5cm x 6cm x 0.2cm   Wound bed:  Vertebral/right shoulder/right flank; 100% pink, partial thickness, clean   Gluteal cleft 50% pink, some fibrin/50% thicker fibrinous/dark tissue at the right lateral aspect   Right cheek; scabbed; evidenced of larger wound due to lack of melanin   Drainage (amount, consistency, odor) minimal; serous.   NOTED: patient has mucous, large amount, clear in color all over his buttocks, unclear where this coming from.  No recent use of suppository? ED nurse with no explanation. Hospitalist at the bedside, requested him to take images; did not take pictures.      Periwound: intact  Dressing procedure/placement/frequency:  1. Add air mattress for moisture management and pressure redistribution. Requested unit secretary order  2. Silicone foam for now, really at the stage the pressure injuries are no other  topical care needed; the buttock wound may need enzymatic debridement at some point   3. RD for wound healing; maximize nutrition ? ?Michaeljames Milnes, Marjie Skiff, RN ? ? ? ? ?

## 2021-09-13 DIAGNOSIS — G9389 Other specified disorders of brain: Secondary | ICD-10-CM | POA: Diagnosis not present

## 2021-09-13 DIAGNOSIS — L03818 Cellulitis of other sites: Secondary | ICD-10-CM | POA: Diagnosis not present

## 2021-09-13 DIAGNOSIS — R7989 Other specified abnormal findings of blood chemistry: Secondary | ICD-10-CM | POA: Diagnosis not present

## 2021-09-13 DIAGNOSIS — G9341 Metabolic encephalopathy: Secondary | ICD-10-CM | POA: Diagnosis not present

## 2021-09-13 LAB — CULTURE, BLOOD (ROUTINE X 2)
Culture: NO GROWTH
Culture: NO GROWTH
Special Requests: ADEQUATE
Special Requests: ADEQUATE

## 2021-09-13 LAB — ZINC: Zinc: 52 ug/dL (ref 44–115)

## 2021-09-13 LAB — VITAMIN A: Vitamin A (Retinoic Acid): 10.4 ug/dL — ABNORMAL LOW (ref 22.0–69.5)

## 2021-09-13 MED ORDER — AMOXICILLIN-POT CLAVULANATE 875-125 MG PO TABS: 1.0000 | ORAL_TABLET | Freq: Two times a day (BID) | ORAL | 0 refills | Status: AC

## 2021-09-13 MED ORDER — DOXYCYCLINE HYCLATE 100 MG PO CAPS: 100.0000 mg | ORAL_CAPSULE | Freq: Two times a day (BID) | ORAL | 0 refills | Status: AC

## 2021-09-13 MED ORDER — LOSARTAN POTASSIUM 25 MG PO TABS: 25.0000 mg | ORAL_TABLET | Freq: Every day | ORAL | Status: AC

## 2021-09-13 MED ORDER — POLYETHYLENE GLYCOL 3350 17 G PO PACK: 17.0000 g | PACK | Freq: Every day | ORAL | 0 refills | Status: AC | PRN

## 2021-09-13 MED ORDER — SENNOSIDES-DOCUSATE SODIUM 8.6-50 MG PO TABS: 1.0000 | ORAL_TABLET | Freq: Two times a day (BID) | ORAL | 0 refills | Status: AC

## 2021-09-13 MED ORDER — GERHARDT'S BUTT CREAM
1.0000 | TOPICAL_CREAM | Freq: Four times a day (QID) | CUTANEOUS | 0 refills | Status: AC
Start: 2021-09-13 — End: ?

## 2021-09-13 MED ORDER — FOLIC ACID 1 MG PO TABS
1.0000 mg | ORAL_TABLET | Freq: Every day | ORAL | 0 refills | Status: AC
Start: 2021-09-13 — End: ?

## 2021-09-13 NOTE — Progress Notes (Signed)
Attempted to give report to Nurse at [facility], but the nurse didn't answer. Will try again. ?

## 2021-09-13 NOTE — Discharge Summary (Signed)
Physician Discharge Summary  ?Scott Reynolds OZD:[id number] DOB: [birth year] DOA: 09/08/2021 ? ?PCP: Delene Ruffini, MD ? ?Admit date: 09/08/2021 ?Discharge date: 09/13/2021 ? ?Admitted From: Home ?Disposition: SNF ? ?Recommendations for Outpatient Follow-up:  ?Follow up with SNF provider at earliest convenience ?Outpatient follow-up with wound care clinic ?Follow up in ED if symptoms worsen or new appear ? ? ?Home Health: No ?Equipment/Devices: None ? ?Discharge Condition: Stable ?CODE STATUS: Full ?Diet recommendation: Heart healthy/carb modified ? ?Brief/Interim Summary: ? 81 y.o. 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  On presentation, he had a temperature of 100.9, AST of 95, ALT of 69, total bili of 1.3.  CT of the head showed ventriculomegaly concerning for normal pressure hydrocephalus with no other acute intracranial process.  CT of the right femur showed no acute fracture but findings suggestive of mild cellulitis.  CT of abdomen and pelvis showed subcutaneous edema overlying sacral and coccyx area representing cellulitis, small tract from left anus extending inferiorly into the skin of the left buttocks but no evidence of soft tissue gas or drainable fluid.  He was started on broad-spectrum antibiotics.  During the hospitalization, his condition has improved.  Cultures have remained negative so far.  He has been getting wound care as per wound care consult recommendations.  Mental status has improved and currently back to baseline.  PT recommended SNF placement.  He will be discharged to SNF today once bed is available on oral Augmentin and doxycycline. ? ?Discharge Diagnoses:  ? ?Cellulitis ?Various stages of pressure injuries present on admission: Vertebral stage II pressure injuries from mid thoracic to lumbar area; right shoulder stage II pressure injury; right  flank stage II pressure injuries; gluteal cleft unstageable pressure injury: Present on admission ?-Currently on-spectrum antibiotics.  Blood cultures negative so far.  We will switch to Augmentin and Doxy today for 10 more days. ?-Follow wound care consultation recommendations.  Outpatient follow-up with wound care. ?  ?Acute metabolic encephalopathy ?-Possibly from above.  CT of the head showed ventriculomegaly concerning for NPH but no other acute intracranial process. ?-MRI brain as below. ?-Mental status has returned back to baseline.  PT recommends SNF placement.-Ammonia 28, TSH 1.558.  Vitamin B12 2489 ?  ?Cerebral ventriculomegaly ?-MRI of the brain showed mild ventriculomegaly; NPH can have similar appearance.  Discussed MRI findings with neurology/Dr. Rory Percy via secure chat on 09/09/2021: He recommended that patient needs to follow-up with neurosurgery as an outpatient regarding the same.   ?  ?Abnormal LFTs ?-Resolved ? ?Hypokalemia ?-Resolved ? ?Normocytic anemia ?-Questionable cause.  Hemoglobin stable.  Monitor intermittently ? ?Status post bilateral below-knee amputation ?-At baseline can ambulate with prosthetics.  Patient is in the process of getting new prosthesis.  Outpatient follow-up regarding the same. ? ?Diabetes mellitus type 2 ?-Blood sugars stable.  Continue metformin.  Carb modified diet.  Outpatient follow-up. ?  ?Constipation ?-Large stool burden noted on CT of the abdomen on presentation.  Continue laxatives. ? ?Glaucoma ?-Continue home eyedrops ?  ?Hypertension ?-Blood pressure currently stable.  Continue losartan ? ?Hyperlipidemia--continue simvastatin ?  ?Generalized deconditioning ?Goals of care ?-Overall prognosis is guarded to poor.   Palliative care consultation appreciated.  Patient remains full code. ?-Patient will need SNF placement.   ? ? ?Discharge Instructions ? ?Discharge Instructions   ? ? Ambulatory referral to Wound Clinic   Complete by: As directed ?  ? Diet - low  sodium  heart healthy   Complete by: As directed ?  ? Diet Carb Modified   Complete by: As directed ?  ? Discharge wound care:   Complete by: As directed ?  ? As per wound care consult recommendations:  ?  ?Wound care  Every 3 days     ?Comments: Silicone foam to the vertebral, shoulder, flank, and sacral wounds, change every 3 days and PRN soilage.  ? Increase activity slowly   Complete by: As directed ?  ? ?  ? ?Allergies as of 09/13/2021   ?No Known Allergies ?  ? ?  ?Medication List  ?  ? ?STOP taking these medications   ? ?glipiZIDE 2.5 MG 24 hr tablet ?Commonly known as: GLUCOTROL XL ?  ?Pfizer-BioNT COVID-19 Vac-TriS Susp injection ?Generic drug: COVID-19 mRNA Vac-TriS [employer]) ?  ? ?  ? ?TAKE these medications   ? ?acetaZOLAMIDE 250 MG tablet ?Commonly known as: DIAMOX ?Take 500 mg by mouth 2 (two) times daily. ?  ?amoxicillin-clavulanate 875-125 MG tablet ?Commonly known as: Augmentin ?Take 1 tablet by mouth 2 (two) times daily for 10 days. ?  ?aspirin 325 MG tablet ?Take 325 mg by mouth daily. ?  ?dorzolamide 2 % ophthalmic solution ?Commonly known as: TRUSOPT ?Place 1 drop into both eyes 2 (two) times daily. ?  ?doxycycline 100 MG capsule ?Commonly known as: VIBRAMYCIN ?Take 1 capsule (100 mg total) by mouth 2 (two) times daily for 10 days. ?  ?folic acid 1 MG tablet ?Commonly known as: FOLVITE ?Take 1 tablet (1 mg total) by mouth daily. ?  ?Gerhardt's butt cream Crea ?Apply 1 application. topically 4 (four) times daily. ?  ?glucose blood test strip ?1 each by Other route daily. Use OneTouch Verio test strips as instructed to check blood sugar once daily. ?  ?losartan 25 MG tablet ?Commonly known as: COZAAR ?Take 1 tablet (25 mg total) by mouth daily. ?  ?metFORMIN 1000 MG tablet ?Commonly known as: GLUCOPHAGE ?TAKE 1 TABLET BY MOUTH TWICE A DAY ?  ?OneTouch Verio Flex System w/Device Kit ?1 each by Does not apply route daily. Use OneTouch Verio Flex meter to check blood sugar once daily. ?  ?polyethylene glycol  17 g packet ?Commonly known as: MIRALAX / GLYCOLAX ?Take 17 g by mouth daily as needed. ?  ?prednisoLONE acetate 1 % ophthalmic suspension ?Commonly known as: PRED FORTE ?Place 1 drop into both eyes 4 (four) times daily. ?  ?senna-docusate 8.6-50 MG tablet ?Commonly known as: Senokot-S ?Take 1 tablet by mouth 2 (two) times daily. ?  ?simvastatin 20 MG tablet ?Commonly known as: ZOCOR ?TAKE 1 TABLET BY MOUTH EVERYDAY AT BEDTIME ?What changed: See the new instructions. ?  ? ?  ? ?  ?  ? ? ?  ?Discharge Care Instructions  ?(From admission, onward)  ?  ? ? ?  ? ?  Start     Ordered  ? 09/13/21 0000  Discharge wound care:       ?Comments: As per wound care consult recommendations:  ?  ?Wound care  Every 3 days     ?Comments: Silicone foam to the vertebral, shoulder, flank, and sacral wounds, change every 3 days and PRN soilage.  ? 09/13/21 0940  ? ?  ?  ? ?  ? ? ?No Known Allergies ? ?Consultations: ?Palliative care ? ? ?Procedures/Studies: ?CT Head Wo Contrast ? ?Result Date: 09/08/2021 ?CLINICAL DATA:  Mental status change, unknown cause EXAM: CT HEAD WITHOUT CONTRAST TECHNIQUE: Contiguous axial  images were obtained from the base of the skull through the vertex without intravenous contrast. RADIATION DOSE REDUCTION: This exam was performed according to the departmental dose-optimization program which includes automated exposure control, adjustment of the mA and/or kV according to patient size and/or use of iterative reconstruction technique. COMPARISON:  None. FINDINGS: Brain: No evidence of acute infarction, hemorrhage, cerebral edema, mass, mass effect, or midline shift. No extra-axial fluid collection. Ventriculomegaly, with some sulcal crowding around the vertex. Vascular: No hyperdense vessel. Skull: Normal. Negative for fracture or focal lesion. Sinuses/Orbits: No acute finding. Status post bilateral lens replacements. Other: The mastoid air cells are well aerated. IMPRESSION: IMPRESSION 1. Ventriculomegaly, with  some sulcal crowding around the vertex, as can be seen in the setting of normal pressure hydrocephalus, although diffuse cerebral volume loss can also appear similar. Correlate with symptoms and consider

## 2021-09-13 NOTE — TOC Transition Note (Signed)
Transition of Care (TOC) - CM/SW Discharge Note ? ? ?Patient Details  ?Name: Scott Reynolds ?MRN: [id number] ?Date of Birth: 11/10/1940 ? ?Transition of Care (TOC) CM/SW Contact:  ?Emmarose Klinke, Meriam Sprague, RN ?Phone Number: ?09/13/2021, 12:42 PM ? ? ?Clinical Narrative:    ? ?Pt provided with SNF bed offers. [facility]. [facility] liaison has confirmed bed availability for today. ? ?Pasrr received:  [id number] ? ?[employer] alerted this CM of approval for SNF [id number], and approval for [facility] [id number].  ? ?[facility] contacted for transport to SNF. RN to call report to [phone]. Brother contacted to alert of transport. ? ?  ? ? ? ? ? ? ? ?

## 2021-09-13 NOTE — Progress Notes (Signed)
Report given to [facility] LPN  ?

## 2021-09-14 ENCOUNTER — Non-Acute Institutional Stay (SKILLED_NURSING_FACILITY): Payer: HMO | Admitting: Adult Health

## 2021-09-14 ENCOUNTER — Encounter: Payer: Self-pay | Admitting: Adult Health

## 2021-09-14 DIAGNOSIS — L03818 Cellulitis of other sites: Secondary | ICD-10-CM | POA: Diagnosis not present

## 2021-09-14 DIAGNOSIS — E1122 Type 2 diabetes mellitus with diabetic chronic kidney disease: Secondary | ICD-10-CM

## 2021-09-14 DIAGNOSIS — Z89511 Acquired absence of right leg below knee: Secondary | ICD-10-CM

## 2021-09-14 DIAGNOSIS — G9389 Other specified disorders of brain: Secondary | ICD-10-CM | POA: Diagnosis not present

## 2021-09-14 DIAGNOSIS — G9341 Metabolic encephalopathy: Secondary | ICD-10-CM

## 2021-09-14 DIAGNOSIS — I952 Hypotension due to drugs: Secondary | ICD-10-CM

## 2021-09-14 DIAGNOSIS — N182 Chronic kidney disease, stage 2 (mild): Secondary | ICD-10-CM

## 2021-09-14 DIAGNOSIS — H409 Unspecified glaucoma: Secondary | ICD-10-CM | POA: Diagnosis not present

## 2021-09-14 DIAGNOSIS — Z89512 Acquired absence of left leg below knee: Secondary | ICD-10-CM

## 2021-09-14 LAB — VITAMIN C: Vitamin C: 0.1 mg/dL — ABNORMAL LOW (ref 0.4–2.0)

## 2021-09-14 NOTE — Progress Notes (Signed)
? ?Location:  [facility] ?Nursing Home Room Number: 517 G ?Place of Service:  SNF (31) ?Provider:  Durenda Age, DNP, FNP-BC ? ?Patient Care Team: ?Delene Ruffini, MD as PCP - General (Internal Medicine) ? ?Extended Emergency Contact Information ?Primary Emergency Contact: Bartholomew,Eddie ?Mobile Phone: [phone] ?Relation: Brother ?Secondary Emergency Contact: Holstad,OW ?Mobile Phone: [phone] ?Relation: Other ? ?Code Status:  FULL CODE ? ?Goals of care: Advanced Directive information ? ?  09/14/2021  ?  9:43 AM  ?Advanced Directives  ?Does Patient Have a Medical Advance Directive? Yes  ?Does patient want to make changes to medical advance directive? No - Patient declined  ? ? ? ?Chief Complaint  ?Patient presents with  ? Hospitalization Follow-up  ?  Hospital follow up  ? ? ?HPI:  ?Pt is a 81 y.o. male who was admitted to [facility] on 09/13/21 post hospital admission 09/08/21 to 09/13/21. 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  He presented to the hospital with increased lethargy, weakness and bedsores.  Labs showed AST 95, ALT 69, total bilirubin 1.3 and temperature of 100.9 ?F.  CT of the head showed ventriculomegaly concerning for normal pressure hydrocephalus with no other acute intracranial process.  CT of the right femur showed no acute fracture but findings suggestive of mild cellulitis.  CT of abdomen and pelvis showed subcutaneous edema overlying sacral and coccyx area representing cellulitis, a small tract from left anus extending inferiorly into the skin of the left buttocks but no evidence of soft tissue gas or drainable fluid.  He was a started on broad spectrum antibiotics.  Cultures remain negative.  Wound care done accordingly per wound consultant recommendation.  Mental status improved to baseline.  He was discharged to [facility] on Augmentin and  doxycycline. ? ?He was seen in the room today with brother at bedside. ? ?Past Medical History:  ?Diagnosis Date  ? Diabetes mellitus without complication ([place])   ? Erectile dysfunction   ? Glaucoma   ? Hyperlipidemia   ? Hypertension   ? ?Past Surgical History:  ?Procedure Laterality Date  ? LEG AMPUTATION ABOVE KNEE Bilateral   ? 2001 left leg and 2003 right leg  ? ? ?No Known Allergies ? ?Outpatient Encounter Medications as of 09/14/2021  ?Medication Sig  ? acetaZOLAMIDE (DIAMOX) 250 MG tablet Take 500 mg by mouth 2 (two) times daily.  ? amoxicillin-clavulanate (AUGMENTIN) 875-125 MG tablet Take 1 tablet by mouth 2 (two) times daily for 10 days.  ? aspirin 325 MG tablet Take 325 mg by mouth daily.  ? collagenase (SANTYL) 250 UNIT/GM ointment Apply 1 application. topically daily. Cleanse sacrum with n/s Apply santyl. hydrofera blue and secondary dressing daily  ? dorzolamide (TRUSOPT) 2 % ophthalmic solution Place 1 drop into both eyes 2 (two) times daily.  ? doxycycline (VIBRAMYCIN) 100 MG capsule Take 1 capsule (100 mg total) by mouth 2 (two) times daily for 10 days.  ? folic acid (FOLVITE) 1 MG tablet Take 1 tablet (1 mg total) by mouth daily.  ? losartan (COZAAR) 25 MG tablet Take 1 tablet (25 mg total) by mouth daily.  ? metFORMIN (GLUCOPHAGE) 1000 MG tablet TAKE 1 TABLET BY MOUTH TWICE A DAY  ? NON FORMULARY Med Pass 120 ml by mouth twice a day for supplement and wound healing  ? Nystatin (GERHARDT'S BUTT CREAM) CREA Apply 1 application. topically 4 (four) times daily.  ? polyethylene glycol (MIRALAX /  GLYCOLAX) 17 g packet Take 17 g by mouth daily as needed.  ? prednisoLONE acetate (PRED FORTE) 1 % ophthalmic suspension Place 1 drop into both eyes 4 (four) times daily.  ? senna-docusate (SENOKOT-S) 8.6-50 MG tablet Take 1 tablet by mouth 2 (two) times daily.  ? simvastatin (ZOCOR) 20 MG tablet TAKE 1 TABLET BY MOUTH EVERYDAY AT BEDTIME  ? [DISCONTINUED] Blood Glucose Monitoring Suppl ([place]) w/Device KIT 1 each by Does not apply route daily. Use OneTouch Verio Flex meter to check blood sugar once daily.  ? [DISCONTINUED] glucose blood test strip 1 each by Other route daily. Use OneTouch Verio test strips as instructed to check blood sugar once daily.  ? ?No facility-administered encounter medications on file as of 09/14/2021.  ? ? ?Review of Systems  ?Constitutional:  Negative for activity change, appetite change and fever.  ?HENT:  Negative for sore throat.   ?Eyes: Negative.   ?Cardiovascular:  Negative for chest pain and leg swelling.  ?Gastrointestinal:  Negative for abdominal distention, diarrhea and vomiting.  ?Genitourinary:  Negative for dysuria, frequency and urgency.  ?Skin:  Negative for color change.  ?Neurological:  Negative for dizziness and headaches.  ?Psychiatric/Behavioral:  Negative for behavioral problems and sleep disturbance. The patient is not nervous/anxious.    ? ? ? ?Immunization History  ?Administered Date(s) Administered  ? Fluad Quad(high Dose 65+) 01/20/2019, 02/16/2020, 05/06/2021  ? Influenza, High Dose Seasonal PF 04/22/2013, 07/06/2016, 04/22/2018  ? Influenza,inj,Quad PF,6+ Mos 03/04/2014, 01/23/2017  ? PFIZER Comirnaty(Gray Top)Covid-19 Tri-Sucrose Vaccine 10/27/2020  ? PFIZER(Purple Top)SARS-COV-2 Vaccination 07/28/2019, 08/27/2019, 04/05/2020  ? PNEUMOCOCCAL CONJUGATE-20 08/15/2021  ? Pneumococcal Conjugate-13 09/23/2014  ? Pneumococcal Polysaccharide-23 07/28/2004  ? ?Pertinent  Health Maintenance Due  ?Topic Date Due  ? OPHTHALMOLOGY EXAM  05/10/2019  ? INFLUENZA VACCINE  12/20/2021  ? HEMOGLOBIN A1C  02/15/2022  ? FOOT EXAM  Discontinued  ? ? ?  09/11/2021  ?  7:30 AM 09/11/2021  ?  8:00 PM 09/12/2021  ?  8:00 AM 09/12/2021  ?  9:00 PM 09/13/2021  ? 12:00 PM  ?Fall Risk  ?Patient Fall Risk Level _0   ? ? ? ?Vitals:  ? 09/14/21 0939  ?BP: 101/78  ?Pulse: 69  ?Temp: 97.7 ?F (36.5 ?C)  ?Weight:  193 lb 6.4 oz (87.7 kg)  ?Height: _1  (1.727 m)  ? ?Body mass index is 29.41 kg/m?. ? ?Physical Exam ?Constitutional:   ?   General: He is not in acute distress. ?   Appearance: He is obese.  ?HENT:  ?   Head: Normocephalic and atraumatic.  ?   Mouth/Throat:  ?   Mouth: Mucous membranes are moist.  ?Eyes:  ?   Comments: Left eye is blind  ?Cardiovascular:  ?   Rate and Rhythm: Normal rate and regular rhythm.  ?   Pulses: Normal pulses.  ?   Heart sounds: Normal heart sounds.  ?Pulmonary:  ?   Effort: Pulmonary effort is normal.  ?   Breath sounds: Normal breath sounds.  ?Abdominal:  ?   General: Bowel sounds are normal.  ?   Palpations: Abdomen is soft.  ?Musculoskeletal:     ?   General: No swelling.  ?   Cervical back: Normal range of motion.  ?   Comments: Bilateral BKA.  ?Skin: ?   General: Skin is warm and dry.  ?Neurological:  ?   Mental  Status: He is alert and oriented to person, place, and time. Mental status is at baseline.  ?Psychiatric:     ?   Mood and Affect: Mood normal.     ?   Behavior: Behavior normal.     ?   Thought Content: Thought content normal.     ?   Judgment: Judgment normal.  ?  ? ? ? ?Labs reviewed: ?Recent Labs  ?  09/09/21 ?2683 09/10/21 ?4196 09/11/21 ?2229  ?NA 139 140 139  ?K 2.6* 3.3* 3.8  ?CL 106 107 105  ?CO2 _0 ?GLUCOSE 136* 115* 153*  ?BUN _1 ?CREATININE 0.65 0.79 0.69  ?CALCIUM 8.2* 8.2* 8.3*  ?MG  --  2.2 2.0  ? ?Recent Labs  ?  09/09/21 ?7989 09/10/21 ?2119 09/11/21 ?4174  ?AST 63* 42* 30  ?ALT 51* 40 33  ?ALKPHOS 49 52 52  ?BILITOT 0.9 1.0 1.0  ?PROT 5.6* 5.6* 5.6*  ?ALBUMIN 2.6* 2.5* 2.4*  ? ?Recent Labs  ?  07/14/21 ?1103 09/08/21 ?1625 09/08/21 ?1642 09/09/21 ?0814 09/10/21 ?0533  ?WBC 7.4 8.6  --  8.4 6.7  ?NEUTROABS 5.9 7.0  --   --  5.2  ?HGB 11.8* 12.6* 13.3 9.9* 10.1*  ?HCT 37.4* 38.5* 39.0 32.0* 31.8*  ?MCV 95.2 91.7  --  93.6 93.0  ?PLT 214 231  --  208 168  ? ?Lab Results  ?Component Value Date  ? TSH 1.558 09/10/2021  ? ?Lab Results  ?Component  Value Date  ? HGBA1C 5.5 08/15/2021  ? ?Lab Results  ?Component Value Date  ? CHOL 113 05/06/2021  ? HDL 52.00 05/06/2021  ? [place] 52 05/06/2021  ? TRIG 47.0 05/06/2021  ? CHOLHDL 2 05/06/2021  ? ? ?Significant Diag

## 2021-09-15 ENCOUNTER — Non-Acute Institutional Stay (SKILLED_NURSING_FACILITY): Payer: HMO | Admitting: Internal Medicine

## 2021-09-15 ENCOUNTER — Encounter: Payer: Self-pay | Admitting: Internal Medicine

## 2021-09-15 DIAGNOSIS — G9341 Metabolic encephalopathy: Secondary | ICD-10-CM | POA: Diagnosis not present

## 2021-09-15 DIAGNOSIS — E1351 Other specified diabetes mellitus with diabetic peripheral angiopathy without gangrene: Secondary | ICD-10-CM

## 2021-09-15 DIAGNOSIS — G9389 Other specified disorders of brain: Secondary | ICD-10-CM | POA: Diagnosis not present

## 2021-09-15 DIAGNOSIS — L03818 Cellulitis of other sites: Secondary | ICD-10-CM

## 2021-09-15 NOTE — Patient Instructions (Signed)
See assessment and plan under each diagnosis in the problem list and acutely for this visit 

## 2021-09-15 NOTE — Assessment & Plan Note (Signed)
Current A1c prediabetic with a value of 5.5%.  While hospitalized 4/20 - 09/13/2021 glucoses ranged from a low of 115 up to high of 173.  He does not monitor glucoses at home. ?

## 2021-09-15 NOTE — Assessment & Plan Note (Signed)
On exam he is alert and oriented; but he has no concept of why he was hospitalized and what was found in reference to the cellulitis and the ventriculomegaly. ?

## 2021-09-15 NOTE — Assessment & Plan Note (Signed)
Alert & oriented now, but he was not aware of the cellulitis & possible NPH diagnoses. ?

## 2021-09-15 NOTE — Assessment & Plan Note (Signed)
Wound care nurse at SNF to monitor and coordinate with wound care clinic. ?

## 2021-09-15 NOTE — Progress Notes (Signed)
? ?NURSING HOME LOCATION:  [facility] ?ROOM NUMBER:  [id number] ? ?CODE STATUS:  Full ? ?PCP:  Delene Ruffini MD ? ?This is a comprehensive admission note to this SNFperformed on this date less than 30 days from date of admission. ?Included are preadmission medical/surgical history; reconciled medication list; family history; social history and comprehensive review of systems.  ?Corrections and additions to the records were documented. Comprehensive physical exam was also performed. Additionally a clinical summary was entered for each active diagnosis pertinent to this admission in the Problem List to enhance continuity of care. ? ?HPI: He was hospitalized 4/20 - 09/13/2021 presenting with temp up to 100.9.  Transaminitis was present with an AST of 95 and ALT of 69 and total bilirubin of 1.3 with associated acute encephalopathy.  TSH was therapeutic and ammonia and B12 levels were normal.  CT and MRI suggested ventriculomegaly with possible normal pressure hydrocephalus.  CT of the right femur suggested cellulitis.  CT of the abdomen and pelvis also suggested cellulitis in the sacral and coccygeal areas.  A small tract was noted from the left anus inferiorly into the skin of the left buttocks. ?Broad spectrum antibiotics were initiated; cultures subsequently were negative.  Wound care monitored the wounds present @ admission :vertebral stage II wound from the mid thoracic area to the lumbar area; stage II at the right shoulder; stage II at the right flank; and unstageable wound in the gluteal cleft . ?Encephalopathy resolved and cellulitis clinically improved allowing transition to Augmentin and doxycycline for an additional 10 days. ?At admission H/H was 12.6/38.5.  Prior to discharge values were 10.1/31.8.  AST and ALT normalized.  Albumin was 2.4 total protein 5.6 indicating protein/caloric malnutrition.  Glucoses while hospitalized ranged from a low of 115 up to 173.  A1c was prediabetic at  5.5%. ?He was discharged to the SNF for rehab.  Wound care follow-up as outpatient was to be pursued along with neurosurgical consultation for the possible normal pressure hydrocephalus.  Diamox was initiated. ? ?Past medical and surgical history: Includes history of glaucoma, dyslipidemia, essential hypertension, and diabetes with vascular complications. ?Below the knee amputations of both lower extremities. ? ?Social history: Nondrinker; never smoked. ? ?Family history: Reviewed ?  ?Review of systems: He states that he was hospitalized because "I fell out of my chair while asleep? my prosthetic legs were wore out.".  When asked what had been diagnosed ,his response was "they found nothing".  He was unaware of the diagnosis of cellulitis and the possible normal pressure hydrocephalus.  He states he does not check his sugars at home.  He has no associated diabetic symptoms.  He also denies any pain. ?He did mention that he has a follow-up Ophthalmologic visit with Dr. Zigmund Daniel for retinopathy as well as Dr. Dwyane Dee for diabetic follow-up. ? ?Constitutional: No fever, significant weight change, fatigue  ?Eyes: No redness, discharge, pain ?ENT/mouth: No nasal congestion, purulent discharge, earache, change in hearing, sore throat  ?Cardiovascular: No chest pain, palpitations, paroxysmal nocturnal dyspnea ?Respiratory: No cough, sputum production, hemoptysis, DOE, significant snoring, apnea  ?Gastrointestinal: No heartburn, dysphagia, abdominal pain, nausea /vomiting, rectal bleeding, melena, change in bowels ?Genitourinary: No dysuria, hematuria, pyuria, incontinence, nocturia ?Musculoskeletal: No joint stiffness, joint swelling,  pain ?Dermatologic: No rash, pruritus ?Neurologic: No dizziness, headache, syncope, seizures, numbness, tingling ?Psychiatric: No significant anxiety, depression, insomnia, anorexia ?Endocrine: No change in hair/skin/nails, excessive thirst, excessive hunger, excessive urination   ?Hematologic/lymphatic: No significant bruising, lymphadenopathy, abnormal bleeding ?Allergy/immunology: No  itchy/watery eyes, significant sneezing, urticaria, angioedema ? ?Physical exam:  ?Pertinent or positive findings: Pattern alopecia is present.  He is blind on the left and has minimal vision in the right eye.  Higinio Plan and [employer] are present.  First heart sound is accentuated.  Abdomen is protuberant.  BKA amputations present bilaterally.  He has interosseous wasting of the hands.  There is scarring over the right forearm with some vitiliginous changes. ? ?General appearance: no acute distress, increased work of breathing is present.   ?Lymphatic: No lymphadenopathy about the head, neck, axilla. ?Ears:  External ear exam shows no significant lesions or deformities.   ?Nose:  External nasal examination shows no deformity or inflammation. Nasal mucosa are pink and moist without lesions, exudates ?Oral exam: There is no oropharyngeal erythema or exudate. ?Neck:  No thyromegaly, masses, tenderness noted.    ?Heart:  Normal rate and regular rhythm. S2 normal without gallop, murmur, click, rub.  ?Lungs: Chest clear to auscultation without wheezes, rhonchi, rales, rubs. ?Abdomen: Bowel sounds are normal.  Abdomen is soft and nontender with no organomegaly, hernias, masses. ?GU: Deferred  ?Extremities:  No cyanosis, clubbing. ?Neurologic exam:  Balance, Rhomberg, finger to nose testing could not be completed due to clinical state ?Skin: Warm & dry w/o tenting. ? ?See clinical summary under each active problem in the Problem List with associated updated therapeutic plan ? ?

## 2021-09-22 ENCOUNTER — Other Ambulatory Visit: Payer: Self-pay | Admitting: Endocrinology

## 2021-10-08 ENCOUNTER — Other Ambulatory Visit: Payer: Self-pay | Admitting: Endocrinology

## 2021-10-08 DIAGNOSIS — E119 Type 2 diabetes mellitus without complications: Secondary | ICD-10-CM

## 2021-10-19 ENCOUNTER — Encounter: Payer: Self-pay | Admitting: Internal Medicine

## 2021-10-19 DIAGNOSIS — E46 Unspecified protein-calorie malnutrition: Secondary | ICD-10-CM | POA: Insufficient documentation

## 2021-11-04 ENCOUNTER — Ambulatory Visit: Payer: Medicare HMO | Admitting: Endocrinology

## 2021-11-08 ENCOUNTER — Encounter: Payer: HMO | Admitting: Internal Medicine

## 2021-11-29 ENCOUNTER — Ambulatory Visit: Payer: Medicare HMO | Admitting: Endocrinology

## 2022-03-06 ENCOUNTER — Encounter (INDEPENDENT_AMBULATORY_CARE_PROVIDER_SITE_OTHER): Payer: HMO | Admitting: Ophthalmology

## 2022-03-20 ENCOUNTER — Encounter (INDEPENDENT_AMBULATORY_CARE_PROVIDER_SITE_OTHER): Payer: Medicare (Managed Care) | Admitting: Ophthalmology

## 2022-03-20 DIAGNOSIS — E113511 Type 2 diabetes mellitus with proliferative diabetic retinopathy with macular edema, right eye: Secondary | ICD-10-CM | POA: Diagnosis not present

## 2022-03-20 DIAGNOSIS — H35031 Hypertensive retinopathy, right eye: Secondary | ICD-10-CM | POA: Diagnosis not present

## 2022-03-20 DIAGNOSIS — I10 Essential (primary) hypertension: Secondary | ICD-10-CM | POA: Diagnosis not present

## 2022-03-20 DIAGNOSIS — H43811 Vitreous degeneration, right eye: Secondary | ICD-10-CM | POA: Diagnosis not present

## 2022-04-17 ENCOUNTER — Encounter (INDEPENDENT_AMBULATORY_CARE_PROVIDER_SITE_OTHER): Payer: Medicare (Managed Care) | Admitting: Ophthalmology

## 2022-05-05 ENCOUNTER — Encounter (INDEPENDENT_AMBULATORY_CARE_PROVIDER_SITE_OTHER): Payer: Medicare (Managed Care) | Admitting: Ophthalmology

## 2022-05-05 DIAGNOSIS — H35031 Hypertensive retinopathy, right eye: Secondary | ICD-10-CM

## 2022-05-05 DIAGNOSIS — H43811 Vitreous degeneration, right eye: Secondary | ICD-10-CM | POA: Diagnosis not present

## 2022-05-05 DIAGNOSIS — E113511 Type 2 diabetes mellitus with proliferative diabetic retinopathy with macular edema, right eye: Secondary | ICD-10-CM

## 2022-05-05 DIAGNOSIS — I10 Essential (primary) hypertension: Secondary | ICD-10-CM

## 2022-06-02 ENCOUNTER — Encounter (INDEPENDENT_AMBULATORY_CARE_PROVIDER_SITE_OTHER): Payer: Medicare (Managed Care) | Admitting: Ophthalmology

## 2022-06-02 DIAGNOSIS — E113511 Type 2 diabetes mellitus with proliferative diabetic retinopathy with macular edema, right eye: Secondary | ICD-10-CM

## 2022-06-02 DIAGNOSIS — I10 Essential (primary) hypertension: Secondary | ICD-10-CM | POA: Diagnosis not present

## 2022-06-02 DIAGNOSIS — H35031 Hypertensive retinopathy, right eye: Secondary | ICD-10-CM | POA: Diagnosis not present

## 2022-06-02 DIAGNOSIS — H43811 Vitreous degeneration, right eye: Secondary | ICD-10-CM | POA: Diagnosis not present

## 2022-06-30 ENCOUNTER — Encounter (INDEPENDENT_AMBULATORY_CARE_PROVIDER_SITE_OTHER): Payer: Medicare (Managed Care) | Admitting: Ophthalmology

## 2022-06-30 DIAGNOSIS — E113511 Type 2 diabetes mellitus with proliferative diabetic retinopathy with macular edema, right eye: Secondary | ICD-10-CM | POA: Diagnosis not present

## 2022-06-30 DIAGNOSIS — I10 Essential (primary) hypertension: Secondary | ICD-10-CM | POA: Diagnosis not present

## 2022-06-30 DIAGNOSIS — H43811 Vitreous degeneration, right eye: Secondary | ICD-10-CM | POA: Diagnosis not present

## 2022-06-30 DIAGNOSIS — H35031 Hypertensive retinopathy, right eye: Secondary | ICD-10-CM | POA: Diagnosis not present

## 2022-07-28 ENCOUNTER — Encounter (INDEPENDENT_AMBULATORY_CARE_PROVIDER_SITE_OTHER): Payer: Medicare (Managed Care) | Admitting: Ophthalmology

## 2022-07-28 DIAGNOSIS — I10 Essential (primary) hypertension: Secondary | ICD-10-CM

## 2022-07-28 DIAGNOSIS — E113511 Type 2 diabetes mellitus with proliferative diabetic retinopathy with macular edema, right eye: Secondary | ICD-10-CM | POA: Diagnosis not present

## 2022-07-28 DIAGNOSIS — H35031 Hypertensive retinopathy, right eye: Secondary | ICD-10-CM | POA: Diagnosis not present

## 2022-07-28 DIAGNOSIS — H43811 Vitreous degeneration, right eye: Secondary | ICD-10-CM | POA: Diagnosis not present

## 2022-08-25 ENCOUNTER — Encounter (INDEPENDENT_AMBULATORY_CARE_PROVIDER_SITE_OTHER): Payer: Medicare (Managed Care) | Admitting: Ophthalmology

## 2022-08-25 DIAGNOSIS — H35031 Hypertensive retinopathy, right eye: Secondary | ICD-10-CM | POA: Diagnosis not present

## 2022-08-25 DIAGNOSIS — I10 Essential (primary) hypertension: Secondary | ICD-10-CM

## 2022-08-25 DIAGNOSIS — H43811 Vitreous degeneration, right eye: Secondary | ICD-10-CM

## 2022-08-25 DIAGNOSIS — E113511 Type 2 diabetes mellitus with proliferative diabetic retinopathy with macular edema, right eye: Secondary | ICD-10-CM

## 2022-09-29 ENCOUNTER — Encounter (INDEPENDENT_AMBULATORY_CARE_PROVIDER_SITE_OTHER): Payer: Medicare (Managed Care) | Admitting: Ophthalmology

## 2022-10-09 ENCOUNTER — Encounter (INDEPENDENT_AMBULATORY_CARE_PROVIDER_SITE_OTHER): Payer: Medicare (Managed Care) | Admitting: Ophthalmology

## 2022-10-09 DIAGNOSIS — Z7984 Long term (current) use of oral hypoglycemic drugs: Secondary | ICD-10-CM

## 2022-10-09 DIAGNOSIS — H35031 Hypertensive retinopathy, right eye: Secondary | ICD-10-CM

## 2022-10-09 DIAGNOSIS — H43811 Vitreous degeneration, right eye: Secondary | ICD-10-CM | POA: Diagnosis not present

## 2022-10-09 DIAGNOSIS — I10 Essential (primary) hypertension: Secondary | ICD-10-CM | POA: Diagnosis not present

## 2022-10-09 DIAGNOSIS — E113511 Type 2 diabetes mellitus with proliferative diabetic retinopathy with macular edema, right eye: Secondary | ICD-10-CM

## 2022-11-06 ENCOUNTER — Encounter (INDEPENDENT_AMBULATORY_CARE_PROVIDER_SITE_OTHER): Payer: Medicare (Managed Care) | Admitting: Ophthalmology

## 2022-11-06 DIAGNOSIS — H35031 Hypertensive retinopathy, right eye: Secondary | ICD-10-CM | POA: Diagnosis not present

## 2022-11-06 DIAGNOSIS — H43811 Vitreous degeneration, right eye: Secondary | ICD-10-CM

## 2022-11-06 DIAGNOSIS — E113511 Type 2 diabetes mellitus with proliferative diabetic retinopathy with macular edema, right eye: Secondary | ICD-10-CM

## 2022-11-06 DIAGNOSIS — I10 Essential (primary) hypertension: Secondary | ICD-10-CM

## 2022-11-06 DIAGNOSIS — Z7984 Long term (current) use of oral hypoglycemic drugs: Secondary | ICD-10-CM

## 2022-12-04 ENCOUNTER — Encounter (INDEPENDENT_AMBULATORY_CARE_PROVIDER_SITE_OTHER): Payer: Medicare (Managed Care) | Admitting: Ophthalmology

## 2022-12-04 DIAGNOSIS — I10 Essential (primary) hypertension: Secondary | ICD-10-CM

## 2022-12-04 DIAGNOSIS — E113511 Type 2 diabetes mellitus with proliferative diabetic retinopathy with macular edema, right eye: Secondary | ICD-10-CM

## 2022-12-04 DIAGNOSIS — H35031 Hypertensive retinopathy, right eye: Secondary | ICD-10-CM | POA: Diagnosis not present

## 2022-12-04 DIAGNOSIS — Z7984 Long term (current) use of oral hypoglycemic drugs: Secondary | ICD-10-CM

## 2022-12-04 DIAGNOSIS — H43811 Vitreous degeneration, right eye: Secondary | ICD-10-CM | POA: Diagnosis not present

## 2023-01-01 ENCOUNTER — Encounter (INDEPENDENT_AMBULATORY_CARE_PROVIDER_SITE_OTHER): Payer: Medicare (Managed Care) | Admitting: Ophthalmology

## 2023-01-01 DIAGNOSIS — Z7984 Long term (current) use of oral hypoglycemic drugs: Secondary | ICD-10-CM

## 2023-01-01 DIAGNOSIS — I10 Essential (primary) hypertension: Secondary | ICD-10-CM

## 2023-01-01 DIAGNOSIS — E113511 Type 2 diabetes mellitus with proliferative diabetic retinopathy with macular edema, right eye: Secondary | ICD-10-CM

## 2023-01-01 DIAGNOSIS — H35031 Hypertensive retinopathy, right eye: Secondary | ICD-10-CM

## 2023-01-01 DIAGNOSIS — H43811 Vitreous degeneration, right eye: Secondary | ICD-10-CM

## 2023-01-29 ENCOUNTER — Encounter (INDEPENDENT_AMBULATORY_CARE_PROVIDER_SITE_OTHER): Payer: Medicare (Managed Care) | Admitting: Ophthalmology

## 2023-01-31 ENCOUNTER — Encounter (INDEPENDENT_AMBULATORY_CARE_PROVIDER_SITE_OTHER): Payer: Medicare (Managed Care) | Admitting: Ophthalmology

## 2023-02-06 ENCOUNTER — Encounter (INDEPENDENT_AMBULATORY_CARE_PROVIDER_SITE_OTHER): Payer: Medicare (Managed Care) | Admitting: Ophthalmology

## 2023-02-06 DIAGNOSIS — I10 Essential (primary) hypertension: Secondary | ICD-10-CM | POA: Diagnosis not present

## 2023-02-06 DIAGNOSIS — H35031 Hypertensive retinopathy, right eye: Secondary | ICD-10-CM | POA: Diagnosis not present

## 2023-02-06 DIAGNOSIS — H43811 Vitreous degeneration, right eye: Secondary | ICD-10-CM

## 2023-02-06 DIAGNOSIS — E113511 Type 2 diabetes mellitus with proliferative diabetic retinopathy with macular edema, right eye: Secondary | ICD-10-CM | POA: Diagnosis not present

## 2023-02-06 DIAGNOSIS — Z7984 Long term (current) use of oral hypoglycemic drugs: Secondary | ICD-10-CM | POA: Diagnosis not present

## 2023-03-06 ENCOUNTER — Encounter (INDEPENDENT_AMBULATORY_CARE_PROVIDER_SITE_OTHER): Payer: Medicare (Managed Care) | Admitting: Ophthalmology

## 2023-03-06 DIAGNOSIS — E113511 Type 2 diabetes mellitus with proliferative diabetic retinopathy with macular edema, right eye: Secondary | ICD-10-CM

## 2023-03-06 DIAGNOSIS — H43811 Vitreous degeneration, right eye: Secondary | ICD-10-CM

## 2023-03-06 DIAGNOSIS — I10 Essential (primary) hypertension: Secondary | ICD-10-CM | POA: Diagnosis not present

## 2023-03-06 DIAGNOSIS — H35031 Hypertensive retinopathy, right eye: Secondary | ICD-10-CM

## 2023-03-06 DIAGNOSIS — Z7984 Long term (current) use of oral hypoglycemic drugs: Secondary | ICD-10-CM | POA: Diagnosis not present

## 2023-04-02 ENCOUNTER — Encounter (INDEPENDENT_AMBULATORY_CARE_PROVIDER_SITE_OTHER): Payer: Medicare (Managed Care) | Admitting: Ophthalmology

## 2023-04-02 DIAGNOSIS — H35031 Hypertensive retinopathy, right eye: Secondary | ICD-10-CM | POA: Diagnosis not present

## 2023-04-02 DIAGNOSIS — I10 Essential (primary) hypertension: Secondary | ICD-10-CM

## 2023-04-02 DIAGNOSIS — E113511 Type 2 diabetes mellitus with proliferative diabetic retinopathy with macular edema, right eye: Secondary | ICD-10-CM

## 2023-04-02 DIAGNOSIS — Z7984 Long term (current) use of oral hypoglycemic drugs: Secondary | ICD-10-CM | POA: Diagnosis not present

## 2023-04-02 DIAGNOSIS — H43811 Vitreous degeneration, right eye: Secondary | ICD-10-CM

## 2023-04-30 ENCOUNTER — Encounter (INDEPENDENT_AMBULATORY_CARE_PROVIDER_SITE_OTHER): Payer: Medicare (Managed Care) | Admitting: Ophthalmology

## 2023-05-01 ENCOUNTER — Encounter (INDEPENDENT_AMBULATORY_CARE_PROVIDER_SITE_OTHER): Payer: Medicare (Managed Care) | Admitting: Ophthalmology

## 2023-05-01 DIAGNOSIS — E113511 Type 2 diabetes mellitus with proliferative diabetic retinopathy with macular edema, right eye: Secondary | ICD-10-CM

## 2023-05-01 DIAGNOSIS — I10 Essential (primary) hypertension: Secondary | ICD-10-CM

## 2023-05-01 DIAGNOSIS — H35031 Hypertensive retinopathy, right eye: Secondary | ICD-10-CM | POA: Diagnosis not present

## 2023-05-01 DIAGNOSIS — Z7984 Long term (current) use of oral hypoglycemic drugs: Secondary | ICD-10-CM

## 2023-05-01 DIAGNOSIS — H43811 Vitreous degeneration, right eye: Secondary | ICD-10-CM

## 2023-06-06 ENCOUNTER — Encounter (INDEPENDENT_AMBULATORY_CARE_PROVIDER_SITE_OTHER): Payer: Medicare (Managed Care) | Admitting: Ophthalmology

## 2023-06-08 ENCOUNTER — Encounter (INDEPENDENT_AMBULATORY_CARE_PROVIDER_SITE_OTHER): Payer: Medicare (Managed Care) | Admitting: Ophthalmology

## 2023-06-08 DIAGNOSIS — E113511 Type 2 diabetes mellitus with proliferative diabetic retinopathy with macular edema, right eye: Secondary | ICD-10-CM

## 2023-06-08 DIAGNOSIS — I10 Essential (primary) hypertension: Secondary | ICD-10-CM

## 2023-06-08 DIAGNOSIS — H43811 Vitreous degeneration, right eye: Secondary | ICD-10-CM | POA: Diagnosis not present

## 2023-06-08 DIAGNOSIS — H35031 Hypertensive retinopathy, right eye: Secondary | ICD-10-CM

## 2023-07-10 ENCOUNTER — Encounter (INDEPENDENT_AMBULATORY_CARE_PROVIDER_SITE_OTHER): Payer: Medicare (Managed Care) | Admitting: Ophthalmology

## 2023-07-10 DIAGNOSIS — Z7984 Long term (current) use of oral hypoglycemic drugs: Secondary | ICD-10-CM

## 2023-07-10 DIAGNOSIS — H43811 Vitreous degeneration, right eye: Secondary | ICD-10-CM

## 2023-07-10 DIAGNOSIS — H35031 Hypertensive retinopathy, right eye: Secondary | ICD-10-CM | POA: Diagnosis not present

## 2023-07-10 DIAGNOSIS — E113511 Type 2 diabetes mellitus with proliferative diabetic retinopathy with macular edema, right eye: Secondary | ICD-10-CM

## 2023-07-10 DIAGNOSIS — I10 Essential (primary) hypertension: Secondary | ICD-10-CM

## 2023-08-07 ENCOUNTER — Encounter (INDEPENDENT_AMBULATORY_CARE_PROVIDER_SITE_OTHER): Payer: Medicare (Managed Care) | Admitting: Ophthalmology

## 2023-08-07 DIAGNOSIS — I10 Essential (primary) hypertension: Secondary | ICD-10-CM | POA: Diagnosis not present

## 2023-08-07 DIAGNOSIS — H35031 Hypertensive retinopathy, right eye: Secondary | ICD-10-CM

## 2023-08-07 DIAGNOSIS — E113511 Type 2 diabetes mellitus with proliferative diabetic retinopathy with macular edema, right eye: Secondary | ICD-10-CM

## 2023-08-07 DIAGNOSIS — H43811 Vitreous degeneration, right eye: Secondary | ICD-10-CM

## 2023-08-07 DIAGNOSIS — Z7984 Long term (current) use of oral hypoglycemic drugs: Secondary | ICD-10-CM | POA: Diagnosis not present

## 2023-09-11 ENCOUNTER — Encounter (INDEPENDENT_AMBULATORY_CARE_PROVIDER_SITE_OTHER): Payer: Medicare (Managed Care) | Admitting: Ophthalmology

## 2023-09-11 DIAGNOSIS — H43811 Vitreous degeneration, right eye: Secondary | ICD-10-CM | POA: Diagnosis not present

## 2023-09-11 DIAGNOSIS — Z7984 Long term (current) use of oral hypoglycemic drugs: Secondary | ICD-10-CM | POA: Diagnosis not present

## 2023-09-11 DIAGNOSIS — E113511 Type 2 diabetes mellitus with proliferative diabetic retinopathy with macular edema, right eye: Secondary | ICD-10-CM

## 2023-09-11 DIAGNOSIS — I10 Essential (primary) hypertension: Secondary | ICD-10-CM

## 2023-09-11 DIAGNOSIS — H35031 Hypertensive retinopathy, right eye: Secondary | ICD-10-CM | POA: Diagnosis not present

## 2023-10-02 ENCOUNTER — Ambulatory Visit (INDEPENDENT_AMBULATORY_CARE_PROVIDER_SITE_OTHER): Admitting: "Endocrinology

## 2023-10-02 ENCOUNTER — Encounter: Payer: Self-pay | Admitting: "Endocrinology

## 2023-10-02 VITALS — BP 140/80 | HR 96 | Ht 68.0 in | Wt 168.0 lb

## 2023-10-02 DIAGNOSIS — E119 Type 2 diabetes mellitus without complications: Secondary | ICD-10-CM

## 2023-10-02 DIAGNOSIS — Z7984 Long term (current) use of oral hypoglycemic drugs: Secondary | ICD-10-CM | POA: Diagnosis not present

## 2023-10-02 LAB — POCT GLYCOSYLATED HEMOGLOBIN (HGB A1C): Hemoglobin A1C: 6.5 % — AB (ref 4.0–5.6)

## 2023-10-02 MED ORDER — BLOOD GLUCOSE TEST VI STRP: 1.0000 | ORAL_STRIP | Freq: Three times a day (TID) | 3 refills | Status: AC

## 2023-10-02 MED ORDER — BLOOD GLUCOSE MONITORING SUPPL DEVI: 1.0000 | Freq: Three times a day (TID) | 0 refills | Status: AC

## 2023-10-02 MED ORDER — LANCETS MISC. MISC: 1.0000 | Freq: Three times a day (TID) | 3 refills | Status: AC

## 2023-10-02 MED ORDER — LANCET DEVICE MISC: 1.0000 | Freq: Three times a day (TID) | 0 refills | Status: AC

## 2023-10-02 NOTE — Progress Notes (Signed)
 Patient ID: Scott Reynolds, male   DOB: [birth year], 83 y.o.   MRN: [id number]   Reason for Appointment:   follow-up of diabetes   History of Present Illness   DIABETES:  He has had diabetes since 2001 which is usually well controlled.  Previously was taking glipizide  and Actoplusmet but after about 2013  he was able to get consistent control with metformin  2000 mg only.  Oral hypoglycemic drugs used: Metformin  1 g once daily  Stopped Glipizide  ER 2.5 mg daily  He has regular eye exams from Dr. Gwendalyn Lemma  Home blood sugars not available  Complications: bilateral leg amputation  Wt Readings from Last 3 Encounters:  10/02/23 168 lb (76.2 kg)  09/14/21 193 lb 6.4 oz (87.7 kg)  09/08/21 205 lb 14.6 oz (93.4 kg)   Lab Results  Component Value Date   HGBA1C 6.5 (A) 10/02/2023   HGBA1C 5.5 08/15/2021   HGBA1C 5.8 (A) 05/06/2021   Lab Results  Component Value Date   MICROALBUR 2.2 (H) 01/11/2021   LDLCALC 52 05/06/2021   CREATININE 0.69 09/11/2021     HYPERLIPIDEMIA:  Has a history of increased LDL; he has had good control with simvastatin  20 mg Labs previously:   Lab Results  Component Value Date   CHOL 113 05/06/2021   HDL 52.00 05/06/2021   LDLCALC 52 05/06/2021   TRIG 47.0 05/06/2021   CHOLHDL 2 05/06/2021   HYPERTENSION: See review of systems On losartan  25 mg every day    Allergies as of 10/02/2023   No Known Allergies      Medication List        Accurate as of Oct 02, 2023 11:48 AM. If you have any questions, ask your nurse or doctor.          acetaZOLAMIDE 250 MG tablet Commonly known as: DIAMOX Take 500 mg by mouth 2 (two) times daily.   aspirin  325 MG tablet Take 325 mg by mouth daily.   Blood Glucose Monitoring Suppl Devi 1 each by Does not apply route in the morning, at noon, and at bedtime. May substitute to any manufacturer covered by patient's insurance. Started by: Jorge Newcomer   BLOOD GLUCOSE TEST STRIPS Strp 1 each by In  Vitro route in the morning, at noon, and at bedtime. May substitute to any manufacturer covered by patient's insurance. Started by: Deiona Hooper   collagenase 250 UNIT/GM ointment Commonly known as: SANTYL Apply 1 application. topically daily. Cleanse sacrum with n/s Apply santyl. hydrofera blue and secondary dressing daily   dorzolamide  2 % ophthalmic solution Commonly known as: TRUSOPT  Place 1 drop into both eyes 2 (two) times daily.   folic acid  1 MG tablet Commonly known as: FOLVITE  Take 1 tablet (1 mg total) by mouth daily.   Gerhardt's butt cream Crea Apply 1 application. topically 4 (four) times daily.   Lancet Device Misc 1 each by Does not apply route in the morning, at noon, and at bedtime. May substitute to any manufacturer covered by patient's insurance. Started by: Jorge Newcomer   Lancets Misc. Misc 1 each by Does not apply route in the morning, at noon, and at bedtime. May substitute to any manufacturer covered by patient's insurance. Started by: Jorge Newcomer   losartan  25 MG tablet Commonly known as: COZAAR  Take 1 tablet (25 mg total) by mouth daily.   metFORMIN  1000 MG tablet Commonly known as: GLUCOPHAGE  TAKE 1 TABLET BY MOUTH TWICE A DAY   NON FORMULARY Med Pass 120  ml by mouth twice a day for supplement and wound healing   polyethylene glycol 17 g packet Commonly known as: MIRALAX  / GLYCOLAX  Take 17 g by mouth daily as needed.   prednisoLONE  acetate 1 % ophthalmic suspension Commonly known as: PRED FORTE  Place 1 drop into both eyes 4 (four) times daily.   senna-docusate 8.6-50 MG tablet Commonly known as: Senokot-S Take 1 tablet by mouth 2 (two) times daily.   simvastatin  20 MG tablet Commonly known as: ZOCOR  TAKE 1 TABLET BY MOUTH EVERYDAY AT BEDTIME        Allergies: No Known Allergies  Past Medical History:  Diagnosis Date   Diabetes mellitus without complication (HCC)    Erectile dysfunction    Glaucoma    Hyperlipidemia     Hypertension     Past Surgical History:  Procedure Laterality Date   LEG AMPUTATION ABOVE KNEE Bilateral    2001 left leg and 2003 right leg    Family History  Problem Relation Age of Onset   Diabetes Brother    Cancer Neg Hx     Social History:  reports that he has never smoked. He has never used smokeless tobacco. No history on file for alcohol use and drug use.   ROS Same as above   BP Readings from Last 3 Encounters:  10/02/23 (!) 140/80  09/15/21 (!) 108/54  09/14/21 101/78   Lab Results  Component Value Date   CREATININE 0.69 09/11/2021   CREATININE 0.79 09/10/2021   CREATININE 0.65 09/09/2021    Lab Results  Component Value Date   VITAMINB12 2,489 (H) 09/10/2021   Examination:   BP (!) 140/80   Pulse 96   Ht 5\' 8"  (1.727 m)   Wt 168 lb (76.2 kg)   SpO2 100%   BMI 25.54 kg/m   Body mass index is 25.54 kg/m.   Physical Exam   Physical Exam Constitutional:      Appearance: Normal appearance. Not ill-appearing.  HENT:     Head: Normocephalic and atraumatic.  Eyes:     General: No scleral icterus.    Conjunctiva/sclera: Conjunctivae normal.  Pulmonary:     Effort: Pulmonary effort is normal. No respiratory distress.  Musculoskeletal:     Cervical back: Normal range of motion. No rigidity.  Skin:    Coloration: Skin is not jaundiced.     Findings: No rash.  Neurological:     Mental Status: Alert and oriented to person, place, and time. Psychiatric:        Behavior: Behavior normal.        Judgment: Judgment normal.   Assesment:  Diagnoses and all orders for this visit:  Controlled type 2 diabetes mellitus without complication, without long-term current use of insulin (HCC) -     Lipid panel -     Microalbumin / creatinine urine ratio -     Comprehensive metabolic panel with GFR -     POCT glycosylated hemoglobin (Hb A1C)  Other orders -     Blood Glucose Monitoring Suppl DEVI; 1 each by Does not apply route in the morning, at noon,  and at bedtime. May substitute to any manufacturer covered by patient's insurance. -     Glucose Blood (BLOOD GLUCOSE TEST STRIPS) STRP; 1 each by In Vitro route in the morning, at noon, and at bedtime. May substitute to any manufacturer covered by patient's insurance. -     Lancet Device MISC; 1 each by Does not apply route in the morning,  at noon, and at bedtime. May substitute to any manufacturer covered by patient's insurance. -     Lancets Misc. MISC; 1 each by Does not apply route in the morning, at noon, and at bedtime. May substitute to any manufacturer covered by patient's insurance.   Diabetes type 2 with ?retinopathy, h/o bilateral leg amputation No MI/stroke/neuropathy/microalbuminuria  His diabetes well controlled on metformin  1 g once a day A1c is 6.5% on 10/02/23   Lab Results  Component Value Date   HGBA1C 6.5 (A) 10/02/2023   HGBA1C 5.5 08/15/2021   HGBA1C 5.8 (A) 05/06/2021   No symptoms of hypoglycemia He does not monitor at home and not clear if he may have some postprandial high sugars  Was on losartan  25 mg every day-ran out? On simvastatin  20 mg every day  10/02/23: Sent blood sugar checking supplies, reminded him to check at least if doesn't feel well   LIPIDS: Needs follow-up today  Per prior endocrinologist Dr Hubert Madden: He still has not called to establish with a new PCP, has been advised to do so several times and has been given a list of available PCPs 10/02/23 Requests all refills, again says has no PCP, last blood work in 2023. Encouraged to find a PCP to get refills on medications other than diabetes medication   Will needs labs before can refill on diabetes medication    Return in about 3 months (around 01/02/2024) for visit, labs today.   Scott Reynolds 10/02/2023, 11:48 AM

## 2023-10-03 ENCOUNTER — Telehealth: Payer: Self-pay

## 2023-10-03 LAB — COMPREHENSIVE METABOLIC PANEL WITH GFR
AG Ratio: 1.6 (calc) (ref 1.0–2.5)
ALT: 10 U/L (ref 9–46)
AST: 12 U/L (ref 10–35)
Albumin: 4.4 g/dL (ref 3.6–5.1)
Alkaline phosphatase (APISO): 71 U/L (ref 35–144)
BUN: 16 mg/dL (ref 7–25)
CO2: 32 mmol/L (ref 20–32)
Calcium: 9.9 mg/dL (ref 8.6–10.3)
Chloride: 103 mmol/L (ref 98–110)
Creat: 0.84 mg/dL (ref 0.70–1.22)
Globulin: 2.7 g/dL (ref 1.9–3.7)
Glucose, Bld: 114 mg/dL (ref 65–139)
Potassium: 4.6 mmol/L (ref 3.5–5.3)
Sodium: 142 mmol/L (ref 135–146)
Total Bilirubin: 1.2 mg/dL (ref 0.2–1.2)
Total Protein: 7.1 g/dL (ref 6.1–8.1)
eGFR: 87 mL/min/{1.73_m2} (ref >=60)

## 2023-10-03 LAB — MICROALBUMIN / CREATININE URINE RATIO
Creatinine, Urine: 101 mg/dL (ref 20–320)
Microalb Creat Ratio: 10 mg/g{creat} (ref <30)
Microalb, Ur: 1 mg/dL

## 2023-10-03 LAB — LIPID PANEL
Cholesterol: 127 mg/dL (ref <200)
HDL: 55 mg/dL (ref >=40)
LDL Cholesterol (Calc): 59 mg/dL
Non-HDL Cholesterol (Calc): 72 mg/dL (ref <130)
Total CHOL/HDL Ratio: 2.3 (calc) (ref <5.0)
Triglycerides: 58 mg/dL (ref <150)

## 2023-10-03 NOTE — Telephone Encounter (Signed)
 Patient called left VM requesting to make an appointment.

## 2023-10-04 ENCOUNTER — Ambulatory Visit: Payer: Self-pay | Admitting: "Endocrinology

## 2023-10-04 ENCOUNTER — Other Ambulatory Visit: Payer: Self-pay | Admitting: "Endocrinology

## 2023-10-04 DIAGNOSIS — E119 Type 2 diabetes mellitus without complications: Secondary | ICD-10-CM

## 2023-10-04 MED ORDER — METFORMIN HCL 1000 MG PO TABS: 1000.0000 mg | ORAL_TABLET | Freq: Two times a day (BID) | ORAL | 1 refills | Status: DC

## 2023-10-04 MED ORDER — SIMVASTATIN 20 MG PO TABS: ORAL_TABLET | ORAL | 1 refills | Status: AC

## 2023-10-09 ENCOUNTER — Telehealth: Payer: Self-pay | Admitting: "Endocrinology

## 2023-10-09 ENCOUNTER — Encounter (INDEPENDENT_AMBULATORY_CARE_PROVIDER_SITE_OTHER): Admitting: Ophthalmology

## 2023-10-09 DIAGNOSIS — H43811 Vitreous degeneration, right eye: Secondary | ICD-10-CM | POA: Diagnosis not present

## 2023-10-09 DIAGNOSIS — Z7984 Long term (current) use of oral hypoglycemic drugs: Secondary | ICD-10-CM

## 2023-10-09 DIAGNOSIS — I10 Essential (primary) hypertension: Secondary | ICD-10-CM | POA: Diagnosis not present

## 2023-10-09 DIAGNOSIS — E113511 Type 2 diabetes mellitus with proliferative diabetic retinopathy with macular edema, right eye: Secondary | ICD-10-CM | POA: Diagnosis not present

## 2023-10-09 DIAGNOSIS — H35031 Hypertensive retinopathy, right eye: Secondary | ICD-10-CM | POA: Diagnosis not present

## 2023-10-09 NOTE — Telephone Encounter (Signed)
 MEDICATION:  losartan  losartan  (COZAAR ) 25 MG tablet  PHARMACY:    [facility] - [place], [place] - [address] (Ph: [phone])    HAS THE PATIENT CONTACTED THEIR PHARMACY?  Yes  IS THIS A 90 DAY SUPPLY : Yes  IS PATIENT OUT OF MEDICATION: Yes  IF NOT; HOW MUCH IS LEFT:   LAST APPOINTMENT DATE: @5 /13/2025  NEXT APPOINTMENT DATE:@8 /18/2025  DO WE HAVE YOUR PERMISSION TO LEAVE A DETAILED MESSAGE?:Yes  OTHER COMMENTS: Patient is out of medication   **Let patient know to contact pharmacy at the end of the day to make sure medication is ready. **  ** Please notify patient to allow 48-72 hours to process**  **Encourage patient to contact the pharmacy for refills or they can request refills through [facility]**

## 2023-10-09 NOTE — Telephone Encounter (Signed)
 Called pt and informed him that he would to call his PCP for refill of losartan .

## 2023-10-30 DIAGNOSIS — H16223 Keratoconjunctivitis sicca, not specified as Sjogren's, bilateral: Secondary | ICD-10-CM | POA: Diagnosis not present

## 2023-10-30 DIAGNOSIS — H35033 Hypertensive retinopathy, bilateral: Secondary | ICD-10-CM | POA: Diagnosis not present

## 2023-10-30 DIAGNOSIS — H35371 Puckering of macula, right eye: Secondary | ICD-10-CM | POA: Diagnosis not present

## 2023-10-30 DIAGNOSIS — H401113 Primary open-angle glaucoma, right eye, severe stage: Secondary | ICD-10-CM | POA: Diagnosis not present

## 2023-11-06 ENCOUNTER — Encounter (INDEPENDENT_AMBULATORY_CARE_PROVIDER_SITE_OTHER): Admitting: Ophthalmology

## 2023-11-06 DIAGNOSIS — I10 Essential (primary) hypertension: Secondary | ICD-10-CM

## 2023-11-06 DIAGNOSIS — Z7984 Long term (current) use of oral hypoglycemic drugs: Secondary | ICD-10-CM | POA: Diagnosis not present

## 2023-11-06 DIAGNOSIS — H43811 Vitreous degeneration, right eye: Secondary | ICD-10-CM

## 2023-11-06 DIAGNOSIS — E113511 Type 2 diabetes mellitus with proliferative diabetic retinopathy with macular edema, right eye: Secondary | ICD-10-CM

## 2023-11-06 DIAGNOSIS — H35031 Hypertensive retinopathy, right eye: Secondary | ICD-10-CM

## 2023-12-04 ENCOUNTER — Encounter (INDEPENDENT_AMBULATORY_CARE_PROVIDER_SITE_OTHER): Admitting: Ophthalmology

## 2023-12-04 DIAGNOSIS — H35031 Hypertensive retinopathy, right eye: Secondary | ICD-10-CM

## 2023-12-04 DIAGNOSIS — Z7984 Long term (current) use of oral hypoglycemic drugs: Secondary | ICD-10-CM | POA: Diagnosis not present

## 2023-12-04 DIAGNOSIS — H43811 Vitreous degeneration, right eye: Secondary | ICD-10-CM | POA: Diagnosis not present

## 2023-12-04 DIAGNOSIS — E113511 Type 2 diabetes mellitus with proliferative diabetic retinopathy with macular edema, right eye: Secondary | ICD-10-CM

## 2023-12-04 DIAGNOSIS — I10 Essential (primary) hypertension: Secondary | ICD-10-CM

## 2023-12-07 ENCOUNTER — Encounter: Admitting: Family Medicine

## 2024-01-01 ENCOUNTER — Encounter (INDEPENDENT_AMBULATORY_CARE_PROVIDER_SITE_OTHER): Admitting: Ophthalmology

## 2024-01-01 DIAGNOSIS — H35031 Hypertensive retinopathy, right eye: Secondary | ICD-10-CM | POA: Diagnosis not present

## 2024-01-01 DIAGNOSIS — I10 Essential (primary) hypertension: Secondary | ICD-10-CM | POA: Diagnosis not present

## 2024-01-01 DIAGNOSIS — E113511 Type 2 diabetes mellitus with proliferative diabetic retinopathy with macular edema, right eye: Secondary | ICD-10-CM | POA: Diagnosis not present

## 2024-01-01 DIAGNOSIS — Z7984 Long term (current) use of oral hypoglycemic drugs: Secondary | ICD-10-CM

## 2024-01-01 DIAGNOSIS — H43811 Vitreous degeneration, right eye: Secondary | ICD-10-CM

## 2024-01-07 ENCOUNTER — Encounter: Payer: Self-pay | Admitting: "Endocrinology

## 2024-01-07 ENCOUNTER — Ambulatory Visit (INDEPENDENT_AMBULATORY_CARE_PROVIDER_SITE_OTHER): Admitting: "Endocrinology

## 2024-01-07 VITALS — BP 130/70 | HR 110 | Ht 68.0 in | Wt 157.0 lb

## 2024-01-07 DIAGNOSIS — E119 Type 2 diabetes mellitus without complications: Secondary | ICD-10-CM

## 2024-01-07 DIAGNOSIS — Z7984 Long term (current) use of oral hypoglycemic drugs: Secondary | ICD-10-CM

## 2024-01-07 DIAGNOSIS — E7800 Pure hypercholesterolemia, unspecified: Secondary | ICD-10-CM

## 2024-01-07 LAB — POCT GLYCOSYLATED HEMOGLOBIN (HGB A1C): Hemoglobin A1C: 4.8 % (ref 4.0–5.6)

## 2024-01-07 NOTE — Progress Notes (Signed)
 Patient ID: Scott Reynolds, male   DOB: 1941/04/09, 83 y.o.   MRN: [id number]   Reason for Appointment:   follow-up of diabetes   History of Present Illness   DIABETES:  He has had diabetes since 2001 which is usually well controlled.  Previously was taking glipizide  and Actoplusmet but after about 2013  he was able to get consistent control with metformin  2000 mg only.  On Metformin  1 g once daily  He has regular eye exams from Dr. Cyrus  Home blood sugars not available  Complications: bilateral leg amputation  Wt Readings from Last 3 Encounters:  01/07/24 157 lb (71.2 kg)  10/02/23 168 lb (76.2 kg)  09/14/21 193 lb 6.4 oz (87.7 kg)   Lab Results  Component Value Date   HGBA1C 4.8 01/07/2024   HGBA1C 6.5 (A) 10/02/2023   HGBA1C 5.5 08/15/2021   Lab Results  Component Value Date   MICROALBUR 1.0 10/02/2023   LDLCALC 59 10/02/2023   CREATININE 0.84 10/02/2023     HYPERLIPIDEMIA:  Has a history of increased LDL; he has had good control with simvastatin  20 mg Labs previously:   Lab Results  Component Value Date   CHOL 127 10/02/2023   HDL 55 10/02/2023   LDLCALC 59 10/02/2023   TRIG 58 10/02/2023   CHOLHDL 2.3 10/02/2023   HYPERTENSION: See review of systems On losartan  25 mg every day    Allergies as of 01/07/2024   No Known Allergies      Medication List        Accurate as of January 07, 2024  8:56 AM. If you have any questions, ask your nurse or doctor.          acetaZOLAMIDE 250 MG tablet Commonly known as: DIAMOX Take 500 mg by mouth 2 (two) times daily.   aspirin  325 MG tablet Take 325 mg by mouth daily.   Blood Glucose Monitoring Suppl Devi 1 each by Does not apply route in the morning, at noon, and at bedtime. May substitute to any manufacturer covered by patient's insurance.   BLOOD GLUCOSE TEST STRIPS Strp 1 each by In Vitro route in the morning, at noon, and at bedtime. May substitute to any manufacturer covered by patient's  insurance.   collagenase 250 UNIT/GM ointment Commonly known as: SANTYL Apply 1 application. topically daily. Cleanse sacrum with n/s Apply santyl. hydrofera blue and secondary dressing daily   dorzolamide  2 % ophthalmic solution Commonly known as: TRUSOPT  Place 1 drop into both eyes 2 (two) times daily.   folic acid  1 MG tablet Commonly known as: FOLVITE  Take 1 tablet (1 mg total) by mouth daily.   Gerhardt's butt cream Crea Apply 1 application. topically 4 (four) times daily.   losartan  25 MG tablet Commonly known as: COZAAR  Take 1 tablet (25 mg total) by mouth daily.   metFORMIN  1000 MG tablet Commonly known as: GLUCOPHAGE  Take 1 tablet (1,000 mg total) by mouth 2 (two) times daily.   NON FORMULARY Med Pass 120 ml by mouth twice a day for supplement and wound healing   polyethylene glycol 17 g packet Commonly known as: MIRALAX  / GLYCOLAX  Take 17 g by mouth daily as needed.   prednisoLONE  acetate 1 % ophthalmic suspension Commonly known as: PRED FORTE  Place 1 drop into both eyes 4 (four) times daily.   senna-docusate 8.6-50 MG tablet Commonly known as: Senokot-S Take 1 tablet by mouth 2 (two) times daily.   simvastatin  20 MG tablet Commonly known as:  ZOCOR  TAKE 1 TABLET BY MOUTH EVERYDAY AT BEDTIME        Allergies: No Known Allergies  Past Medical History:  Diagnosis Date   Diabetes mellitus without complication (HCC)    Erectile dysfunction    Glaucoma    Hyperlipidemia    Hypertension     Past Surgical History:  Procedure Laterality Date   LEG AMPUTATION ABOVE KNEE Bilateral    2001 left leg and 2003 right leg    Family History  Problem Relation Age of Onset   Diabetes Brother    Cancer Neg Hx     Social History:  reports that he has never smoked. He has never used smokeless tobacco. No history on file for alcohol use and drug use.   ROS Same as above   BP Readings from Last 3 Encounters:  01/07/24 130/70  10/02/23 (!) 140/80   09/15/21 (!) 108/54   Lab Results  Component Value Date   CREATININE 0.84 10/02/2023   CREATININE 0.69 09/11/2021   CREATININE 0.79 09/10/2021    Lab Results  Component Value Date   VITAMINB12 2,489 (H) 09/10/2021   Examination:   BP 130/70   Pulse (!) 110   Ht 5' 8 (1.727 m)   Wt 157 lb (71.2 kg)   SpO2 98%   BMI 23.87 kg/m   Body mass index is 23.87 kg/m.   Physical Exam   Physical Exam Constitutional:      Appearance: Normal appearance. Not ill-appearing.  HENT:     Head: Normocephalic and atraumatic.  Eyes:     General: No scleral icterus.    Conjunctiva/sclera: Conjunctivae normal.  Pulmonary:     Effort: Pulmonary effort is normal. No respiratory distress.  Musculoskeletal:     Cervical back: Normal range of motion. No rigidity.  Skin:    Coloration: Skin is not jaundiced.     Findings: No rash.  Neurological:     Mental Status: Alert and oriented to person, place, and time. Psychiatric:        Behavior: Behavior normal.        Judgment: Judgment normal.   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, h/o bilateral leg amputation No MI/stroke/neuropathy/microalbuminuria  His diabetes well controlled on metformin  1 g once a day  Lab Results  Component Value Date   HGBA1C 4.8 01/07/2024   HGBA1C 6.5 (A) 10/02/2023   HGBA1C 5.5 08/15/2021   No symptoms of hypoglycemia He does not monitor at home and not clear if he may have some postprandial high sugars  Was on losartan  25 mg every day On simvastatin  20 mg every day  10/02/23: Sent blood sugar checking supplies, reminded him to check at least if doesn't feel well  01/07/24: given low A1C to 4.8%, decreased metformin  to  1000 mg every day  09/2023: LIPIDS: LDL 59  Per prior endocrinologist Dr Von: He still has not called to establish with a new PCP, has been advised to do so several times and has been given a list of available PCPs 10/02/23 Requests all refills, again says has no PCP, last blood work in 2023. Encouraged to find a PCP to get refills on medications other than diabetes medication    No follow-ups  on file.   Chloe Miyoshi 01/07/2024, 8:56 AM

## 2024-01-07 NOTE — Patient Instructions (Signed)

## 2024-01-08 ENCOUNTER — Encounter: Payer: Self-pay | Admitting: Family Medicine

## 2024-01-08 DIAGNOSIS — E1165 Type 2 diabetes mellitus with hyperglycemia: Secondary | ICD-10-CM | POA: Insufficient documentation

## 2024-01-08 NOTE — Progress Notes (Deleted)
 New Patient Visit  Subjective:     Patient ID: Scott Reynolds, male    DOB: Jun 17, 1940, 83 y.o.   MRN: [id number]  No chief complaint on file.   HPI  Discussed the use of AI scribe software for clinical note transcription with the patient, who gave verbal consent to proceed.  History of Present Illness      ROS Per HPI  Outpatient Encounter Medications as of 01/08/2024  Medication Sig   acetaZOLAMIDE (DIAMOX) 250 MG tablet Take 500 mg by mouth 2 (two) times daily.   aspirin  325 MG tablet Take 325 mg by mouth daily.   Blood Glucose Monitoring Suppl DEVI 1 each by Does not apply route in the morning, at noon, and at bedtime. May substitute to any manufacturer covered by patient's insurance.   collagenase (SANTYL) 250 UNIT/GM ointment Apply 1 application. topically daily. Cleanse sacrum with n/s Apply santyl. hydrofera blue and secondary dressing daily   dorzolamide  (TRUSOPT ) 2 % ophthalmic solution Place 1 drop into both eyes 2 (two) times daily.   folic acid  (FOLVITE ) 1 MG tablet Take 1 tablet (1 mg total) by mouth daily.   Glucose Blood (BLOOD GLUCOSE TEST STRIPS) STRP 1 each by In Vitro route in the morning, at noon, and at bedtime. May substitute to any manufacturer covered by patient's insurance.   losartan  (COZAAR ) 25 MG tablet Take 1 tablet (25 mg total) by mouth daily.   metFORMIN  (GLUCOPHAGE ) 1000 MG tablet Take 1 tablet (1,000 mg total) by mouth 2 (two) times daily.   NON FORMULARY Med Pass 120 ml by mouth twice a day for supplement and wound healing   Nystatin (GERHARDT'S BUTT CREAM) CREA Apply 1 application. topically 4 (four) times daily.   polyethylene glycol (MIRALAX  / GLYCOLAX ) 17 g packet Take 17 g by mouth daily as needed.   prednisoLONE  acetate (PRED FORTE ) 1 % ophthalmic suspension Place 1 drop into both eyes 4 (four) times daily.   senna-docusate (SENOKOT-S) 8.6-50 MG tablet Take 1 tablet by mouth 2 (two) times daily.   simvastatin  (ZOCOR ) 20 MG tablet TAKE 1  TABLET BY MOUTH EVERYDAY AT BEDTIME   No facility-administered encounter medications on file as of 01/08/2024.    Past Medical History:  Diagnosis Date   Diabetes mellitus without complication (HCC)    Erectile dysfunction    Glaucoma    Hyperlipidemia    Hypertension     Past Surgical History:  Procedure Laterality Date   LEG AMPUTATION ABOVE KNEE Bilateral    2001 left leg and 2003 right leg    Family History  Problem Relation Age of Onset   Diabetes Brother    Cancer Neg Hx     Social History   Socioeconomic History   Marital status: Single    Spouse name: Not on file   Number of children: Not on file   Years of education: Not on file   Highest education level: Not on file  Occupational History   Not on file  Tobacco Use   Smoking status: Never   Smokeless tobacco: Never  Substance and Sexual Activity   Alcohol use: Not on file   Drug use: Not on file   Sexual activity: Not on file  Other Topics Concern   Not on file  Social History Narrative   Not on file   Social Drivers of Health   Financial Resource Strain: Not on file  Food Insecurity: Not on file  Transportation Needs: Not on file  Physical  Activity: Not on file  Stress: Not on file  Social Connections: Not on file  Intimate Partner Violence: Not on file       Objective:    There were no vitals taken for this visit.   Physical Exam Vitals and nursing note reviewed.  Constitutional:      General: He is not in acute distress.    Appearance: Normal appearance.  HENT:     Head: Normocephalic and atraumatic.     Right Ear: External ear normal.     Left Ear: External ear normal.     Nose: Nose normal.     Mouth/Throat:     Mouth: Mucous membranes are moist.     Pharynx: Oropharynx is clear.  Eyes:     Extraocular Movements: Extraocular movements intact.  Cardiovascular:     Rate and Rhythm: Normal rate and regular rhythm.     Pulses: Normal pulses.     Heart sounds: Normal heart  sounds.  Pulmonary:     Effort: Pulmonary effort is normal. No respiratory distress.     Breath sounds: Normal breath sounds. No wheezing, rhonchi or rales.  Musculoskeletal:        General: Normal range of motion.     Cervical back: Normal range of motion.     Right lower leg: No edema.     Left lower leg: No edema.  Lymphadenopathy:     Cervical: No cervical adenopathy.  Skin:    General: Skin is warm and dry.  Neurological:     General: No focal deficit present.     Mental Status: He is alert and oriented to person, place, and time.  Psychiatric:        Mood and Affect: Mood normal.        Behavior: Behavior normal.     No results found for any visits on 01/08/24.      Assessment & Plan:   Assessment and Plan Assessment & Plan      No orders of the defined types were placed in this encounter.    No orders of the defined types were placed in this encounter.   No follow-ups on file.  Corean LITTIE Ku, FNP

## 2024-01-08 NOTE — Patient Instructions (Incomplete)

## 2024-01-15 ENCOUNTER — Encounter (INDEPENDENT_AMBULATORY_CARE_PROVIDER_SITE_OTHER): Admitting: Ophthalmology

## 2024-01-15 DIAGNOSIS — E113511 Type 2 diabetes mellitus with proliferative diabetic retinopathy with macular edema, right eye: Secondary | ICD-10-CM | POA: Diagnosis not present

## 2024-01-15 DIAGNOSIS — Z7984 Long term (current) use of oral hypoglycemic drugs: Secondary | ICD-10-CM | POA: Diagnosis not present

## 2024-01-29 ENCOUNTER — Encounter (INDEPENDENT_AMBULATORY_CARE_PROVIDER_SITE_OTHER): Admitting: Ophthalmology

## 2024-01-29 DIAGNOSIS — Z7984 Long term (current) use of oral hypoglycemic drugs: Secondary | ICD-10-CM

## 2024-01-29 DIAGNOSIS — E113511 Type 2 diabetes mellitus with proliferative diabetic retinopathy with macular edema, right eye: Secondary | ICD-10-CM | POA: Diagnosis not present

## 2024-01-29 DIAGNOSIS — H35031 Hypertensive retinopathy, right eye: Secondary | ICD-10-CM

## 2024-01-29 DIAGNOSIS — H43811 Vitreous degeneration, right eye: Secondary | ICD-10-CM

## 2024-01-29 DIAGNOSIS — I10 Essential (primary) hypertension: Secondary | ICD-10-CM

## 2024-02-26 ENCOUNTER — Encounter (INDEPENDENT_AMBULATORY_CARE_PROVIDER_SITE_OTHER): Admitting: Ophthalmology

## 2024-02-26 DIAGNOSIS — Z7984 Long term (current) use of oral hypoglycemic drugs: Secondary | ICD-10-CM | POA: Diagnosis not present

## 2024-02-26 DIAGNOSIS — H35031 Hypertensive retinopathy, right eye: Secondary | ICD-10-CM

## 2024-02-26 DIAGNOSIS — I10 Essential (primary) hypertension: Secondary | ICD-10-CM

## 2024-02-26 DIAGNOSIS — H43811 Vitreous degeneration, right eye: Secondary | ICD-10-CM

## 2024-02-26 DIAGNOSIS — E113511 Type 2 diabetes mellitus with proliferative diabetic retinopathy with macular edema, right eye: Secondary | ICD-10-CM

## 2024-03-25 ENCOUNTER — Encounter (INDEPENDENT_AMBULATORY_CARE_PROVIDER_SITE_OTHER): Admitting: Ophthalmology

## 2024-03-25 DIAGNOSIS — Z7984 Long term (current) use of oral hypoglycemic drugs: Secondary | ICD-10-CM

## 2024-03-25 DIAGNOSIS — E113511 Type 2 diabetes mellitus with proliferative diabetic retinopathy with macular edema, right eye: Secondary | ICD-10-CM

## 2024-03-25 DIAGNOSIS — H35031 Hypertensive retinopathy, right eye: Secondary | ICD-10-CM

## 2024-03-25 DIAGNOSIS — H43811 Vitreous degeneration, right eye: Secondary | ICD-10-CM

## 2024-03-25 DIAGNOSIS — I10 Essential (primary) hypertension: Secondary | ICD-10-CM | POA: Diagnosis not present

## 2024-04-22 ENCOUNTER — Encounter (INDEPENDENT_AMBULATORY_CARE_PROVIDER_SITE_OTHER): Admitting: Ophthalmology

## 2024-04-22 DIAGNOSIS — Z7984 Long term (current) use of oral hypoglycemic drugs: Secondary | ICD-10-CM

## 2024-04-22 DIAGNOSIS — H43811 Vitreous degeneration, right eye: Secondary | ICD-10-CM

## 2024-04-22 DIAGNOSIS — E113511 Type 2 diabetes mellitus with proliferative diabetic retinopathy with macular edema, right eye: Secondary | ICD-10-CM

## 2024-04-22 DIAGNOSIS — I10 Essential (primary) hypertension: Secondary | ICD-10-CM

## 2024-04-22 DIAGNOSIS — H35031 Hypertensive retinopathy, right eye: Secondary | ICD-10-CM

## 2024-05-12 ENCOUNTER — Ambulatory Visit: Admitting: "Endocrinology

## 2024-05-12 ENCOUNTER — Encounter: Payer: Self-pay | Admitting: "Endocrinology

## 2024-05-12 VITALS — BP 120/80 | HR 104 | Ht 68.0 in | Wt 157.0 lb

## 2024-05-12 DIAGNOSIS — E7800 Pure hypercholesterolemia, unspecified: Secondary | ICD-10-CM

## 2024-05-12 DIAGNOSIS — E119 Type 2 diabetes mellitus without complications: Secondary | ICD-10-CM | POA: Diagnosis not present

## 2024-05-12 DIAGNOSIS — Z7984 Long term (current) use of oral hypoglycemic drugs: Secondary | ICD-10-CM

## 2024-05-12 LAB — POCT GLYCOSYLATED HEMOGLOBIN (HGB A1C): Hemoglobin A1C: 5 % (ref 4.0–5.6)

## 2024-05-12 NOTE — Progress Notes (Signed)
 "   Patient ID: Scott Reynolds, male   DOB: [birth year], 83 y.o.   MRN: [id number]   Reason for Appointment:   follow-up of diabetes   History of Present Illness   DIABETES:  He has had diabetes since 2001 which is usually well controlled.  Previously was taking glipizide  and Actoplusmet but after about 2013  he was able to get consistent control with metformin  2000 mg only.  On Metformin  1 g once daily  He has regular eye exams from Dr. Cyrus  Home blood sugars not available  Complications: bilateral leg amputation  No numbness/tingling in hands No heart attack/stroke + history of retinopathy   Wt Readings from Last 3 Encounters:  05/12/24 157 lb (71.2 kg)  01/07/24 157 lb (71.2 kg)  10/02/23 168 lb (76.2 kg)   Lab Results  Component Value Date   HGBA1C 5.0 05/12/2024   HGBA1C 4.8 01/07/2024   HGBA1C 6.5 (A) 10/02/2023   Lab Results  Component Value Date   MICROALBUR 1.0 10/02/2023   LDLCALC 59 10/02/2023   CREATININE 0.84 10/02/2023     HYPERLIPIDEMIA:  Has a history of increased LDL; he has had good control with simvastatin  20 mg Labs previously:   Lab Results  Component Value Date   CHOL 127 10/02/2023   HDL 55 10/02/2023   LDLCALC 59 10/02/2023   TRIG 58 10/02/2023   CHOLHDL 2.3 10/02/2023   HYPERTENSION: See review of systems On losartan  25 mg every day    Allergies as of 05/12/2024   No Known Allergies      Medication List        Accurate as of May 12, 2024  9:07 AM. If you have any questions, ask your nurse or doctor.          STOP taking these medications    metFORMIN  1000 MG tablet Commonly known as: GLUCOPHAGE  Stopped by: Dartha Ernst, MD       TAKE these medications    acetaZOLAMIDE 250 MG tablet Commonly known as: DIAMOX Take 500 mg by mouth 2 (two) times daily.   aspirin  325 MG tablet Take 325 mg by mouth daily.   Blood Glucose Monitoring Suppl Devi 1 each by Does not apply route in the morning, at noon,  and at bedtime. May substitute to any manufacturer covered by patient's insurance.   collagenase 250 UNIT/GM ointment Commonly known as: SANTYL Apply 1 application. topically daily. Cleanse sacrum with n/s Apply santyl. hydrofera blue and secondary dressing daily   dorzolamide  2 % ophthalmic solution Commonly known as: TRUSOPT  Place 1 drop into both eyes 2 (two) times daily.   folic acid  1 MG tablet Commonly known as: FOLVITE  Take 1 tablet (1 mg total) by mouth daily.   Gerhardt's butt cream Crea Apply 1 application. topically 4 (four) times daily.   losartan  25 MG tablet Commonly known as: COZAAR  Take 1 tablet (25 mg total) by mouth daily.   NON FORMULARY Med Pass 120 ml by mouth twice a day for supplement and wound healing   polyethylene glycol 17 g packet Commonly known as: MIRALAX  / GLYCOLAX  Take 17 g by mouth daily as needed.   prednisoLONE  acetate 1 % ophthalmic suspension Commonly known as: PRED FORTE  Place 1 drop into both eyes 4 (four) times daily.   senna-docusate 8.6-50 MG tablet Commonly known as: Senokot-S Take 1 tablet by mouth 2 (two) times daily.   simvastatin  20 MG tablet Commonly known as: ZOCOR  TAKE 1 TABLET BY MOUTH EVERYDAY AT  BEDTIME        Allergies: No Known Allergies  Past Medical History:  Diagnosis Date   Diabetes mellitus without complication (HCC)    Erectile dysfunction    Glaucoma    Hyperlipidemia    Hypertension     Past Surgical History:  Procedure Laterality Date   LEG AMPUTATION ABOVE KNEE Bilateral    2001 left leg and 2003 right leg    Family History  Problem Relation Age of Onset   Diabetes Brother    Cancer Neg Hx     Social History:  reports that he has never smoked. He has never used smokeless tobacco. No history on file for alcohol use and drug use.   ROS Same as above   BP Readings from Last 3 Encounters:  05/12/24 120/80  01/07/24 130/70  10/02/23 (!) 140/80   Lab Results  Component Value Date    CREATININE 0.84 10/02/2023   CREATININE 0.69 09/11/2021   CREATININE 0.79 09/10/2021    Lab Results  Component Value Date   VITAMINB12 2,489 (H) 09/10/2021   Examination:   BP 120/80   Pulse (!) 104   Ht 5' 8 (1.727 m)   Wt 157 lb (71.2 kg)   SpO2 99%   BMI 23.87 kg/m   Body mass index is 23.87 kg/m.   Physical Exam   Physical Exam Constitutional:      Appearance: Normal appearance. Not ill-appearing.  HENT:     Head: Normocephalic and atraumatic.  Eyes:     General: No scleral icterus.    Conjunctiva/sclera: Conjunctivae normal.  Pulmonary:     Effort: Pulmonary effort is normal. No respiratory distress.  Musculoskeletal:     Cervical back: Normal range of motion. No rigidity.  Skin:    Coloration: Skin is not jaundiced.     Findings: No rash.  Neurological:     Mental Status: Alert and oriented to person, place, and time. Psychiatric:        Behavior: Behavior normal.        Judgment: Judgment normal.   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, h/o bilateral leg amputation No MI/stroke/neuropathy/microalbuminuria  His diabetes well controlled on metformin  1 g once a day  Lab Results  Component Value Date   HGBA1C 5.0 05/12/2024   HGBA1C 4.8 01/07/2024   HGBA1C 6.5 (A) 10/02/2023   No symptoms of hypoglycemia He does not monitor at home and not clear if he may have some postprandial high sugars  Was on losartan  25 mg every day On simvastatin  20 mg every day  10/02/23: Sent blood sugar checking supplies, reminded him to check at least if doesn't feel well  01/07/24: given low A1C to 4.8%, decreased metformin  to 1000 mg every day  05/12/24: A1C is 5%, we mutually agree on trial of metformin  halt and reassess A1C in 3  mo 09/2023: LIPIDS: LDL 59  Per prior endocrinologist Dr Von: He still has not called to establish with a new PCP, has been advised to do so several times and has been given a list of available PCPs 10/02/23 Requests all refills, again says has no PCP, last blood work in 2023. Encouraged to find a PCP to get refills on medications other than diabetes medication    Return  in about 16 weeks (around 09/01/2024).   Kegan Shepardson 05/12/2024, 9:07 AM    "

## 2024-05-12 NOTE — Patient Instructions (Signed)

## 2024-05-30 ENCOUNTER — Encounter (INDEPENDENT_AMBULATORY_CARE_PROVIDER_SITE_OTHER): Admitting: Ophthalmology

## 2024-05-30 DIAGNOSIS — H35031 Hypertensive retinopathy, right eye: Secondary | ICD-10-CM

## 2024-05-30 DIAGNOSIS — H43811 Vitreous degeneration, right eye: Secondary | ICD-10-CM

## 2024-05-30 DIAGNOSIS — E113511 Type 2 diabetes mellitus with proliferative diabetic retinopathy with macular edema, right eye: Secondary | ICD-10-CM

## 2024-05-30 DIAGNOSIS — Z7984 Long term (current) use of oral hypoglycemic drugs: Secondary | ICD-10-CM | POA: Diagnosis not present

## 2024-05-30 DIAGNOSIS — I10 Essential (primary) hypertension: Secondary | ICD-10-CM

## 2024-06-26 ENCOUNTER — Encounter (INDEPENDENT_AMBULATORY_CARE_PROVIDER_SITE_OTHER): Admitting: Ophthalmology

## 2024-09-01 ENCOUNTER — Ambulatory Visit: Admitting: "Endocrinology
